# Patient Record
Sex: Male | Born: 1945 | Race: White | Hispanic: No | Marital: Married | Smoking: Former smoker
Health system: Southern US, Community
[De-identification: ages and names within clinical notes are randomized; demographics above are authoritative.]

## PROBLEM LIST (undated history)

## (undated) ENCOUNTER — Emergency Department (HOSPITAL_COMMUNITY): Admission: EM | Payer: Medicare Other | Source: Home / Self Care

## (undated) DIAGNOSIS — I10 Essential (primary) hypertension: Secondary | ICD-10-CM

## (undated) DIAGNOSIS — C801 Malignant (primary) neoplasm, unspecified: Secondary | ICD-10-CM

## (undated) DIAGNOSIS — E7800 Pure hypercholesterolemia, unspecified: Secondary | ICD-10-CM

## (undated) DIAGNOSIS — G8929 Other chronic pain: Secondary | ICD-10-CM

## (undated) DIAGNOSIS — E042 Nontoxic multinodular goiter: Secondary | ICD-10-CM

## (undated) DIAGNOSIS — F419 Anxiety disorder, unspecified: Secondary | ICD-10-CM

## (undated) DIAGNOSIS — I350 Nonrheumatic aortic (valve) stenosis: Secondary | ICD-10-CM

## (undated) DIAGNOSIS — K219 Gastro-esophageal reflux disease without esophagitis: Secondary | ICD-10-CM

## (undated) DIAGNOSIS — M1990 Unspecified osteoarthritis, unspecified site: Secondary | ICD-10-CM

## (undated) DIAGNOSIS — G4730 Sleep apnea, unspecified: Secondary | ICD-10-CM

## (undated) DIAGNOSIS — Z8719 Personal history of other diseases of the digestive system: Secondary | ICD-10-CM

## (undated) DIAGNOSIS — C439 Malignant melanoma of skin, unspecified: Secondary | ICD-10-CM

## (undated) HISTORY — DX: Other chronic pain: G89.29

## (undated) HISTORY — DX: Unspecified osteoarthritis, unspecified site: M19.90

## (undated) HISTORY — DX: Malignant (primary) neoplasm, unspecified: C80.1

## (undated) HISTORY — PX: WRIST SURGERY: SHX841

## (undated) HISTORY — PX: TOTAL KNEE ARTHROPLASTY: SHX125

## (undated) HISTORY — PX: BLADDER SURGERY: SHX569

## (undated) HISTORY — DX: Pure hypercholesterolemia, unspecified: E78.00

## (undated) HISTORY — DX: Essential (primary) hypertension: I10

## (undated) HISTORY — PX: TONSILLECTOMY: SUR1361

## (undated) HISTORY — PX: CATARACT EXTRACTION W/ INTRAOCULAR LENS IMPLANT: SHX1309

## (undated) HISTORY — DX: Gastro-esophageal reflux disease without esophagitis: K21.9

## (undated) HISTORY — DX: Anxiety disorder, unspecified: F41.9

## (undated) HISTORY — PX: SHOULDER SURGERY: SHX246

## (undated) HISTORY — PX: BACK SURGERY: SHX140

## (undated) HISTORY — DX: Malignant melanoma of skin, unspecified: C43.9

## (undated) HISTORY — PX: MELANOMA EXCISION: SHX5266

## (undated) HISTORY — DX: Nonrheumatic aortic (valve) stenosis: I35.0

## (undated) HISTORY — PX: OTHER SURGICAL HISTORY: SHX169

---

## 2001-12-07 ENCOUNTER — Emergency Department (HOSPITAL_COMMUNITY): Admission: EM | Admit: 2001-12-07 | Discharge: 2001-12-07 | Payer: Self-pay | Admitting: Emergency Medicine

## 2010-06-13 ENCOUNTER — Ambulatory Visit (HOSPITAL_COMMUNITY): Admission: RE | Admit: 2010-06-13 | Discharge: 2010-06-13 | Payer: Self-pay | Admitting: Cardiology

## 2010-06-13 ENCOUNTER — Ambulatory Visit: Payer: Self-pay | Admitting: Cardiology

## 2010-11-01 ENCOUNTER — Ambulatory Visit
Admission: RE | Admit: 2010-11-01 | Discharge: 2010-11-02 | Payer: Self-pay | Source: Home / Self Care | Attending: Orthopedic Surgery | Admitting: Orthopedic Surgery

## 2010-11-06 LAB — BASIC METABOLIC PANEL
BUN: 14 mg/dL (ref 6–23)
CO2: 24 mEq/L (ref 19–32)
Calcium: 10.3 mg/dL (ref 8.4–10.5)
Chloride: 105 mEq/L (ref 96–112)
Creatinine, Ser: 0.89 mg/dL (ref 0.4–1.5)
GFR calc Af Amer: >60 mL/min (ref >60)
GFR calc non Af Amer: >60 mL/min (ref >60)
Glucose, Bld: 142 mg/dL — ABNORMAL HIGH (ref 70–99)
Potassium: 4.8 mEq/L (ref 3.5–5.1)
Sodium: 137 mEq/L (ref 135–145)

## 2010-11-06 LAB — POCT HEMOGLOBIN-HEMACUE: Hemoglobin: 16.3 g/dL (ref 13.0–17.0)

## 2010-12-11 NOTE — Op Note (Signed)
NAME:  Steve Hunter, Steve Hunter              ACCOUNT NO.:  [id number]  MEDICAL RECORD NO.:  [id number]          PATIENT TYPE:  AMB  LOCATION:  DSC                          FACILITY:  [facility]  PHYSICIAN:  Cindee Salt, M.D.       DATE OF BIRTH:  1945/11/28  DATE OF PROCEDURE:  11/01/2010 DATE OF DISCHARGE:                              OPERATIVE REPORT   PREOPERATIVE DIAGNOSES: 1. Carpal tunnel syndrome, right hand. 2. Carpometacarpal arthritis, right thumb.  POSTOPERATIVE DIAGNOSES: 1. Carpal tunnel syndrome, right hand. 2. Carpometacarpal arthritis, right thumb.  OPERATION: 1. GraftJacket interposition. 2. Arthroscopic partial trapeziectomy, right thumb carpometacarpal     with open carpal tunnel release.  SURGEON:  Cindee Salt, M.D.  ANESTHESIA:  General.  ANESTHESIOLOGIST:  Zenon Mayo, MD.  HISTORY:  The patient is a 65 year old male with a history of pain in the basal area of the thumb, not responsive to conservative treatment, carpal tunnel syndrome with numbness and tingling, EMG and nerve conductions positive, also nonresponsive to conservative treatment.  He has elected to undergo partial episiotomy with GraftJacket interposition with open carpal tunnel release.  He is aware that there is no guarantee with surgery, possibility of infection; recurrence of injury to arteries, nerves, tendons; incomplete relief of symptoms and dystrophy. In the preoperative area, the patient was seen.  The extremity marked by both patient and surgeon.  Antibiotic given.  PROCEDURE:  The patient was brought to the operating room where a general anesthetic was carried out without difficulty.  Was prepped using ChloraPrep, supine position with the right arm free.  A 3-minute dry time was allowed.  Time-out taken, confirming patient and procedure. He was placed in the Concept arthroscopy tower with traction on the thumb, 5 pounds applied.  The joint was localized with  image intensification.  A 22-gauge needle was used to inflate the joint on either side of the abductor pollicis longus and extensor brevis tendons. A longitudinal incision was made, deepened with a hemostat.  Blunt trocar used to enter the carpometacarpal joint.  The scope and instruments were alternated between the radial and ulnar portals.  A 1.9- mm scope and a 2-mm shaver were introduced.  This was then debrided of loose cartilage.  Significant degenerative changes were noted with loss of the saddle deformity to the trapezium and total loss of the cartilage.  Multiple cartilage fragments were floating within the joint, these were debrided.  The margins of the trapezium were then isolated. A vortex bur was then introduced, and a partial trapeziectomy was performed, taking care to eliminate the large osteophyte between the thumb and index metacarpal.  This required use of small osteotomes, the bur, and shaver alternating until this was visualized under image intensification with adequate resection.  A 2 x 4 mm GraftJacket was then folded, this was then placed into the joint, unraveled, filling the joint with the GraftJacket.  X-rays confirmed positioning of the joint with adequate interposition, with the traction removed.  The portals were then closed with interrupted 5-0 Vicryl Rapide sutures.  The limb was then exsanguinated with an Esmarch bandage, tourniquet placed high on the arm,  was inflated to 250 mmHg.  A longitudinal incision was made and promptly carried down through subcutaneous tissue.  Bleeders were electrocauterized.  Palmar fascia was split, superficial palmar arch identified.  The flexor tendon to the ring little finger identified to the ulnar side of median nerve.  The carpal retinaculum was incised with sharp dissection.  A right-angle and Sewell retractor were placed between skin and forearm fascia.  Fascia was then released for approximately a centimeter and half  proximal to the wrist crease under direct vision.  The canal was explored. Compression to the nerve was apparent.  No further lesions were identified.  Very significant tenosynovitis was present.  The wound was copiously irrigated with saline.  The skin then closed with interrupted 5-0 Vicryl Rapide sutures.  A local infiltration with 0.25% Marcaine was then given to each incision.  The arm was then placed in a dorsal-palmar-radial thumb spica splint.  On deflation of the tourniquet, all fingers immediately pinked.  He was taken to the recovery room for observation in satisfactory condition.  He will be discharged home.  He will return to the [facility] in 1 week, on Percocet.  He is admitted for overnight stay, for pain control; he will be discharged if he is comfortable.          ______________________________ Cindee Salt, M.D.     GK/MEDQ  D:  11/01/2010  T:  11/02/2010  Job:  [id number]  Electronically Signed by Cindee Salt M.D. on 12/11/2010 02:22:58 PM

## 2011-07-03 ENCOUNTER — Encounter: Payer: Self-pay | Admitting: Cardiology

## 2011-07-13 ENCOUNTER — Ambulatory Visit (INDEPENDENT_AMBULATORY_CARE_PROVIDER_SITE_OTHER): Payer: Medicare Other | Admitting: Cardiology

## 2011-07-13 ENCOUNTER — Encounter: Payer: Self-pay | Admitting: Cardiology

## 2011-07-13 VITALS — BP 130/70 | HR 63 | Ht 73.0 in | Wt 226.8 lb

## 2011-07-13 DIAGNOSIS — I350 Nonrheumatic aortic (valve) stenosis: Secondary | ICD-10-CM | POA: Insufficient documentation

## 2011-07-13 DIAGNOSIS — E119 Type 2 diabetes mellitus without complications: Secondary | ICD-10-CM

## 2011-07-13 DIAGNOSIS — I10 Essential (primary) hypertension: Secondary | ICD-10-CM

## 2011-07-13 DIAGNOSIS — I359 Nonrheumatic aortic valve disorder, unspecified: Secondary | ICD-10-CM

## 2011-07-13 DIAGNOSIS — E7800 Pure hypercholesterolemia, unspecified: Secondary | ICD-10-CM

## 2011-07-13 NOTE — Assessment & Plan Note (Signed)
He has mild aortic stenosis by prior echocardiogram. His exam is unchanged. We'll plan on repeating an echocardiogram in about 2 years. He will continue on his current medications. SBE prophylaxis is not required.

## 2011-07-13 NOTE — Patient Instructions (Signed)
Continue your current therapy  I will see you again in 1 year.   

## 2011-07-13 NOTE — Progress Notes (Signed)
Steve Hunter Date of Birth: 1946/08/09   History of Present Illness: He has a history of mild aortic stenosis. His last echocardiogram was in August of 2011. He has done very well this past year. He denies any cardiac complaints. He denies any chest pain, shortness of breath, or palpitations.  Current Outpatient Prescriptions on File Prior to Visit  Medication Sig Dispense Refill  . aspirin 81 MG tablet Take 81 mg by mouth daily.        Marland Kitchen esomeprazole (NEXIUM) 40 MG capsule Take 40 mg by mouth daily.        . finasteride (PROSCAR) 5 MG tablet Take 5 mg by mouth daily.        Marland Kitchen gemfibrozil (LOPID) 600 MG tablet Take 600 mg by mouth 2 (two) times daily.        Marland Kitchen HYDROcodone-acetaminophen (VICODIN) 5-500 MG per tablet Take 1 tablet by mouth as needed.        Marland Kitchen losartan-hydrochlorothiazide (HYZAAR) 100-25 MG per tablet Take 1 tablet by mouth daily.        . meloxicam (MOBIC) 15 MG tablet Take 15 mg by mouth daily.        . pantoprazole (PROTONIX) 40 MG tablet Take 1 tablet by mouth Daily.      . simvastatin (ZOCOR) 80 MG tablet Take 80 mg by mouth daily.        . Tamsulosin HCl (FLOMAX) 0.4 MG CAPS Take by mouth daily.        . traMADol (ULTRAM) 50 MG tablet Take 50 mg by mouth daily.          No Known Allergies  Past Medical History  Diagnosis Date  . Diabetes mellitus   . Hypertension   . Hypercholesterolemia   . Aortic stenosis     Mild  . GERD (gastroesophageal reflux disease)   . Cancer     Bladder  . Melanoma   . Osteoarthritis     Past Surgical History  Procedure Date  . Bladder surgery   . Knee surgery     Left knee  . Wrist surgery     Right wrist  . Melanoma excision   . Carpal tunnel surgery     History  Smoking status  . Former Smoker  . Quit date: 07/02/1980  Smokeless tobacco  . Not on file    History  Alcohol Use  . Yes    Family History  Problem Relation Age of Onset  . Hypertension Mother     Review of Systems: The review of  systems is positive for severe osteoarthritis. This has significantly limited his activity. He had carpal tunnels surgery this year. He states he will need a knee replacement at some point. All other systems were reviewed and are negative.  Physical Exam: BP 130/70  Pulse 63  Ht 6\' 1"  (1.854 m)  Wt 226 lb 12.8 oz (102.876 kg)  BMI 29.92 kg/m2 The patient is alert and oriented x 3.  The mood and affect are normal.  The skin is warm and dry.  Color is normal.  The HEENT exam reveals that the sclera are nonicteric.  The mucous membranes are moist.  The carotids are 2+ without bruits.  There is no thyromegaly.  There is no JVD.  The lungs are clear.  The chest wall is non tender.  The heart exam reveals a regular rate with a normal S1 and S2.  There is a grade 1-2/6 systolic ejection murmur the  right upper sternal border..  The PMI is not displaced.   Abdominal exam reveals good bowel sounds.  There is no guarding or rebound.  There is no hepatosplenomegaly or tenderness.  There are no masses.  Exam of the legs reveal no clubbing, cyanosis, or edema.  The legs are without rashes.  The distal pulses are intact.  Cranial nerves II - XII are intact.  Motor and sensory functions are intact.  The gait is normal.  LABORATORY DATA:   Assessment / Plan:

## 2012-05-28 ENCOUNTER — Telehealth: Payer: Self-pay | Admitting: Cardiology

## 2012-05-28 ENCOUNTER — Other Ambulatory Visit: Payer: Self-pay

## 2012-05-28 DIAGNOSIS — I350 Nonrheumatic aortic (valve) stenosis: Secondary | ICD-10-CM

## 2012-05-28 DIAGNOSIS — I10 Essential (primary) hypertension: Secondary | ICD-10-CM

## 2012-05-28 DIAGNOSIS — E119 Type 2 diabetes mellitus without complications: Secondary | ICD-10-CM

## 2012-05-28 NOTE — Telephone Encounter (Signed)
New problem:  Patient calling would like to be set up for stress test

## 2012-05-28 NOTE — Telephone Encounter (Signed)
Patient's wife called was told spoke to Dr.Jordan he advised lexiscan myoview.Schedulers will call tomorrow 05/29/12 to schedule.

## 2012-05-29 ENCOUNTER — Telehealth: Payer: Self-pay | Admitting: *Deleted

## 2012-05-29 NOTE — Telephone Encounter (Signed)
Left message for patient to call to schedule myoview per Dr. Swaziland.

## 2012-06-04 ENCOUNTER — Ambulatory Visit (HOSPITAL_COMMUNITY): Payer: Medicare Other | Attending: Cardiovascular Disease | Admitting: Radiology

## 2012-06-04 VITALS — BP 120/62 | Ht 73.0 in | Wt 205.0 lb

## 2012-06-04 DIAGNOSIS — R42 Dizziness and giddiness: Secondary | ICD-10-CM | POA: Insufficient documentation

## 2012-06-04 DIAGNOSIS — I350 Nonrheumatic aortic (valve) stenosis: Secondary | ICD-10-CM

## 2012-06-04 DIAGNOSIS — R21 Rash and other nonspecific skin eruption: Secondary | ICD-10-CM

## 2012-06-04 DIAGNOSIS — R079 Chest pain, unspecified: Secondary | ICD-10-CM | POA: Insufficient documentation

## 2012-06-04 DIAGNOSIS — Z87891 Personal history of nicotine dependence: Secondary | ICD-10-CM | POA: Insufficient documentation

## 2012-06-04 DIAGNOSIS — M79671 Pain in right foot: Secondary | ICD-10-CM

## 2012-06-04 DIAGNOSIS — E119 Type 2 diabetes mellitus without complications: Secondary | ICD-10-CM | POA: Insufficient documentation

## 2012-06-04 DIAGNOSIS — I10 Essential (primary) hypertension: Secondary | ICD-10-CM | POA: Insufficient documentation

## 2012-06-04 DIAGNOSIS — R002 Palpitations: Secondary | ICD-10-CM | POA: Insufficient documentation

## 2012-06-04 MED ORDER — AMINOPHYLLINE 25 MG/ML IV SOLN
75.0000 mg | Freq: Once | INTRAVENOUS | Status: AC
  Administered 2012-06-04: 75 mg via INTRAVENOUS

## 2012-06-04 MED ORDER — TECHNETIUM TC 99M TETROFOSMIN IV KIT
30.0000 | PACK | Freq: Once | INTRAVENOUS | Status: AC | PRN
  Administered 2012-06-04: 30 via INTRAVENOUS

## 2012-06-04 MED ORDER — REGADENOSON 0.4 MG/5ML IV SOLN
0.4000 mg | Freq: Once | INTRAVENOUS | Status: AC
  Administered 2012-06-04: 0.4 mg via INTRAVENOUS

## 2012-06-04 MED ORDER — TECHNETIUM TC 99M TETROFOSMIN IV KIT
10.0000 | PACK | Freq: Once | INTRAVENOUS | Status: AC | PRN
  Administered 2012-06-04: 10 via INTRAVENOUS

## 2012-06-04 MED ORDER — DIPHENHYDRAMINE HCL 50 MG/ML IJ SOLN
25.0000 mg | Freq: Once | INTRAMUSCULAR | Status: AC
  Administered 2012-06-04: 25 mg via INTRAVENOUS

## 2012-06-04 NOTE — Progress Notes (Signed)
[facility] SITE 3 NUCLEAR MED [address] [place] [address] [phone]  Cardiology Nuclear Med Study  Steve Hunter is a 66 y.o. male     MRN : [id number]     DOB: [birth year]  Procedure Date: 06/04/2012  Nuclear Med Background Indication for Stress Test:  Evaluation for Ischemia History:  No prior known history of CAD, 4 to 5 yrs ago GXT per patient, and '11 Echo: Result not available Cardiac Risk Factors: History of Smoking, Hypertension, Lipids and NIDDM  Symptoms: Chest Pain with/without exertion (last occurrence 1 month ago),  Dizziness, Light-Headedness and Palpitations   Nuclear Pre-Procedure Caffeine/Decaff Intake:  None NPO After: 8:00am   Lungs:  clear O2 Sat: 96% on room air. IV 0.9% NS with Angio Cath:  20g  IV Site: R Antecubital  IV Started by:  Stanton Kidney, EMT-P  Chest Size (in):  42 Cup Size: n/a  Height: 6\' 1"  (1.854 m)  Weight:  205 lb (92.987 kg)  BMI:  Body mass index is 27.05 kg/(m^2). Tech Comments: The patient complained of persistent tingling and burning in feet, and developed a rash like mosquito bites on chest , back, and (L) knee with itching between fingers post Lexiscan. Aminophylline 75mg  IVP given at 1:56pm with rapid relief of feet pain. Benadryl 25mg  IVP given at 2:00pm per orders by Sunday Spillers with relief of rash symptoms.Irean Hong, RN    Nuclear Med Study 1 or 2 day study: 1 day  Stress Test Type:  Treadmill/Lexiscan  Reading MD: Charlton Haws, MD  Order Authorizing Provider:  Peter Swaziland, MD  Resting Radionuclide: Technetium 79m Tetrofosmin  Resting Radionuclide Dose: 11.0 mCi   Stress Radionuclide:  Technetium 2m Tetrofosmin  Stress Radionuclide Dose: 33.0 mCi           Stress Protocol Rest HR: 58 Stress HR: 118  Rest BP: 120/62 Stress BP: 157/73  Exercise Time (min): 2:00 METS: n/a   Predicted Max HR: 153 bpm % Max HR: 77.12 bpm Rate Pressure Product: 21308   Dose of Adenosine (mg):  n/a Dose  of Lexiscan: 0.4 mg  Dose of Atropine (mg): n/a Dose of Dobutamine: n/a mcg/kg/min (at max HR)  Stress Test Technologist: Irean Hong, RN  Nuclear Technologist:  Domenic Polite, CNMT     Rest Procedure:  Myocardial perfusion imaging was performed at rest 45 minutes following the intravenous administration of Technetium 89m Tetrofosmin. Rest ECG: Sinus bradycardia  Stress Procedure:  The patient received IV Lexiscan 0.4 mg over 15-seconds with concurrent low level exercise and then Technetium 75m Tetrofosmin was injected at 30-seconds while the patient continued walking one more minute. There were no significant changes with Lexiscan. Quantitative spect images were obtained after a 45-minute delay. The patient was asymptomatic after 2nd images with an okay for patient to be discharged by Norma Fredrickson, NP with wife to be the driver. Stress ECG: No significant change from baseline ECG  QPS Raw Data Images:  Normal; no motion artifact; normal heart/lung ratio. Stress Images:  Normal homogeneous uptake in all areas of the myocardium. Rest Images:  Normal homogeneous uptake in all areas of the myocardium. Subtraction (SDS):  No evidence of ischemia. Transient Ischemic Dilatation (Normal <1.22):  1.05 Lung/Heart Ratio (Normal <0.45):  .29  Quantitative Gated Spect Images QGS EDV:  131 ml QGS ESV:  59 ml  Impression Exercise Capacity:  Lexiscan with low level exercise. BP Response:  Normal blood pressure response. Clinical Symptoms:  No significant symptoms noted.  ECG Impression:  No significant ST segment change suggestive of ischemia. Comparison with Prior Nuclear Study: No previous nuclear study performed  Overall Impression:  Normal stress nuclear study.  No evidence of ischemia.  Normal LV function.  LV Ejection Fraction: 55%.  LV Wall Motion:  NL LV Function; NL Wall Motion    Vesta Mixer, Montez Hageman., MD, [facility] 06/04/2012, 5:15 PM Office - [phone] Pager [phone]

## 2012-06-11 ENCOUNTER — Telehealth: Payer: Self-pay | Admitting: Cardiology

## 2012-06-11 NOTE — Telephone Encounter (Signed)
Patient called was told normal myoview.

## 2012-06-11 NOTE — Telephone Encounter (Signed)
Pt rtn call to cheryl from yesterday °

## 2012-07-10 ENCOUNTER — Ambulatory Visit (INDEPENDENT_AMBULATORY_CARE_PROVIDER_SITE_OTHER): Payer: Medicare Other | Admitting: Cardiology

## 2012-07-10 ENCOUNTER — Encounter: Payer: Self-pay | Admitting: Cardiology

## 2012-07-10 VITALS — BP 110/70 | HR 63 | Ht 74.0 in | Wt 208.0 lb

## 2012-07-10 DIAGNOSIS — I10 Essential (primary) hypertension: Secondary | ICD-10-CM

## 2012-07-10 DIAGNOSIS — R0789 Other chest pain: Secondary | ICD-10-CM

## 2012-07-10 DIAGNOSIS — I350 Nonrheumatic aortic (valve) stenosis: Secondary | ICD-10-CM

## 2012-07-10 DIAGNOSIS — I359 Nonrheumatic aortic valve disorder, unspecified: Secondary | ICD-10-CM

## 2012-07-10 NOTE — Patient Instructions (Signed)
Continue your current therapy  I will see you again in one year.   

## 2012-07-10 NOTE — Progress Notes (Signed)
Steve Hunter Date of Birth: [birth year]   History of Present Illness: [name] is seen today for yearly followup. He has a history of mild aortic stenosis. His last echocardiogram was in August of 2011. Over the past several months he had had some minor chest pain. This was fairly fleeting pain running across his chest. It was not clearly related to exertion. It has resolved. He did undergo left total knee replacement in January. Since then he has had more back problems and has a herniated disc. He is scheduled to see neurosurgery next week. He has been watching his diet more carefully and has lost 18 pounds. Because of his chest pain he recently underwent LexiScan Myoview study which was normal. He did have an allergic reaction following this study with a diffuse rash, presumably from the LexiScan.  Current Outpatient Prescriptions on File Prior to Visit  Medication Sig Dispense Refill  . aspirin 81 MG tablet Take 81 mg by mouth daily.        . finasteride (PROSCAR) 5 MG tablet Take 5 mg by mouth daily.        Marland Kitchen gemfibrozil (LOPID) 600 MG tablet Take 600 mg by mouth 2 (two) times daily.        Marland Kitchen HYDROcodone-acetaminophen (VICODIN) 5-500 MG per tablet Take 1 tablet by mouth as needed.        Marland Kitchen losartan-hydrochlorothiazide (HYZAAR) 100-25 MG per tablet Take 1 tablet by mouth daily.        . meloxicam (MOBIC) 15 MG tablet Take 15 mg by mouth daily.        . pantoprazole (PROTONIX) 40 MG tablet Take 1 tablet by mouth Daily.      . simvastatin (ZOCOR) 80 MG tablet Take 40 mg by mouth daily.       . Tamsulosin HCl (FLOMAX) 0.4 MG CAPS Take by mouth daily.        . traMADol (ULTRAM) 50 MG tablet Take 50 mg by mouth daily. As needed      . zolpidem (AMBIEN) 10 MG tablet as directed.        Allergies  Allergen Reactions  . Lexiscan (Regadenoson) Itching and Rash    Patient developed mosquite bite type rash on chest, back and leg post lexiscan.    Past Medical History  Diagnosis Date  .  Diabetes mellitus   . Hypertension   . Hypercholesterolemia   . Aortic stenosis     Mild  . GERD (gastroesophageal reflux disease)   . Cancer     Bladder  . Melanoma   . Osteoarthritis     Past Surgical History  Procedure Date  . Bladder surgery   . Wrist surgery     Right wrist  . Melanoma excision   . Carpal tunnel surgery   . Total knee arthroplasty     left    History  Smoking status  . Former Smoker  . Quit date: 07/02/1980  Smokeless tobacco  . Not on file    History  Alcohol Use  . Yes    Family History  Problem Relation Age of Onset  . Hypertension Mother     Review of Systems: The review of systems is positive for severe osteoarthritis.  All other systems were reviewed and are negative.  Physical Exam: BP 110/70  Pulse 63  Ht 6\' 2"  (1.88 m)  Wt 208 lb (94.348 kg)  BMI 26.71 kg/m2 He is a pleasant white male in no acute distress. The  skin is warm and dry.  Color is normal.  The HEENT exam reveals that the sclera are nonicteric.  The mucous membranes are moist.  The carotids are 2+ without bruits.  There is no thyromegaly.  There is no JVD.  The lungs are clear.  The chest wall is non tender.  The heart exam reveals a regular rate with a normal S1 and S2.  There is a grade 1-2/6 systolic ejection murmur the right upper sternal border..  The PMI is not displaced.   Abdominal exam reveals good bowel sounds.  There is no guarding or rebound.  There is no hepatosplenomegaly or tenderness.  There are no masses.  Exam of the legs reveal no clubbing, cyanosis, or edema.  The distal pulses are intact.  Cranial nerves II - XII are intact.  Motor and sensory functions are intact.  The gait is normal.  LABORATORY DATA: ECG today demonstrates normal sinus rhythm with left axis deviation. It is otherwise normal.  Assessment / Plan: 1. Atypical chest pain. He has a normal Myoview study. We will continue with risk factor modification.  2. Mild aortic stenosis. We  will just follow up again in one year.  3. Hypertension, controlled.  4. Hyperlipidemia, on a combination of simvastatin and gemfibrozil.  5. Diabetes mellitus type 2. I've encouraged him with his weight loss efforts.

## 2012-07-15 ENCOUNTER — Ambulatory Visit: Payer: Medicare Other | Admitting: Cardiology

## 2012-12-31 ENCOUNTER — Telehealth: Payer: Self-pay | Admitting: Cardiology

## 2012-12-31 NOTE — Telephone Encounter (Signed)
Patient called spoke to wife she stated husband's last stress test he had done he was allergic and wanted to know name.Steve Hunter is name of last myoview and he had allergic reaction itching,rash.

## 2012-12-31 NOTE — Telephone Encounter (Signed)
Pt had nuc stress and was allergic to something he was injected with, thinks it started with an L, needs to know the name of it pls

## 2013-08-20 DIAGNOSIS — C679 Malignant neoplasm of bladder, unspecified: Secondary | ICD-10-CM | POA: Insufficient documentation

## 2014-06-18 ENCOUNTER — Encounter: Payer: Self-pay | Admitting: Cardiology

## 2014-06-18 ENCOUNTER — Ambulatory Visit (INDEPENDENT_AMBULATORY_CARE_PROVIDER_SITE_OTHER): Payer: Medicare Other | Admitting: Cardiology

## 2014-06-18 VITALS — BP 114/62 | HR 64 | Ht 73.0 in | Wt 207.7 lb

## 2014-06-18 DIAGNOSIS — I10 Essential (primary) hypertension: Secondary | ICD-10-CM

## 2014-06-18 DIAGNOSIS — I350 Nonrheumatic aortic (valve) stenosis: Secondary | ICD-10-CM

## 2014-06-18 DIAGNOSIS — I359 Nonrheumatic aortic valve disorder, unspecified: Secondary | ICD-10-CM

## 2014-06-18 DIAGNOSIS — E7800 Pure hypercholesterolemia, unspecified: Secondary | ICD-10-CM

## 2014-06-18 DIAGNOSIS — E119 Type 2 diabetes mellitus without complications: Secondary | ICD-10-CM

## 2014-06-18 NOTE — Patient Instructions (Signed)
Continue your current therapy  I will get a copy of your lab work  Continue to exercise regularly.

## 2014-06-18 NOTE — Progress Notes (Signed)
[name] Date of Birth: March 31, 1946   History of Present Illness: [name] is seen today for yearly followup. He has a history of mild aortic stenosis. His last echocardiogram was in August of 2011. He has a  LexiScan Myoview study in 2013 which was normal. He did have an allergic reaction following this study with a diffuse rash, presumably from the [place]. He states he has been diagnosed with DM. He is on oral therapy. He states he has lost 30 lbs and is feeling much better. Denies chest pain or SOB. Is following a low carb diet. Did have some dizziness with sugars were out of control but this has resolved.  Current Outpatient Prescriptions on File Prior to Visit  Medication Sig Dispense Refill  . aspirin 81 MG tablet Take 81 mg by mouth daily.        . finasteride (PROSCAR) 5 MG tablet Take 5 mg by mouth daily.        Marland Kitchen gemfibrozil (LOPID) 600 MG tablet Take 600 mg by mouth 2 (two) times daily.        Marland Kitchen HYDROcodone-acetaminophen (VICODIN) 5-500 MG per tablet Take 1 tablet by mouth as needed.        Marland Kitchen losartan-hydrochlorothiazide (HYZAAR) 100-25 MG per tablet Take 1 tablet by mouth daily.        . meloxicam (MOBIC) 15 MG tablet Take 15 mg by mouth daily.        . pantoprazole (PROTONIX) 40 MG tablet Take 1 tablet by mouth Daily.      . Tamsulosin HCl (FLOMAX) 0.4 MG CAPS Take by mouth daily.        . traMADol (ULTRAM) 50 MG tablet Take 50 mg by mouth daily. As needed      . zolpidem (AMBIEN) 10 MG tablet as directed.       No current facility-administered medications on file prior to visit.    Allergies  Allergen Reactions  . Lexiscan [Regadenoson] Itching and Rash    Patient developed mosquite bite type rash on chest, back and leg post lexiscan.    Past Medical History  Diagnosis Date  . Diabetes mellitus   . Hypertension   . Hypercholesterolemia   . Aortic stenosis     Mild  . GERD (gastroesophageal reflux disease)   . Cancer     Bladder  . Melanoma   .  Osteoarthritis     Past Surgical History  Procedure Laterality Date  . Bladder surgery    . Wrist surgery      Right wrist  . Melanoma excision    . Carpal tunnel surgery    . Total knee arthroplasty      left    History  Smoking status  . Former Smoker  . Quit date: 07/02/1980  Smokeless tobacco  . Not on file    History  Alcohol Use  . Yes    Family History  Problem Relation Age of Onset  . Hypertension Mother     Review of Systems: As noted in HPI.  All other systems were reviewed and are negative.  Physical Exam: BP 114/62  Pulse 64  Ht 6\' 1"  (1.854 m)  Wt 207 lb 11.2 oz (94.212 kg)  BMI 27.41 kg/m2 He is a pleasant white male in no acute distress. The skin is warm and dry.  Color is normal.  The HEENT exam reveals that the sclera are nonicteric.  The mucous membranes are moist.  The carotids are 2+  without bruits.  There is no thyromegaly.  There is no JVD.  The lungs are clear.   The heart exam reveals a regular rate with a normal S1 and S2.  There is a grade 1/6 systolic ejection murmur the right upper sternal border..  The PMI is not displaced.   Abdominal exam reveals good bowel sounds.  There is no hepatosplenomegaly or tenderness.  There are no masses.  Exam of the legs reveal no clubbing, cyanosis, or edema.  The distal pulses are intact.  Cranial nerves II - XII are intact.  Motor and sensory functions are intact.  The gait is normal.  LABORATORY DATA: ECG today demonstrates normal sinus rhythm with normal Ecg.  Assessment / Plan: 1. History of atypical chest pain. He had a normal Myoview study. Symptoms resolved.  2. Mild aortic stenosis. We will just follow up again in one year.  3. Hypertension, controlled.  4. Hyperlipidemia, on a combination of simvastatin and gemfibrozil.  5. Diabetes mellitus type 2. On oral therapy with improvement.  Continue risk factor modification. Follow up in one year.

## 2015-06-24 ENCOUNTER — Ambulatory Visit (INDEPENDENT_AMBULATORY_CARE_PROVIDER_SITE_OTHER): Payer: PPO | Admitting: Cardiology

## 2015-06-24 ENCOUNTER — Encounter: Payer: Self-pay | Admitting: Cardiology

## 2015-06-24 VITALS — BP 104/64 | HR 69 | Ht 73.5 in | Wt 217.9 lb

## 2015-06-24 DIAGNOSIS — R0789 Other chest pain: Secondary | ICD-10-CM

## 2015-06-24 DIAGNOSIS — E7800 Pure hypercholesterolemia, unspecified: Secondary | ICD-10-CM

## 2015-06-24 DIAGNOSIS — I10 Essential (primary) hypertension: Secondary | ICD-10-CM | POA: Diagnosis not present

## 2015-06-24 DIAGNOSIS — I350 Nonrheumatic aortic (valve) stenosis: Secondary | ICD-10-CM | POA: Diagnosis not present

## 2015-06-24 NOTE — Progress Notes (Signed)
Steve Hunter: 18-Mar-1946   History of Present Illness: [name] is seen today for follow up. He has a history of mild aortic stenosis. His last echocardiogram was in August of 2011. He has a  LexiScan Myoview study in 2013 which was normal. He did have an allergic reaction following this study with a diffuse rash, presumably from the [place]. He has a history of HTN, DM, and HL. Denies chest pain or SOB. Is following a low carb diet. Complains of leg weakness and back pain. Worse in early am and with walking. Not able to walk as much due to pain in right knee- prior TKR.  Current Outpatient Prescriptions on File Prior to Visit  Medication Sig Dispense Refill  . aspirin 81 MG tablet Take 81 mg by mouth daily.      Marland Kitchen atorvastatin (LIPITOR) 40 MG tablet Take 40 mg by mouth daily.    . finasteride (PROSCAR) 5 MG tablet Take 5 mg by mouth daily.      Marland Kitchen gemfibrozil (LOPID) 600 MG tablet Take 600 mg by mouth 2 (two) times daily.      Marland Kitchen HYDROcodone-acetaminophen (VICODIN) 5-500 MG per tablet Take 1 tablet by mouth as needed.      Marland Kitchen losartan-hydrochlorothiazide (HYZAAR) 100-25 MG per tablet Take 1 tablet by mouth daily.      . meloxicam (MOBIC) 15 MG tablet Take 15 mg by mouth daily.      . metFORMIN (GLUCOPHAGE) 500 MG tablet Take 1,000 mg by mouth daily with supper.    . pantoprazole (PROTONIX) 40 MG tablet Take 1 tablet by mouth Daily.    . Tamsulosin HCl (FLOMAX) 0.4 MG CAPS Take by mouth daily.      Marland Kitchen zolpidem (AMBIEN) 10 MG tablet as directed.     No current facility-administered medications on file prior to visit.    Allergies  Allergen Reactions  . Lexiscan [Regadenoson] Itching and Rash    Patient developed mosquite bite type rash on chest, back and leg post lexiscan.    Past Medical History  Diagnosis Date  . Diabetes mellitus   . Hypertension   . Hypercholesterolemia   . Aortic stenosis     Mild  . GERD (gastroesophageal reflux disease)   . Cancer    Bladder  . Melanoma   . Osteoarthritis     Past Surgical History  Procedure Laterality Date  . Bladder surgery    . Wrist surgery      Right wrist  . Melanoma excision    . Carpal tunnel surgery    . Total knee arthroplasty      left    History  Smoking status  . Former Smoker  . Quit date: 07/02/1980  Smokeless tobacco  . Not on file    History  Alcohol Use  . Yes    Family History  Problem Relation Age of Onset  . Hypertension Mother     Review of Systems: As noted in HPI.  All other systems were reviewed and are negative.  Physical Exam: BP 104/64 mmHg  Pulse 69  Ht 6' 1.5" (1.867 m)  Wt 98.839 kg (217 lb 14.4 oz)  BMI 28.36 kg/m2 He is a pleasant white male in no acute distress. The skin is warm and dry.  Color is normal.  The HEENT exam reveals that the sclera are nonicteric.  The mucous membranes are moist.  The carotids are 2+ without bruits.  There is no thyromegaly.  There is no JVD.  The lungs are clear.   The heart exam reveals a regular rate with a normal S1 and S2.  There is a grade 1/6 systolic ejection murmur the right upper sternal border.  The PMI is not displaced.   Abdominal exam reveals good bowel sounds.  There is no hepatosplenomegaly or tenderness.  There are no masses.  Exam of the legs reveal no clubbing, cyanosis, or edema.  The distal pulses are good.  Cranial nerves II - XII are intact.  Motor and sensory functions are intact.  The gait is normal.  LABORATORY DATA: ECG today demonstrates normal sinus rhythm with LAD. Otherwise normal. I have personally reviewed and interpreted this study.   Assessment / Plan: 1.  Mild aortic stenosis. Last evaluation 5 yrs ago. Will update Echo.  3. Hypertension, controlled.  4. Hyperlipidemia, on a combination of simvastatin and gemfibrozil. Labs followed by primary care.   5. Diabetes mellitus type 2. On oral therapy with improvement.  Continue risk factor modification. Follow up in one year.

## 2015-06-24 NOTE — Patient Instructions (Signed)
We will schedule you for an Echocardiogram  Continue your other therapy  I will see you in one year.

## 2015-06-29 ENCOUNTER — Ambulatory Visit: Payer: PPO

## 2015-06-29 ENCOUNTER — Ambulatory Visit (INDEPENDENT_AMBULATORY_CARE_PROVIDER_SITE_OTHER): Payer: PPO | Admitting: Podiatry

## 2015-06-29 ENCOUNTER — Encounter: Payer: Self-pay | Admitting: Podiatry

## 2015-06-29 ENCOUNTER — Ambulatory Visit (INDEPENDENT_AMBULATORY_CARE_PROVIDER_SITE_OTHER): Payer: PPO

## 2015-06-29 VITALS — BP 129/81 | HR 76 | Resp 16

## 2015-06-29 DIAGNOSIS — M79674 Pain in right toe(s): Secondary | ICD-10-CM

## 2015-06-29 DIAGNOSIS — M79675 Pain in left toe(s): Secondary | ICD-10-CM

## 2015-06-29 DIAGNOSIS — M779 Enthesopathy, unspecified: Secondary | ICD-10-CM | POA: Diagnosis not present

## 2015-06-29 DIAGNOSIS — L84 Corns and callosities: Secondary | ICD-10-CM | POA: Diagnosis not present

## 2015-06-29 NOTE — Progress Notes (Signed)
   Subjective:    Patient ID: Steve Hunter, male    DOB: December 01, 1945, 69 y.o.   MRN: [id number]  HPI Pt presents with painful corn interdigital 4th and 5th toe. Also c/o thickening left great nail, has tried lamisil topical   Review of Systems  Gastrointestinal: Positive for abdominal distention.  Musculoskeletal: Positive for back pain, arthralgias and gait problem.  All other systems reviewed and are negative.      Objective:   Physical Exam        Assessment & Plan:

## 2015-06-29 NOTE — Progress Notes (Signed)
Subjective:     Patient ID: Steve Hunter, male   DOB: 06-12-1946, 69 y.o.   MRN: [id number]  HPI patient presents stating I have a lot of pain between the fourth and fifth toes on my right foot that's been present for about a year. I went to a dermatologist to try to freeze it and it did not help me and I try to trim it myself also concerned about discoloration on my left big toe nail   Review of Systems  All other systems reviewed and are negative.      Objective:   Physical Exam  Constitutional: He is oriented to person, place, and time.  Cardiovascular: Intact distal pulses.   Musculoskeletal: Normal range of motion.  Neurological: He is oriented to person, place, and time.  Skin: Skin is warm and dry.  Nursing note and vitals reviewed.  neurovascular status intact muscle strength adequate with range of motion within normal limits. Patient's noted to have good digital flow and is noted to have adequate hair growth. Patient has a keratotic lesion between the fourth and fifth toes on the right foot with inflammation noted but no drainage or odor emitting from this area     Assessment:     Interdigital callus fourth interspace right with inflammatory changes    Plan:     H&P and x-rays reviewed. Today I did a careful interdigital injection of the inflamed tissue 3 mg [place] some Kenalog 5 mg Xylocaine and after appropriate numbness debrided the area and applied dressing. Instructed on soaks and reappoint when recur and may ultimately require surgical soft tissue syndactylization with arthroplasty

## 2015-07-04 ENCOUNTER — Other Ambulatory Visit: Payer: Self-pay

## 2015-07-04 ENCOUNTER — Ambulatory Visit (HOSPITAL_COMMUNITY): Payer: PPO | Attending: Cardiovascular Disease

## 2015-07-04 DIAGNOSIS — E119 Type 2 diabetes mellitus without complications: Secondary | ICD-10-CM | POA: Insufficient documentation

## 2015-07-04 DIAGNOSIS — Z87891 Personal history of nicotine dependence: Secondary | ICD-10-CM | POA: Insufficient documentation

## 2015-07-04 DIAGNOSIS — E7800 Pure hypercholesterolemia, unspecified: Secondary | ICD-10-CM

## 2015-07-04 DIAGNOSIS — R0789 Other chest pain: Secondary | ICD-10-CM | POA: Diagnosis not present

## 2015-07-04 DIAGNOSIS — E785 Hyperlipidemia, unspecified: Secondary | ICD-10-CM | POA: Insufficient documentation

## 2015-07-04 DIAGNOSIS — I10 Essential (primary) hypertension: Secondary | ICD-10-CM | POA: Diagnosis not present

## 2015-07-04 DIAGNOSIS — I350 Nonrheumatic aortic (valve) stenosis: Secondary | ICD-10-CM | POA: Diagnosis not present

## 2016-02-23 DIAGNOSIS — G8929 Other chronic pain: Secondary | ICD-10-CM | POA: Insufficient documentation

## 2016-02-23 DIAGNOSIS — G4700 Insomnia, unspecified: Secondary | ICD-10-CM | POA: Insufficient documentation

## 2016-02-23 DIAGNOSIS — N138 Other obstructive and reflux uropathy: Secondary | ICD-10-CM | POA: Insufficient documentation

## 2016-02-23 DIAGNOSIS — I10 Essential (primary) hypertension: Secondary | ICD-10-CM | POA: Insufficient documentation

## 2016-02-23 DIAGNOSIS — N401 Enlarged prostate with lower urinary tract symptoms: Secondary | ICD-10-CM

## 2016-02-23 DIAGNOSIS — E663 Overweight: Secondary | ICD-10-CM | POA: Insufficient documentation

## 2016-02-23 DIAGNOSIS — M549 Dorsalgia, unspecified: Secondary | ICD-10-CM

## 2016-02-23 DIAGNOSIS — K219 Gastro-esophageal reflux disease without esophagitis: Secondary | ICD-10-CM | POA: Insufficient documentation

## 2016-02-23 DIAGNOSIS — M159 Polyosteoarthritis, unspecified: Secondary | ICD-10-CM | POA: Insufficient documentation

## 2016-02-23 DIAGNOSIS — E119 Type 2 diabetes mellitus without complications: Secondary | ICD-10-CM | POA: Insufficient documentation

## 2016-02-23 DIAGNOSIS — E785 Hyperlipidemia, unspecified: Secondary | ICD-10-CM | POA: Insufficient documentation

## 2016-03-26 DIAGNOSIS — M48061 Spinal stenosis, lumbar region without neurogenic claudication: Secondary | ICD-10-CM | POA: Insufficient documentation

## 2016-04-18 DIAGNOSIS — Z981 Arthrodesis status: Secondary | ICD-10-CM | POA: Insufficient documentation

## 2016-07-25 ENCOUNTER — Ambulatory Visit (INDEPENDENT_AMBULATORY_CARE_PROVIDER_SITE_OTHER): Payer: Medicare Other | Admitting: Sports Medicine

## 2016-07-25 ENCOUNTER — Encounter: Payer: Self-pay | Admitting: Sports Medicine

## 2016-07-25 VITALS — BP 136/74 | Ht 74.0 in | Wt 217.0 lb

## 2016-07-25 DIAGNOSIS — M25512 Pain in left shoulder: Secondary | ICD-10-CM

## 2016-07-25 MED ORDER — METHYLPREDNISOLONE ACETATE 40 MG/ML IJ SUSP
40.0000 mg | Freq: Once | INTRAMUSCULAR | Status: AC
  Administered 2016-07-25: 40 mg via INTRA_ARTICULAR

## 2016-07-25 NOTE — Progress Notes (Signed)
   Subjective:    Patient ID: Steve Hunter, male    DOB: 04/07/1946, 70 y.o.   MRN: [id number]  HPI  Steve Hunter is 70 y.o. male who presents with L shoulder pain x2-3 weeks. Pain is present with movements such as turning his shoulder or reaching high over his head. Denies pain at rest. Denies trauma to the area such as a fall. Reports pain started after trimming some azaleas; however he notes that he is right hand dominant so was just using the left arm as a guide. Has tried icy hot with little relief.   Review of Systems  Denies edema, redness, or tenderness to palpation of the left shoulder.      Objective:   Physical Exam Vitals:   07/25/16 0848  BP: 136/74   Gen: alert and oriented, NAD MSK: No obvious edema or erythema of the left shoulder. No TTP or joint deformity of the left shoulder. Active ROM of left shoulder is limited in internal rotation, otherwise preserved but motion causes pain.  Neuro: +Empty can test of left shoulder indicating supraspinatus weakness (4/5 strength of left compared to 5/5 of right). Strength 4/5 left infraspinatus and teres minor; 5/5 on right. Subscapularis strength full and intact bilaterally.      Assessment & Plan:   Left Shoulder Pain: Weakness of supraspinatus, infraspinatus and teres minor is concerning for rotator cuff impingement and tendonitis. Possible that patient has a minor rotator cuff tear that has been developing over time and recently became symptomatic.  -corticosteroid injection performed today  -gentle stretching exercises reviewed to prevent frozen shoulder  -patient to return in 3 weeks if still symptomatic; if weakness of rotator cuff muscles still present would perform further work up with imaging    Consent obtained and verified. Sterile betadine prep. Furthur cleansed with alcohol. Topical analgesic spray: Ethyl chloride. Joint: Left Shoulder, Subacromial  Approached in typical fashion with:  Completed without  difficulty Meds: 3cc of 1% Xylocaine and 1cc of Depo-Medrol 40 mg  Needle: 25 gauge 1.5  Aftercare instructions and Red flags advised.  Phill Myron, D.O. 07/25/2016, 10:45 AM PGY-2, [place]  Patient seen and evaluated with the resident. I agree with the above plan of care. Left shoulder subacromial cortisone injection was administered today without difficulty. Patient will follow-up in one month for reevaluation. He definitely has weakness with rotator cuff stressing so he may have a small degenerative rotator cuff tear. If pain or weakness persists at follow-up then we will get some imaging, initially in the form of an ultrasound.

## 2016-08-15 ENCOUNTER — Ambulatory Visit (INDEPENDENT_AMBULATORY_CARE_PROVIDER_SITE_OTHER): Payer: Medicare Other | Admitting: Sports Medicine

## 2016-08-15 ENCOUNTER — Other Ambulatory Visit: Payer: Self-pay | Admitting: *Deleted

## 2016-08-15 ENCOUNTER — Encounter: Payer: Self-pay | Admitting: Sports Medicine

## 2016-08-15 ENCOUNTER — Ambulatory Visit
Admission: RE | Admit: 2016-08-15 | Discharge: 2016-08-15 | Disposition: A | Discharge status: Home / Self Care | Payer: Medicare Other | Source: Ambulatory Visit | Attending: Sports Medicine | Admitting: Sports Medicine

## 2016-08-15 VITALS — BP 145/82 | HR 66 | Ht 74.0 in | Wt 217.0 lb

## 2016-08-15 DIAGNOSIS — M25512 Pain in left shoulder: Secondary | ICD-10-CM

## 2016-08-15 NOTE — Progress Notes (Signed)
   Subjective:    Patient ID: Steve Hunter, male    DOB: 12/18/1945, 70 y.o.   MRN: [id number]  HPI   Patient comes in today for follow-up on left shoulder pain. Unfortunately, a recent subacromial cortisone injection did not offer him much pain relief. He has regained some range of motion but his pain persists. It is along the lateral aspect of his shoulder. Most noticeable when reaching out away from his body to pick up heavy objects or when reaching around behind his back. Pain will occasionally awaken him at night as well. He denies any numbness or tingling.    Review of Systems     Objective:   Physical Exam  Well-developed, well-nourished. No acute distress. Vital signs reviewed  Left shoulder: Full range of motion with a positive painful arc. No tenderness over the [facility] joint. No tenderness over the bicipital groove. He has 4+/5 strength with resisted supraspinatus and resisted external rotation. 5/5 strength with resisted internal rotation. Positive empty can, positive [place]. Neurovascularly intact distally.  X-rays of his left shoulder including AP and outlet views shows spurring at the before meals joint. Otherwise unremarkable  MS[facility] ultrasound of his left shoulder was performed. This was a limited study. Supraspinatus has a rather large hypoechoic area at the musculotendinous junction which is consistent with a probable tear here. Infraspinatus appears to be unremarkable.       Assessment & Plan:   Persistent left shoulder pain likely secondary to rotator cuff tear  Physical exam and ultrasound findings suggest a tear of the supraspinatus tendon. I would like to investigate this further with an MRI. He had lumbar spinal fusion in June of this year so we will need to make sure that he is able to have an MRI. If so, I will call him with those findings once available. He would be interested in speaking with Dr. Mardelle Matte about surgical options if a tear is confirmed.

## 2016-08-28 ENCOUNTER — Ambulatory Visit
Admission: RE | Admit: 2016-08-28 | Discharge: 2016-08-28 | Disposition: A | Discharge status: Home / Self Care | Payer: Medicare Other | Source: Ambulatory Visit | Attending: Sports Medicine | Admitting: Sports Medicine

## 2016-08-28 DIAGNOSIS — M25512 Pain in left shoulder: Secondary | ICD-10-CM

## 2016-08-29 ENCOUNTER — Telehealth: Payer: Self-pay | Admitting: Sports Medicine

## 2016-08-29 NOTE — Telephone Encounter (Signed)
I spoke with the patient on the phone today after reviewing the MRI that he recently had done on his left shoulder. 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 There is no obvious atrophy. I would like to refer the patient to Dr. Mardelle Matte to discuss further treatment options. Patient is in agreement with that plan. I will defer further workup and treatment to the discretion of Dr. Mardelle Matte and the patient will follow-up with me as needed.

## 2016-08-30 NOTE — Telephone Encounter (Signed)
[facility] Dr. Mardelle Matte Monday 09/03/16 at 12:15pm [place] [address] [phone]

## 2016-09-06 ENCOUNTER — Telehealth: Payer: Self-pay

## 2016-09-06 NOTE — Telephone Encounter (Signed)
Received surgical clearance from [place].Dr.Jordan cleared patient for upcoming surgery.Form faxed back to fax # [phone].

## 2016-09-29 NOTE — Progress Notes (Signed)
[name] Date of Birth: November 12, 1945   History of Present Illness: [name] is seen today for follow up. He has a history of very mild aortic stenosis. His last echocardiogram was in September 2016. He has a  LexiScan Myoview study in 2013 which was normal. He did have an allergic reaction following this study with a diffuse rash, presumably from the [place]. He has a history of HTN, DM, and HL. In June he had to have L4-S1 fusion. This was without complication. Denies chest pain or SOB. Overall feels very well.  Current Outpatient Prescriptions on File Prior to Visit  Medication Sig Dispense Refill  . ACCU-CHEK AVIVA PLUS test strip     . ACCU-CHEK SOFTCLIX LANCETS lancets     . aspirin 81 MG tablet Take 81 mg by mouth daily.      Marland Kitchen atorvastatin (LIPITOR) 40 MG tablet Take 40 mg by mouth daily.    . cyclobenzaprine (FLEXERIL) 10 MG tablet     . finasteride (PROSCAR) 5 MG tablet Take 5 mg by mouth daily.      Marland Kitchen gemfibrozil (LOPID) 600 MG tablet Take 600 mg by mouth 2 (two) times daily.      Marland Kitchen HYDROcodone-acetaminophen (VICODIN) 5-500 MG per tablet Take 1 tablet by mouth as needed.      . meloxicam (MOBIC) 15 MG tablet Take 15 mg by mouth daily.      . metFORMIN (GLUCOPHAGE) 500 MG tablet Take 1,000 mg by mouth daily with supper.    . pantoprazole (PROTONIX) 40 MG tablet Take 1 tablet by mouth Daily.    . polyethylene glycol powder (GLYCOLAX/MIRALAX) powder Take 1 Container by mouth as needed.    . Tamsulosin HCl (FLOMAX) 0.4 MG CAPS Take by mouth daily.      Marland Kitchen zolpidem (AMBIEN) 10 MG tablet as directed.    Marland Kitchen losartan-hydrochlorothiazide (HYZAAR) 100-25 MG per tablet Take 1 tablet by mouth daily.       No current facility-administered medications on file prior to visit.     Allergies  Allergen Reactions  . Lexiscan [Regadenoson] Itching and Rash    Patient developed mosquite bite type rash on chest, back and leg post lexiscan.  . Other Itching and Rash    LEXICAM    Past  Medical History:  Diagnosis Date  . Aortic stenosis    Mild  . Cancer [facility])    Bladder  . Diabetes mellitus   . GERD (gastroesophageal reflux disease)   . Hypercholesterolemia   . Hypertension   . Melanoma ([place])   . Osteoarthritis     Past Surgical History:  Procedure Laterality Date  . BLADDER SURGERY    . carpal tunnel surgery    . MELANOMA EXCISION    . TOTAL KNEE ARTHROPLASTY     left  . WRIST SURGERY     Right wrist    History  Smoking Status  . Former Smoker  . Quit date: 07/02/1980  Smokeless Tobacco  . Not on file    History  Alcohol Use  . Yes    Family History  Problem Relation Age of Onset  . Hypertension Mother     Review of Systems: As noted in HPI.  All other systems were reviewed and are negative.  Physical Exam: BP 122/74   Pulse 76   Ht 6\' 2"  (1.88 m)   Wt 218 lb (98.9 kg)   BMI 27.99 kg/m  He is a pleasant white male in no acute  distress. The skin is warm and dry.  Color is normal.  The HEENT exam reveals that the sclera are nonicteric.  The mucous membranes are moist.  The carotids are 2+ without bruits.  There is no thyromegaly.  There is no JVD.  The lungs are clear.   The heart exam reveals a regular rate with a normal S1 and S2.  There is a grade 1/6 systolic ejection murmur the right upper sternal border.  The PMI is not displaced.   Abdominal exam reveals good bowel sounds.  There is no hepatosplenomegaly or tenderness.  There are no masses.  Exam of the legs reveal no clubbing, cyanosis, or edema.  The distal pulses are good.  Cranial nerves II - XII are intact.  Motor and sensory functions are intact.  The gait is normal.  LABORATORY DATA:  Lab Results  Component Value Date   HGB 16.3 11/01/2010   GLUCOSE 142 (H) 10/30/2010   NA 137 10/30/2010   K 4.8 10/30/2010   CL 105 10/30/2010   CREATININE 0.89 10/30/2010   BUN 14 10/30/2010   CO2 24 10/30/2010   Labs dated 08/22/16: sodium 131. Other chemistries normal. In August  2017. A1c 6%. LDL 74. LFTs normal.   Echo: 07/04/15: Study Conclusions  - Left ventricle: The cavity size was normal. Wall thickness was   increased in a pattern of mild LVH. Systolic function was normal.   The estimated ejection fraction was in the range of 55% to 60%.   Wall motion was normal; there were no regional wall motion   abnormalities. Doppler parameters are consistent with abnormal   left ventricular relaxation (grade 1 diastolic dysfunction). - Aortic valve: There was very mild stenosis.  Assessment / Plan: 1.  Mild aortic stenosis. Very mild by last Echo. Repeat 5 years.  3. Hypertension, controlled.  4. Hyperlipidemia, on a combination of simvastatin and gemfibrozil. Labs followed by primary care.   5. Diabetes mellitus type 2. On oral therapy with improvement.  6. Hyponatremia. HCTZ reduced. If low sodium level worsens will need to stop HCTZ.  Continue risk factor modification. Follow up in one year.

## 2016-10-01 ENCOUNTER — Ambulatory Visit (INDEPENDENT_AMBULATORY_CARE_PROVIDER_SITE_OTHER): Payer: Medicare Other | Admitting: Cardiology

## 2016-10-01 ENCOUNTER — Encounter: Payer: Self-pay | Admitting: Cardiology

## 2016-10-01 VITALS — BP 122/74 | HR 76 | Ht 74.0 in | Wt 218.0 lb

## 2016-10-01 DIAGNOSIS — I10 Essential (primary) hypertension: Secondary | ICD-10-CM | POA: Diagnosis not present

## 2016-10-01 DIAGNOSIS — E7800 Pure hypercholesterolemia, unspecified: Secondary | ICD-10-CM | POA: Diagnosis not present

## 2016-10-01 DIAGNOSIS — I350 Nonrheumatic aortic (valve) stenosis: Secondary | ICD-10-CM | POA: Diagnosis not present

## 2016-10-01 NOTE — Patient Instructions (Signed)
Continue your current therapy  I will see you in one year   

## 2017-02-20 DIAGNOSIS — Z0000 Encounter for general adult medical examination without abnormal findings: Secondary | ICD-10-CM | POA: Insufficient documentation

## 2017-10-16 DIAGNOSIS — C439 Malignant melanoma of skin, unspecified: Secondary | ICD-10-CM | POA: Insufficient documentation

## 2017-10-16 DIAGNOSIS — C801 Malignant (primary) neoplasm, unspecified: Secondary | ICD-10-CM | POA: Insufficient documentation

## 2017-10-31 NOTE — Progress Notes (Signed)
Steve Hunter Current Date of Birth: Jun 14, 1946   History of Present Illness: Steve Hunter is seen today for follow up. He has a history of very mild aortic stenosis. His last echocardiogram was in September 2016. He has a  LexiScan Myoview study in 2013 which was normal. He did have an allergic reaction following this study with a diffuse rash, presumably from the [place]. He has a history of HTN, DM, and HL. In June 2017 he had L4-S1 fusion.   He states he is doing well. Still has back problems and leg cramps that limits his activity. Has gained about 10 lbs. No chest pain, dyspnea, palpitations, edema. BP has been doing well.  Current Outpatient Medications on File Prior to Visit  Medication Sig Dispense Refill  . ACCU-CHEK AVIVA PLUS test strip     . ACCU-CHEK SOFTCLIX LANCETS lancets     . aspirin 81 MG tablet Take 81 mg by mouth daily.      Marland Kitchen atorvastatin (LIPITOR) 40 MG tablet Take 40 mg by mouth daily.    . cyclobenzaprine (FLEXERIL) 10 MG tablet     . finasteride (PROSCAR) 5 MG tablet Take 5 mg by mouth daily.      Marland Kitchen gemfibrozil (LOPID) 600 MG tablet Take 600 mg by mouth 2 (two) times daily.      Marland Kitchen HYDROcodone-acetaminophen (VICODIN) 5-500 MG per tablet Take 1 tablet by mouth as needed.      Marland Kitchen losartan-hydrochlorothiazide (HYZAAR) 100-12.5 MG tablet Take 1 tablet by mouth daily.    . meloxicam (MOBIC) 15 MG tablet Take 15 mg by mouth daily.      . metFORMIN (GLUCOPHAGE) 500 MG tablet Take 1,000 mg by mouth daily with supper.    . pantoprazole (PROTONIX) 40 MG tablet Take 1 tablet by mouth Daily.    . polyethylene glycol powder (GLYCOLAX/MIRALAX) powder Take 1 Container by mouth as needed.    . Tamsulosin HCl (FLOMAX) 0.4 MG CAPS Take by mouth daily.      Marland Kitchen zolpidem (AMBIEN) 10 MG tablet as directed.     No current facility-administered medications on file prior to visit.     Allergies  Allergen Reactions  . Lexiscan [Regadenoson] Itching and Rash    Patient developed mosquite  bite type rash on chest, back and leg post lexiscan.  . Other Itching and Rash    LEXICAM    Past Medical History:  Diagnosis Date  . Aortic stenosis    Mild  . Cancer [facility])    Bladder  . Diabetes mellitus   . GERD (gastroesophageal reflux disease)   . Hypercholesterolemia   . Hypertension   . Melanoma ([place])   . Osteoarthritis     Past Surgical History:  Procedure Laterality Date  . BLADDER SURGERY    . carpal tunnel surgery    . MELANOMA EXCISION    . TOTAL KNEE ARTHROPLASTY     left  . WRIST SURGERY     Right wrist    Social History   Tobacco Use  Smoking Status Former Smoker  . Last attempt to quit: 07/02/1980  . Years since quitting: 37.3  Smokeless Tobacco Never Used    Social History   Substance and Sexual Activity  Alcohol Use Yes    Family History  Problem Relation Age of Onset  . Hypertension Mother     Review of Systems: As noted in HPI.  All other systems were reviewed and are negative.  Physical Exam: BP (!) 154/70  Pulse 65   Ht 6\' 1"  (1.854 m)   Wt 225 lb 9.6 oz (102.3 kg)   BMI 29.76 kg/m  GENERAL:  Well appearing WM in NAD HEENT:  PERRL, EOMI, sclera are clear. Oropharynx is clear. NECK:  No jugular venous distention, carotid upstroke brisk and symmetric, no bruits, no thyromegaly or adenopathy LUNGS:  Clear to auscultation bilaterally CHEST:  Unremarkable HEART:  RRR,  PMI not displaced or sustained,S1 and S2 within normal limits, no S3, no S4: no clicks, no rubs, 1/6 SEM RUSB ABD:  Soft, nontender. BS +, no masses or bruits. No hepatomegaly, no splenomegaly EXT:  2 + pulses throughout, no edema, no cyanosis no clubbing SKIN:  Warm and dry.  No rashes NEURO:  Alert and oriented x 3. Cranial nerves II through XII intact. PSYCH:  Cognitively intact     LABORATORY DATA:  Lab Results  Component Value Date   HGB 16.3 11/01/2010   GLUCOSE 142 (H) 10/30/2010   NA 137 10/30/2010   K 4.8 10/30/2010   CL 105 10/30/2010    CREATININE 0.89 10/30/2010   BUN 14 10/30/2010   CO2 24 10/30/2010   Labs dated 08/22/16: sodium 131. Other chemistries normal. In August 2017. A1c 6%. LDL 74. LFTs normal. Dated 09/04/17: A1c 6.4%. Sodium 133, glucose 140. Other chemistries normal. Feb 20, 2017: cholesterol 131, triglycerides 198, HDL 28, LDL 69.  Ecg today shows NSR with normal Ecg. I have personally reviewed and interpreted this study.   Echo: 07/04/15: Study Conclusions  - Left ventricle: The cavity size was normal. Wall thickness was   increased in a pattern of mild LVH. Systolic function was normal.   The estimated ejection fraction was in the range of 55% to 60%.   Wall motion was normal; there were no regional wall motion   abnormalities. Doppler parameters are consistent with abnormal   left ventricular relaxation (grade 1 diastolic dysfunction). - Aortic valve: There was very mild stenosis.  Assessment / Plan: 1.  Mild aortic stenosis. Very mild by last Echo. Repeat 5 years. No change in exam.   3. Hypertension, generally well controlled. Monitor on current meds.   4. Hyperlipidemia, on a combination of simvastatin and gemfibrozil. Excellent LDL control. Work on weight loss for elevated triglycerides.    5. Diabetes mellitus type 2. On oral therapy with improvement.  6. Hyponatremia. HCTZ reduced. If low sodium level worsens will need to stop HCTZ.  Continue risk factor modification. Follow up in one year.

## 2017-11-05 ENCOUNTER — Encounter: Payer: Self-pay | Admitting: Cardiology

## 2017-11-05 ENCOUNTER — Ambulatory Visit: Payer: Medicare Other | Admitting: Cardiology

## 2017-11-05 VITALS — BP 154/70 | HR 65 | Ht 73.0 in | Wt 225.6 lb

## 2017-11-05 DIAGNOSIS — I350 Nonrheumatic aortic (valve) stenosis: Secondary | ICD-10-CM

## 2017-11-05 DIAGNOSIS — E7800 Pure hypercholesterolemia, unspecified: Secondary | ICD-10-CM

## 2017-11-05 DIAGNOSIS — I10 Essential (primary) hypertension: Secondary | ICD-10-CM | POA: Diagnosis not present

## 2017-11-05 NOTE — Patient Instructions (Signed)
Continue your current therapy  I will see you in one year   

## 2018-06-04 ENCOUNTER — Other Ambulatory Visit: Payer: Self-pay | Admitting: Neurosurgery

## 2018-06-04 DIAGNOSIS — M5416 Radiculopathy, lumbar region: Secondary | ICD-10-CM

## 2018-06-17 ENCOUNTER — Ambulatory Visit
Admission: RE | Admit: 2018-06-17 | Discharge: 2018-06-17 | Disposition: A | Discharge status: Home / Self Care | Payer: Medicare Other | Source: Ambulatory Visit | Attending: Neurosurgery | Admitting: Neurosurgery

## 2018-06-17 DIAGNOSIS — M5416 Radiculopathy, lumbar region: Secondary | ICD-10-CM

## 2018-06-17 MED ORDER — DIAZEPAM 5 MG PO TABS
5.0000 mg | ORAL_TABLET | Freq: Once | ORAL | Status: AC
  Administered 2018-06-17: 5 mg via ORAL

## 2018-06-17 MED ORDER — IOPAMIDOL (ISOVUE-M 200) INJECTION 41%
20.0000 mL | Freq: Once | INTRAMUSCULAR | Status: AC
  Administered 2018-06-17: 20 mL via INTRATHECAL

## 2018-06-17 NOTE — Discharge Instructions (Signed)

## 2018-09-03 ENCOUNTER — Other Ambulatory Visit: Payer: Self-pay | Admitting: Neurosurgery

## 2018-10-20 NOTE — Pre-Procedure Instructions (Signed)
Steve Hunter  10/20/2018      [facility] [place], [place] - [address] [place] [address] Phone: [phone] Fax: [phone]    Your procedure is scheduled on Wed., Jan. 8, 2020 from 9:00AM-2:10PM  Report to [facility] Admitting Entrance "A" at 7:00AM  Call this number if you have problems the morning of surgery:  [phone]   Remember:  Do not eat or drink after midnight on Jan. 7th    Take these medicines the morning of surgery with A SIP OF WATER: Finasteride (PROSCAR), Pantoprazole (PROTONIX),Tamsulosin HCl (FLOMAX), and  Gemfibrozil (LOPID)  If needed: HYDROcodone-acetaminophen (VICODIN)  Follow your surgeon's instructions on when to stop Aspirin.  If no instructions were given by your surgeon then you will need to call the office to get those instructions.    7 days before surgery (10/22/18), stop taking all Other Aspirin Products, Vitamins, Fish oils, and Herbal medications. Also stop all NSAIDS i.e. Advil, Ibuprofen, Motrin, Aleve, Anaprox, Naproxen, BC, Goody Powders, and all Supplements.  Including: Meloxicam (MOBIC)   WHAT DO I DO ABOUT MY DIABETES MEDICATION?  Marland Kitchen Do not take MetFORMIN (GLUCOPHAGE) the morning of surgery.   How to Manage Your Diabetes Before and After Surgery  Why is it important to control my blood sugar before and after surgery? . Improving blood sugar levels before and after surgery helps healing and can limit problems. . A way of improving blood sugar control is eating a healthy diet by: o  Eating less sugar and carbohydrates o  Increasing activity/exercise o  Talking with your doctor about reaching your blood sugar goals . High blood sugars (greater than 180 mg/dL) can raise your risk of infections and slow your recovery, so you will need to focus on controlling your diabetes during the weeks before surgery. . Make sure that the doctor  who takes care of your diabetes knows about your planned surgery including the date and location.  How do I manage my blood sugar before surgery? . Check your blood sugar at least 4 times a day, starting 2 days before surgery, to make sure that the level is not too high or low. o Check your blood sugar the morning of your surgery when you wake up and every 2 hours until you get to the Short Stay unit. . If your blood sugar is less than 70 mg/dL, you will need to treat for low blood sugar: o Do not take insulin. o Treat a low blood sugar (less than 70 mg/dL) with  cup of clear juice (cranberry or apple), 4 glucose tablets, OR glucose gel. Recheck blood sugar in 15 minutes after treatment (to make sure it is greater than 70 mg/dL). If your blood sugar is not greater than 70 mg/dL on recheck, call [phone] o  for further instructions. . If your CBG is greater than 220 mg/dL,inform the staff at Short Stay upon arrival.  . If you are admitted to the hospital after surgery: o Your blood sugar will be checked by the staff and you will probably be given insulin after surgery (instead of oral diabetes medicines) to make sure you have good blood sugar levels. o The goal for blood sugar control after surgery is 80-180 mg/dL.  Reviewed and Endorsed by [facility] Patient Education Committee, August 2015    Do not wear jewelry.  Do not wear lotions,  powders, colognes, or deodorant.  Do not shave 48 hours prior to surgery.  Men may shave face.  Do not bring valuables to the hospital.  [facility] is not responsible for any belongings or valuables.  Contacts, dentures or bridgework may not be worn into surgery.  Leave your suitcase in the car.  After surgery it may be brought to your room.  For patients admitted to the hospital, discharge time will be determined by your treatment team.  Patients discharged the day of surgery will not be allowed to drive home.   Special instructions:  [facility]- Preparing For Surgery  Before surgery, you can play an important role. Because skin is not sterile, your skin needs to be as free of germs as possible. You can reduce the number of germs on your skin by washing with CHG (chlorahexidine gluconate) Soap before surgery.  CHG is an antiseptic cleaner which kills germs and bonds with the skin to continue killing germs even after washing.    Oral Hygiene is also important to reduce your risk of infection.  Remember - BRUSH YOUR TEETH THE MORNING OF SURGERY WITH YOUR REGULAR TOOTHPASTE  Please do not use if you have an allergy to CHG or antibacterial soaps. If your skin becomes reddened/irritated stop using the CHG.  Do not shave (including legs and underarms) for at least 48 hours prior to first CHG shower. It is OK to shave your face.  Please follow these instructions carefully.   1. Shower the NIGHT BEFORE SURGERY and the MORNING OF SURGERY with CHG.   2. If you chose to wash your hair, wash your hair first as usual with your normal shampoo.  3. After you shampoo, rinse your hair and body thoroughly to remove the shampoo.  4. Use CHG as you would any other liquid soap. You can apply CHG directly to the skin and wash gently with a scrungie or a clean washcloth.   5. Apply the CHG Soap to your body ONLY FROM THE NECK DOWN.  Do not use on open wounds or open sores. Avoid contact with your eyes, ears, mouth and genitals (private parts). Wash Face and genitals (private parts)  with your normal soap.  6. Wash thoroughly, paying special attention to the area where your surgery will be performed.  7. Thoroughly rinse your body with warm water from the neck down.  8. DO NOT shower/wash with your normal soap after using and rinsing off the CHG Soap.  9. Pat yourself dry with a CLEAN TOWEL.  10. Wear CLEAN PAJAMAS to bed the night before surgery, wear comfortable clothes the morning of surgery  11. Place CLEAN SHEETS on your bed the night of  your first shower and DO NOT SLEEP WITH PETS.  Day of Surgery:  Do not apply any deodorants/lotions.  Please wear clean clothes to the hospital/surgery center.   Remember to brush your teeth WITH YOUR REGULAR TOOTHPASTE.  Please read over the following fact sheets that you were given. Pain Booklet, Coughing and Deep Breathing, MRSA Information and Surgical Site Infection Prevention

## 2018-10-21 ENCOUNTER — Encounter (HOSPITAL_COMMUNITY)
Admission: RE | Admit: 2018-10-21 | Discharge: 2018-10-21 | Disposition: A | Discharge status: Home / Self Care | Payer: Medicare Other | Source: Ambulatory Visit | Attending: Neurosurgery | Admitting: Neurosurgery

## 2018-10-21 ENCOUNTER — Other Ambulatory Visit: Payer: Self-pay

## 2018-10-21 ENCOUNTER — Encounter (HOSPITAL_COMMUNITY): Payer: Self-pay

## 2018-10-21 DIAGNOSIS — Z79899 Other long term (current) drug therapy: Secondary | ICD-10-CM | POA: Insufficient documentation

## 2018-10-21 DIAGNOSIS — Z7982 Long term (current) use of aspirin: Secondary | ICD-10-CM | POA: Insufficient documentation

## 2018-10-21 DIAGNOSIS — Z01818 Encounter for other preprocedural examination: Secondary | ICD-10-CM | POA: Diagnosis not present

## 2018-10-21 DIAGNOSIS — Z7984 Long term (current) use of oral hypoglycemic drugs: Secondary | ICD-10-CM | POA: Insufficient documentation

## 2018-10-21 DIAGNOSIS — M960 Pseudarthrosis after fusion or arthrodesis: Secondary | ICD-10-CM | POA: Insufficient documentation

## 2018-10-21 DIAGNOSIS — Y838 Other surgical procedures as the cause of abnormal reaction of the patient, or of later complication, without mention of misadventure at the time of the procedure: Secondary | ICD-10-CM | POA: Insufficient documentation

## 2018-10-21 HISTORY — DX: Sleep apnea, unspecified: G47.30

## 2018-10-21 LAB — CBC
HCT: 42.4 % (ref 39.0–52.0)
Hemoglobin: 14.2 g/dL (ref 13.0–17.0)
MCH: 29.3 pg (ref 26.0–34.0)
MCHC: 33.5 g/dL (ref 30.0–36.0)
MCV: 87.4 fL (ref 80.0–100.0)
Platelets: 350 10*3/uL (ref 150–400)
RBC: 4.85 MIL/uL (ref 4.22–5.81)
RDW: 12.7 % (ref 11.5–15.5)
WBC: 7.6 10*3/uL (ref 4.0–10.5)
nRBC: 0 % (ref 0.0–0.2)

## 2018-10-21 LAB — TYPE AND SCREEN
ABO/RH(D): A POS
Antibody Screen: NEGATIVE

## 2018-10-21 LAB — BASIC METABOLIC PANEL
ANION GAP: 9 (ref 5–15)
BUN: 12 mg/dL (ref 8–23)
CO2: 27 mmol/L (ref 22–32)
Calcium: 9.8 mg/dL (ref 8.9–10.3)
Chloride: 96 mmol/L — ABNORMAL LOW (ref 98–111)
Creatinine, Ser: 1.14 mg/dL (ref 0.61–1.24)
Glucose, Bld: 106 mg/dL — ABNORMAL HIGH (ref 70–99)
Potassium: 4.3 mmol/L (ref 3.5–5.1)
Sodium: 132 mmol/L — ABNORMAL LOW (ref 135–145)

## 2018-10-21 LAB — ABO/RH: ABO/RH(D): A POS

## 2018-10-21 LAB — GLUCOSE, CAPILLARY: Glucose-Capillary: 110 mg/dL — ABNORMAL HIGH (ref 70–99)

## 2018-10-21 LAB — SURGICAL PCR SCREEN
MRSA, PCR: NEGATIVE
Staphylococcus aureus: NEGATIVE

## 2018-10-21 NOTE — Progress Notes (Signed)
PCP - Dr. Erin Hearing  Cardiologist - Dr. Peter Martinique  Chest x-ray - Denies  EKG - 11/05/17 (E)  Stress Test - 05/2012 (E)  ECHO - 07/04/15 (E)  Cardiac Cath - Denies  AICD- na PM- na LOOP- na  Sleep Study - Yes- Positive CPAP - None  LABS- 10/21/18: CBC, BMP, T/S, PCR  ASA- LD- 12/30  HA1C- 09/03/18: 6.9 (CE) Fasting Blood Sugar - 105-135, today 110 Checks Blood Sugar __1___ times a day  Anesthesia- No  Pt denies having chest pain, sob, or fever at this time. All instructions explained to the pt, with a verbal understanding of the material. Pt agrees to go over the instructions while at home for a better understanding. The opportunity to ask questions was provided.

## 2018-11-03 ENCOUNTER — Ambulatory Visit: Payer: Medicare Other | Admitting: Cardiology

## 2018-11-12 ENCOUNTER — Encounter (HOSPITAL_COMMUNITY): Admission: RE | Payer: Self-pay | Source: Home / Self Care

## 2018-11-12 ENCOUNTER — Inpatient Hospital Stay (HOSPITAL_COMMUNITY): Admission: RE | Admit: 2018-11-12 | Payer: Medicare Other | Source: Home / Self Care | Admitting: Neurosurgery

## 2018-11-12 SURGERY — POSTERIOR LUMBAR FUSION 2 WITH HARDWARE REMOVAL
Anesthesia: General

## 2018-12-04 ENCOUNTER — Encounter: Payer: Self-pay | Admitting: Cardiology

## 2018-12-18 NOTE — Progress Notes (Signed)
[name] Date of Birth: 11/19/1945   History of Present Illness: Steve Hunter is seen today for follow up. He has a history of very mild aortic stenosis. His last echocardiogram was in September 2016. He has a  LexiScan Myoview study in 2013 which was normal. He did have an allergic reaction following this study with a diffuse rash, presumably from the [place]. He has a history of HTN, DM, and HL. In June 2017 he had L4-S1 fusion. Had repeat back surgery in January without difficulty.   He states he is doing well. His back still limits his activity. No chest pain, dyspnea or palpitations. Rare funny feeling in his chest but can't really describe.  Current Outpatient Medications on File Prior to Visit  Medication Sig Dispense Refill  . ACCU-CHEK AVIVA PLUS test strip     . ACCU-CHEK SOFTCLIX LANCETS lancets     . aspirin 81 MG tablet Take 81 mg by mouth daily.      Marland Kitchen atorvastatin (LIPITOR) 40 MG tablet Take 40 mg by mouth daily.    . diazepam (VALIUM) 5 MG tablet TK 1 T PO QID PRF MSP    . finasteride (PROSCAR) 5 MG tablet Take 5 mg by mouth daily.      . Flaxseed, Linseed, (FLAXSEED OIL) 1000 MG CAPS Take 1 capsule by mouth daily.    Marland Kitchen gemfibrozil (LOPID) 600 MG tablet Take 600 mg by mouth 2 (two) times daily.      . hydrochlorothiazide (MICROZIDE) 12.5 MG capsule Take 12.5 mg by mouth daily.    Marland Kitchen HYDROcodone-acetaminophen (VICODIN) 5-500 MG per tablet Take 1 tablet by mouth daily as needed for pain.     Marland Kitchen losartan (COZAAR) 100 MG tablet Take 100 mg by mouth daily.    . meloxicam (MOBIC) 15 MG tablet Take 15 mg by mouth daily.      . metFORMIN (GLUCOPHAGE) 500 MG tablet Take 500-1,000 mg by mouth See admin instructions. 500 mg in the morning, 1000 mg at supper time    . pantoprazole (PROTONIX) 40 MG tablet Take 1 tablet by mouth Daily.    Vladimir Faster Glycol-Propyl Glycol (SYSTANE ULTRA) 0.4-0.3 % SOLN Place 1 drop into both eyes 3 (three) times daily as needed (dry eyes).    .  polyethylene glycol powder (GLYCOLAX/MIRALAX) powder Take 17 g by mouth 2 (two) times daily as needed for moderate constipation.     . Tamsulosin HCl (FLOMAX) 0.4 MG CAPS Take 0.4 mg by mouth daily.     Marland Kitchen HYDROmorphone (DILAUDID) 2 MG tablet TK 1 T PO Q 4 H PRF PAIN     No current facility-administered medications on file prior to visit.     Allergies  Allergen Reactions  . Lexiscan [Regadenoson] Itching and Rash    Patient developed mosquite bite type rash on chest, back and leg post lexiscan.    Past Medical History:  Diagnosis Date  . Aortic stenosis    Mild  . 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 Diabetes mellitus    Type II  . GERD (gastroesophageal reflux disease)   . Hypercholesterolemia   . Hypertension   . Melanoma ([place])   . Osteoarthritis   . Sleep apnea    no cpap    Past Surgical History:  Procedure Laterality Date  . BLADDER SURGERY    . carpal tunnel surgery    . MELANOMA EXCISION    . TONSILLECTOMY    . TOTAL KNEE ARTHROPLASTY  left  . WRIST SURGERY     Right wrist    Social History   Tobacco Use  Smoking Status Former Smoker  . Last attempt to quit: 07/02/1980  . Years since quitting: 38.4  Smokeless Tobacco Never Used    Social History   Substance and Sexual Activity  Alcohol Use Yes    Family History  Problem Relation Age of Onset  . Hypertension Mother     Review of Systems: As noted in HPI.  All other systems were reviewed and are negative.  Physical Exam: BP 136/68   Pulse 67   Ht 6\' 2"  (1.88 m)   Wt 222 lb (100.7 kg)   BMI 28.50 kg/m  GENERAL:  Well appearing WM in NAD HEENT:  PERRL, EOMI, sclera are clear. Oropharynx is clear. NECK:  No jugular venous distention, carotid upstroke brisk and symmetric, no bruits, no thyromegaly or adenopathy LUNGS:  Clear to auscultation bilaterally CHEST:  Unremarkable HEART:  RRR,  PMI not displaced or sustained,S1 and S2 within normal limits, no S3, no S4: no clicks, no rubs, soft systolic  murmur RUSB. ABD:  Soft, nontender. BS +, no masses or bruits. No hepatomegaly, no splenomegaly EXT:  2 + pulses throughout, no edema, no cyanosis no clubbing SKIN:  Warm and dry.  No rashes NEURO:  Alert and oriented x 3. Cranial nerves II through XII intact. PSYCH:  Cognitively intact      LABORATORY DATA:  Lab Results  Component Value Date   WBC 7.6 10/21/2018   HGB 14.2 10/21/2018   HCT 42.4 10/21/2018   PLT 350 10/21/2018   GLUCOSE 106 (H) 10/21/2018   NA 132 (L) 10/21/2018   K 4.3 10/21/2018   CL 96 (L) 10/21/2018   CREATININE 1.14 10/21/2018   BUN 12 10/21/2018   CO2 27 10/21/2018   Labs dated 08/22/16: sodium 131. Other chemistries normal. In August 2017. A1c 6%. LDL 74. LFTs normal. Dated 09/04/17: A1c 6.4%. Sodium 133, glucose 140. Other chemistries normal. Feb 20, 2017: cholesterol 131, triglycerides 198, HDL 28, LDL 69. Dated 09/03/18: A1c 6.9%. cholesterol 130, triglycerides 186, HDL 28, LDL 85. Sodium 133. Otherwise chemistries and CBC normal.   Ecg today shows NSR with normal Ecg. I have personally reviewed and interpreted this study.   Echo: 07/04/15: Study Conclusions  - Left ventricle: The cavity size was normal. Wall thickness was   increased in a pattern of mild LVH. Systolic function was normal.   The estimated ejection fraction was in the range of 55% to 60%.   Wall motion was normal; there were no regional wall motion   abnormalities. Doppler parameters are consistent with abnormal   left ventricular relaxation (grade 1 diastolic dysfunction). - Aortic valve: There was very mild stenosis.  Assessment / Plan: 1.  Mild aortic stenosis. Very mild by last Echo. Repeat 5 years. No change in exam.   3. Hypertension,  well controlled. Continue current meds.   4. Hyperlipidemia, on a combination of simvastatin and gemfibrozil.  LDL OK for primary prevention   5. Diabetes mellitus type 2. On oral therapy with improvement.  Continue risk factor  modification. Follow up in one year.

## 2018-12-22 ENCOUNTER — Encounter: Payer: Self-pay | Admitting: Cardiology

## 2018-12-22 ENCOUNTER — Ambulatory Visit: Payer: Medicare Other | Admitting: Cardiology

## 2018-12-22 VITALS — BP 136/68 | HR 67 | Ht 74.0 in | Wt 222.0 lb

## 2018-12-22 DIAGNOSIS — I10 Essential (primary) hypertension: Secondary | ICD-10-CM

## 2018-12-22 DIAGNOSIS — I350 Nonrheumatic aortic (valve) stenosis: Secondary | ICD-10-CM

## 2018-12-22 DIAGNOSIS — E7800 Pure hypercholesterolemia, unspecified: Secondary | ICD-10-CM | POA: Diagnosis not present

## 2019-07-15 ENCOUNTER — Other Ambulatory Visit: Payer: Self-pay | Admitting: Neurosurgery

## 2019-07-15 DIAGNOSIS — S32009K Unspecified fracture of unspecified lumbar vertebra, subsequent encounter for fracture with nonunion: Secondary | ICD-10-CM

## 2019-07-22 ENCOUNTER — Ambulatory Visit
Admission: RE | Admit: 2019-07-22 | Discharge: 2019-07-22 | Disposition: A | Discharge status: Home / Self Care | Payer: Medicare Other | Source: Ambulatory Visit | Attending: Neurosurgery | Admitting: Neurosurgery

## 2019-07-22 ENCOUNTER — Other Ambulatory Visit: Payer: Self-pay

## 2019-07-22 DIAGNOSIS — S32009K Unspecified fracture of unspecified lumbar vertebra, subsequent encounter for fracture with nonunion: Secondary | ICD-10-CM

## 2019-12-17 NOTE — Progress Notes (Signed)
Steve Hunter Current Date of Birth: Jul 21, 1946   History of Present Illness: Steve Hunter is seen today for follow up. He has a history of very mild aortic stenosis. His last echocardiogram was in September 2016. He has a  LexiScan Myoview study in 2013 which was normal. He did have an allergic reaction following this study with a diffuse rash, presumably from the [place]. He has a history of HTN, DM, and HL. In June 2017 he had L4-S1 fusion. Had repeat back surgery in January 2020 without difficulty.   On follow up He states he is doing well. His back still limits his activity. No chest pain, dyspnea or palpitations. No dizziness or edema.   Current Outpatient Medications on File Prior to Visit  Medication Sig Dispense Refill  . Accu-Chek Softclix Lancets lancets USE AS DIRECTED 2-3 TIMES DAILY    . aspirin 81 MG tablet Take 81 mg by mouth daily.      Marland Kitchen atorvastatin (LIPITOR) 40 MG tablet TAKE 1 TABLET BY MOUTH EVERY DAY FOR CHOLESTEROL CONTROL    . finasteride (PROSCAR) 5 MG tablet TAKE 1 TABLET(5 MG) BY MOUTH DAILY    . Flaxseed, Linseed, (FLAXSEED OIL) 1000 MG CAPS Take 1 capsule by mouth daily.    Marland Kitchen gemfibrozil (LOPID) 600 MG tablet TAKE 1 TABLET BY MOUTH TWICE DAILY TO CONTROL LIPIDS    . glucose blood (ACCU-CHEK AVIVA PLUS) test strip USE AS DIRECTED 2-3 TIMES DAILY    . hydrochlorothiazide (HYDRODIURIL) 12.5 MG tablet Take 12.5 mg by mouth daily.    Marland Kitchen losartan (COZAAR) 100 MG tablet TAKE 1 TABLET(100 MG) BY MOUTH DAILY    . meloxicam (MOBIC) 15 MG tablet Take one tablet daily with a meal for arthritis pain    . metFORMIN (GLUCOPHAGE-XR) 500 MG 24 hr tablet TAKE 1 TABLET BY MOUTH EVERY MORNING THEN TAKE 2 TABLETS BY MOUTH EVERY NIGHT    . pantoprazole (PROTONIX) 40 MG tablet TAKE 1 TABLET BY MOUTH EVERY DAY FOR HEARTBURN    . Polyethyl Glycol-Propyl Glycol (SYSTANE ULTRA) 0.4-0.3 % SOLN Place 1 drop into both eyes 3 (three) times daily as needed (dry eyes).    . polyethylene glycol powder  (GLYCOLAX/MIRALAX) powder Take 17 g by mouth 2 (two) times daily as needed for moderate constipation.     . tamsulosin (FLOMAX) 0.4 MG CAPS capsule TAKE 1 CAPSULE(0.4 MG) BY MOUTH 30 MINUTES BEFORE DINNER     No current facility-administered medications on file prior to visit.    Allergies  Allergen Reactions  . Lexiscan [Regadenoson] Itching and Rash    Patient developed mosquite bite type rash on chest, back and leg post lexiscan.    Past Medical History:  Diagnosis Date  . Aortic stenosis    Mild  . 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 Diabetes mellitus    Type II  . GERD (gastroesophageal reflux disease)   . Hypercholesterolemia   . Hypertension   . Melanoma ([place])   . Osteoarthritis   . Sleep apnea    no cpap    Past Surgical History:  Procedure Laterality Date  . BLADDER SURGERY    . carpal tunnel surgery    . MELANOMA EXCISION    . TONSILLECTOMY    . TOTAL KNEE ARTHROPLASTY     left  . WRIST SURGERY     Right wrist    Social History   Tobacco Use  Smoking Status Former Smoker  . Quit date: 07/02/1980  .  Years since quitting: 39.4  Smokeless Tobacco Never Used    Social History   Substance and Sexual Activity  Alcohol Use Yes    Family History  Problem Relation Age of Onset  . Hypertension Mother     Review of Systems: As noted in HPI.  All other systems were reviewed and are negative.  Physical Exam: BP 136/72   Pulse 68   Temp 97.9 F (36.6 C)   Ht 6\' 2"  (1.88 m)   Wt 215 lb (97.5 kg)   SpO2 95%   BMI 27.60 kg/m  GENERAL:  Well appearing WM in NAD HEENT:  PERRL, EOMI, sclera are clear. Oropharynx is clear. NECK:  No jugular venous distention, carotid upstroke brisk and symmetric, no bruits, no thyromegaly or adenopathy LUNGS:  Clear to auscultation bilaterally CHEST:  Unremarkable HEART:  RRR,  PMI not displaced or sustained,S1 and S2 within normal limits, no S3, no S4: no clicks, no rubs, soft [id number] systolic murmur RUSB. ABD:  Soft,  nontender. BS +, no masses or bruits. No hepatomegaly, no splenomegaly EXT:  2 + pulses throughout, no edema, no cyanosis no clubbing SKIN:  Warm and dry.  No rashes NEURO:  Alert and oriented x 3. Cranial nerves II through XII intact. PSYCH:  Cognitively intact    LABORATORY DATA:  Lab Results  Component Value Date   WBC 7.6 10/21/2018   HGB 14.2 10/21/2018   HCT 42.4 10/21/2018   PLT 350 10/21/2018   GLUCOSE 106 (H) 10/21/2018   NA 132 (L) 10/21/2018   K 4.3 10/21/2018   CL 96 (L) 10/21/2018   CREATININE 1.14 10/21/2018   BUN 12 10/21/2018   CO2 27 10/21/2018   Labs dated 08/22/16: sodium 131. Other chemistries normal. In August 2017. A1c 6%. LDL 74. LFTs normal. Dated 09/04/17: A1c 6.4%. Sodium 133, glucose 140. Other chemistries normal. Feb 20, 2017: cholesterol 131, triglycerides 198, HDL 28, LDL 69. Dated 09/03/18: A1c 6.9%. cholesterol 130, triglycerides 186, HDL 28, LDL 85. Sodium 133. Otherwise chemistries and CBC normal.  Dated 10/07/19: cholesterol 133, triglycerides 159, HDL 30, LDL 86. Sodium 132, glucose 144. Otherwise CMET normal. CBC normal. A1c 6.5%  Ecg today shows NSR with normal Ecg. Rate 68. I have personally reviewed and interpreted this study.   Echo: 07/04/15: Study Conclusions  - Left ventricle: The cavity size was normal. Wall thickness was   increased in a pattern of mild LVH. Systolic function was normal.   The estimated ejection fraction was in the range of 55% to 60%.   Wall motion was normal; there were no regional wall motion   abnormalities. Doppler parameters are consistent with abnormal   left ventricular relaxation (grade 1 diastolic dysfunction). - Aortic valve: There was very mild stenosis.  Assessment / Plan: 1.  Mild aortic stenosis. Very mild by last Echo.  No change in exam. Asymptomatic.   3. Hypertension,  well controlled. Continue current meds.   4. Hyperlipidemia, on a combination of simvastatin and gemfibrozil.  LDL OK for  primary prevention   5. Diabetes mellitus type 2. On oral therapy. Per primary care.  Continue risk factor modification. Follow up in one year.

## 2019-12-22 ENCOUNTER — Other Ambulatory Visit: Payer: Self-pay

## 2019-12-22 ENCOUNTER — Encounter: Payer: Self-pay | Admitting: Cardiology

## 2019-12-22 ENCOUNTER — Ambulatory Visit (INDEPENDENT_AMBULATORY_CARE_PROVIDER_SITE_OTHER): Payer: Medicare Other | Admitting: Cardiology

## 2019-12-22 VITALS — BP 136/72 | HR 68 | Temp 97.9°F | Ht 74.0 in | Wt 215.0 lb

## 2019-12-22 DIAGNOSIS — I10 Essential (primary) hypertension: Secondary | ICD-10-CM | POA: Diagnosis not present

## 2019-12-22 DIAGNOSIS — E7800 Pure hypercholesterolemia, unspecified: Secondary | ICD-10-CM

## 2019-12-22 DIAGNOSIS — I350 Nonrheumatic aortic (valve) stenosis: Secondary | ICD-10-CM

## 2020-02-09 IMAGING — CT CT L SPINE W/O CM
1 of 6 series · 6 of 14 positions shown, 8 images · non-contrast
Comparison: 06/17/2018

CLINICAL DATA: Low back pain for 1 month, left leg pain.

EXAM:
CT LUMBAR SPINE WITHOUT CONTRAST
TECHNIQUE: Multidetector CT imaging of the lumbar spine was performed without
intravenous contrast administration. Multiplanar CT image
reconstructions were also generated.

[Series 3: l spine soft · axial · 0.42mm/px · z∈[-322,-127]mm · 6 of 92 slices shown, 8 images]
[im 14/92  soft-tissue]
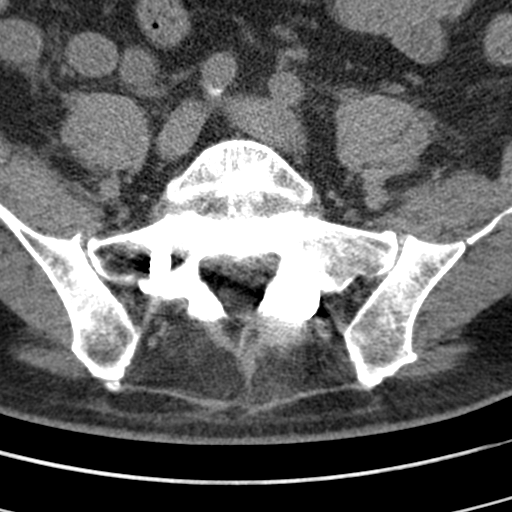
[im 14/92  bone]
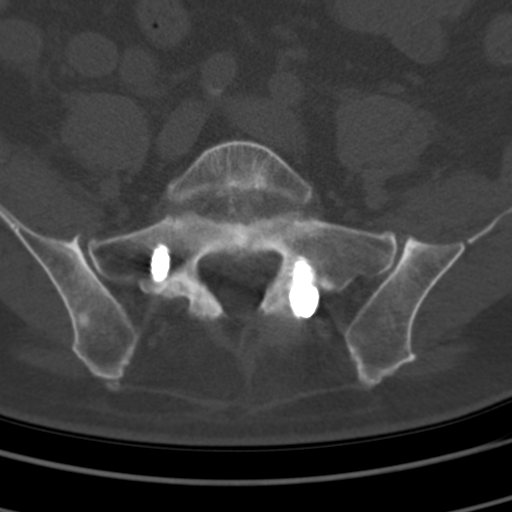
[im 27/92  bone]
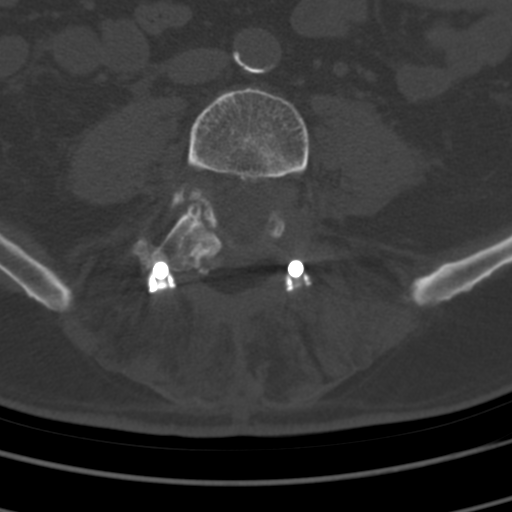
[im 40/92  bone]
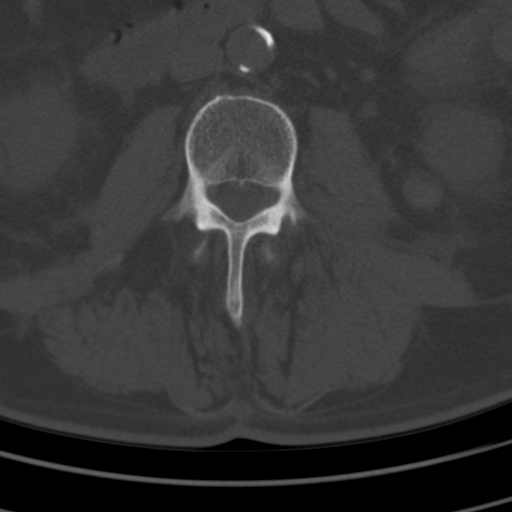
[im 53/92  bone]
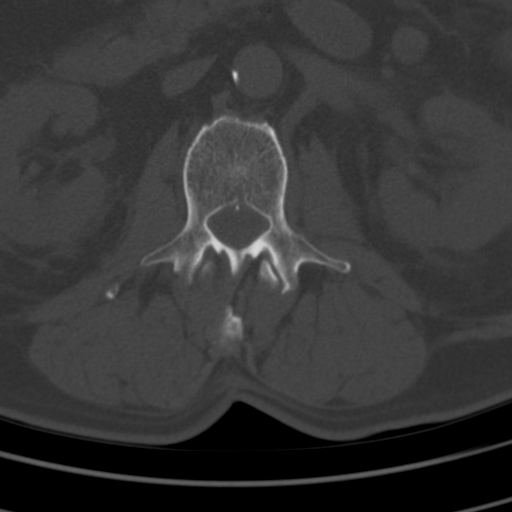
[im 66/92  soft-tissue]
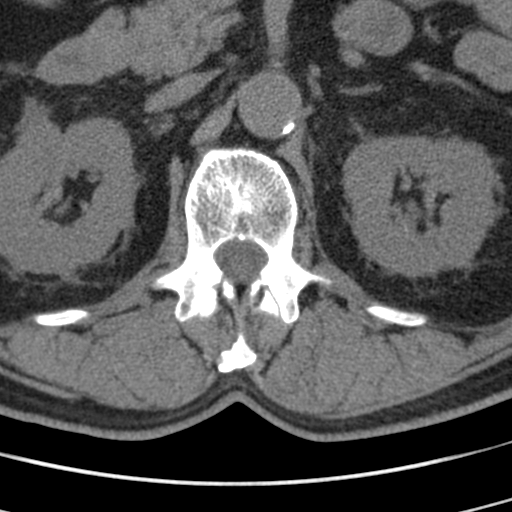
[im 66/92  bone]
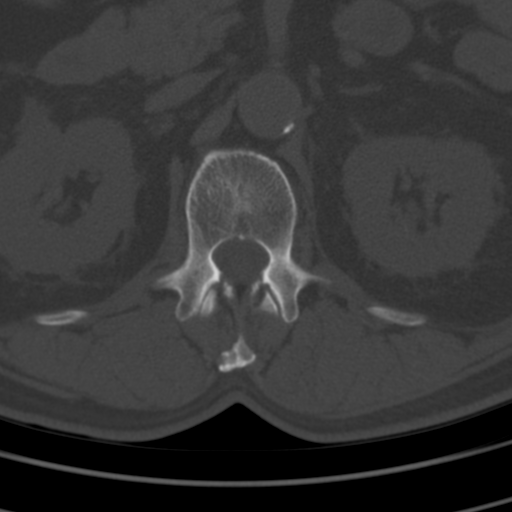
[im 79/92  bone]
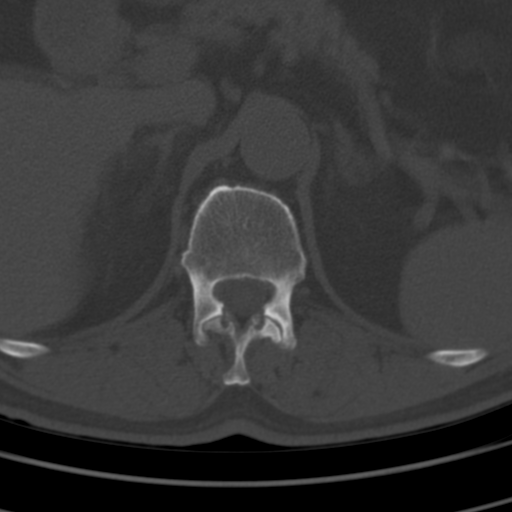

[6 of 14 positions shown; findings below may reference images not displayed]

FINDINGS: Segmentation: The lowest lumbar type non-rib-bearing vertebra is
labeled as L5.

Alignment: Stable 10 mm anterolisthesis at L4-5. Stable 3 mm
degenerative retrolisthesis at L2-3.

Vertebrae: Posterior decompression at L4-L5-S1. I do not see
definite fusion at the right facet joint at L4-5 and there is been
left facetectomy at this level. Posterolateral rod and pedicle screw
fixation at L4-L5-S1 noted with lucency around the L4 screw threads
compatible with loosening or infection. The left L5 screw has been
replaced and is well positioned.

Solid interbody fusion at L5-S1.

No acute fracture is identified. There are old fractures of the
right L1, L2, L3, and L4 transverse processes.

Paraspinal and other soft tissues: Aortoiliac atherosclerotic
vascular disease. Hyperdense exophytic lesion of the left kidney
lower pole measuring 1.8 by 1.4 cm, image 50/3. This formerly
measured 1.5 by 1.5 cm.

Mild perirenal stranding bilaterally, tracking along the
retroperitoneum into the pelvis.

Disc levels: T12-L1: Unremarkable.

L1-2: Unremarkable.

L2-3: Moderate central narrowing of the thecal sac and likely mild
bilateral subarticular lateral recess stenosis due to disc bulge.
This is mildly worsened compared to prior.

L3-4: Mild bilateral foraminal stenosis and suspected moderate
central narrowing of the thecal sac similar to prior due to disc
bulge, intervertebral spurring, and facet arthropathy.

L4-5: Mild bilateral foraminal stenosis due to subluxation, similar
to prior.

L5-S1: No impingement identified.  Fused level.
IMPRESSION: 1. Pseudarthrosis at L4-5 with lucency around the L4 screw threads
compatible with loosening, and 10 mm anterolisthesis at L4-5. Left
facetectomy at this level, and no definite facet fusion on the
right.
2. There is likely bridging bone graft at the L5-S1 level.
3. Spondylosis and degenerative disc disease contribute to moderate
impingement at L2-3 and mild impingement at L3-4 and L4-5. The
impingement at L2-3 is mildly worsened from prior.
4. Stable complex exophytic lesion from the left kidney lower pole,
probably a complex cyst although enhancement characteristics are not
assessed today.
5. Old fractures the right L1, L2, L3, and L4 transverse processes.
6.  Aortic Atherosclerosis (BC4J6-ON1.1).
7. Stable chronic retroperitoneal and perirenal stranding.

## 2020-12-26 NOTE — Progress Notes (Signed)
Steve Hunter: 05/12/1946   History of Present Illness: Steve Hunter is seen today for follow up. He has a history of very mild aortic stenosis. His last echocardiogram was in September 2016. He has a  LexiScan Myoview study in 2013 which was normal. He did have an allergic reaction following this study with a diffuse rash, presumably from the [place]. He has a history of HTN, DM, and HL. In June 2017 he had L4-S1 fusion. Had repeat back surgery in January 2020 without difficulty.   On follow up He states he is doing well. His back still limits his activity. No chest pain, dyspnea or palpitations. No dizziness or edema. He is concerned about his weight and sugar- trying to do better with his diet. He denies any chest pain or dyspnea.    Current Outpatient Medications on File Prior to Visit  Medication Sig Dispense Refill  . Accu-Chek Softclix Lancets lancets USE AS DIRECTED 2-3 TIMES DAILY    . aspirin 81 MG tablet Take 81 mg by mouth daily.    Marland Kitchen atorvastatin (LIPITOR) 40 MG tablet TAKE 1 TABLET BY MOUTH EVERY DAY FOR CHOLESTEROL CONTROL    . finasteride (PROSCAR) 5 MG tablet TAKE 1 TABLET(5 MG) BY MOUTH DAILY    . Flaxseed, Linseed, (FLAXSEED OIL) 1000 MG CAPS Take 1 capsule by mouth daily.    Marland Kitchen gemfibrozil (LOPID) 600 MG tablet Take 600 mg by mouth daily.    Marland Kitchen glucose blood (ACCU-CHEK AVIVA PLUS) test strip USE AS DIRECTED 2-3 TIMES DAILY    . hydrochlorothiazide (HYDRODIURIL) 12.5 MG tablet Take 12.5 mg by mouth daily.    Marland Kitchen losartan (COZAAR) 100 MG tablet TAKE 1 TABLET(100 MG) BY MOUTH DAILY    . meloxicam (MOBIC) 15 MG tablet Take one tablet daily with a meal for arthritis pain    . metFORMIN (GLUCOPHAGE-XR) 500 MG 24 hr tablet TAKE 1 TABLET BY MOUTH EVERY MORNING THEN TAKE 2 TABLETS BY MOUTH EVERY NIGHT    . pantoprazole (PROTONIX) 40 MG tablet TAKE 1 TABLET BY MOUTH EVERY DAY FOR HEARTBURN    . Polyethyl Glycol-Propyl Glycol 0.4-0.3 % SOLN Place 1 drop into both eyes 3  (three) times daily as needed (dry eyes).    . polyethylene glycol powder (GLYCOLAX/MIRALAX) powder Take 17 g by mouth 2 (two) times daily as needed for moderate constipation.     . tamsulosin (FLOMAX) 0.4 MG CAPS capsule TAKE 1 CAPSULE(0.4 MG) BY MOUTH 30 MINUTES BEFORE DINNER     No current facility-administered medications on file prior to visit.    Allergies  Allergen Reactions  . Lexiscan [Regadenoson] Itching and Rash    Patient developed mosquite bite type rash on chest, back and leg post lexiscan.    Past Medical History:  Diagnosis Date  . Aortic stenosis    Mild  . 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 Diabetes mellitus    Type II  . GERD (gastroesophageal reflux disease)   . Hypercholesterolemia   . Hypertension   . Melanoma ([place])   . Osteoarthritis   . Sleep apnea    no cpap    Past Surgical History:  Procedure Laterality Date  . BLADDER SURGERY    . carpal tunnel surgery    . MELANOMA EXCISION    . TONSILLECTOMY    . TOTAL KNEE ARTHROPLASTY     left  . WRIST SURGERY     Right wrist    Social History  Tobacco Use  Smoking Status Former Smoker  . Quit date: 07/02/1980  . Years since quitting: 40.5  Smokeless Tobacco Never Used    Social History   Substance and Sexual Activity  Alcohol Use Yes    Family History  Problem Relation Age of Onset  . Hypertension Mother     Review of Systems: As noted in HPI.  All other systems were reviewed and are negative.  Physical Exam: BP 140/80   Pulse 73   Ht 6\' 2"  (1.88 m)   Wt 222 lb (100.7 kg)   SpO2 97%   BMI 28.50 kg/m  GENERAL:  Well appearing WM in NAD HEENT:  PERRL, EOMI, sclera are clear. Oropharynx is clear. NECK:  No jugular venous distention, carotid upstroke brisk and symmetric, no bruits, no thyromegaly or adenopathy LUNGS:  Clear to auscultation bilaterally CHEST:  Unremarkable HEART:  RRR,  PMI not displaced or sustained,S1 and S2 within normal limits, no S3, no S4: no clicks, no rubs,  soft 9-6/2 systolic murmur RUSB. ABD:  Soft, nontender. BS +, no masses or bruits. No hepatomegaly, no splenomegaly EXT:  2 + pulses throughout, no edema, no cyanosis no clubbing SKIN:  Warm and dry.  No rashes NEURO:  Alert and oriented x 3. Cranial nerves II through XII intact. PSYCH:  Cognitively intact    LABORATORY DATA:  Lab Results  Component Value Date   WBC 7.6 10/21/2018   HGB 14.2 10/21/2018   HCT 42.4 10/21/2018   PLT 350 10/21/2018   GLUCOSE 106 (H) 10/21/2018   NA 132 (L) 10/21/2018   K 4.3 10/21/2018   CL 96 (L) 10/21/2018   CREATININE 1.14 10/21/2018   BUN 12 10/21/2018   CO2 27 10/21/2018   Labs dated 08/22/16: sodium 131. Other chemistries normal. In August 2017. A1c 6%. LDL 74. LFTs normal. Dated 09/04/17: A1c 6.4%. Sodium 133, glucose 140. Other chemistries normal. Feb 20, 2017: cholesterol 131, triglycerides 198, HDL 28, LDL 69. Dated 09/03/18: A1c 6.9%. cholesterol 130, triglycerides 186, HDL 28, LDL 85. Sodium 133. Otherwise chemistries and CBC normal.  Dated 10/07/19: cholesterol 133, triglycerides 159, HDL 30, LDL 86. Sodium 132, glucose 144. Otherwise CMET normal. CBC normal. A1c 6.5% Dated 10/12/20: Hgb 13.7. cholesterol 146, triglycerides 247, HDL 31. LDL 89. A1c 6.9%.   Ecg today shows NSR with normal Ecg. Rate 73. LAD.  I have personally reviewed and interpreted this study.   Echo: 07/04/15: Study Conclusions  - Left ventricle: The cavity size was normal. Wall thickness was   increased in a pattern of mild LVH. Systolic function was normal.   The estimated ejection fraction was in the range of 55% to 60%.   Wall motion was normal; there were no regional wall motion   abnormalities. Doppler parameters are consistent with abnormal   left ventricular relaxation (grade 1 diastolic dysfunction). - Aortic valve: There was very mild stenosis.  Assessment / Plan: 1.  Mild aortic stenosis. Very mild by last Echo.  No change in exam. Asymptomatic.    3. Hypertension, borderline.  Continue current meds. Monitor BP at home. Low sodium  4. Hyperlipidemia, on a combination of simvastatin and gemfibrozil.  He has been taking gemfibrozil BID - I told him he should only take once a day. LDL OK for primary prevention   5. Diabetes mellitus type 2. On oral therapy. Per primary care.  Continue risk factor modification. Follow up in one year.

## 2020-12-30 ENCOUNTER — Ambulatory Visit (INDEPENDENT_AMBULATORY_CARE_PROVIDER_SITE_OTHER): Payer: Medicare Other | Admitting: Cardiology

## 2020-12-30 ENCOUNTER — Other Ambulatory Visit: Payer: Self-pay

## 2020-12-30 ENCOUNTER — Encounter: Payer: Self-pay | Admitting: Cardiology

## 2020-12-30 VITALS — BP 140/80 | HR 73 | Ht 74.0 in | Wt 222.0 lb

## 2020-12-30 DIAGNOSIS — I10 Essential (primary) hypertension: Secondary | ICD-10-CM | POA: Diagnosis not present

## 2020-12-30 DIAGNOSIS — I350 Nonrheumatic aortic (valve) stenosis: Secondary | ICD-10-CM

## 2020-12-30 DIAGNOSIS — E7800 Pure hypercholesterolemia, unspecified: Secondary | ICD-10-CM

## 2022-01-04 NOTE — Progress Notes (Signed)
? ?Steve Hunter ?Date of Birth: 02/03/1946 ? ? ?History of Present Illness: ?Steve Hunter is seen today for follow up. He has a history of very mild aortic stenosis. His last echocardiogram was in September 2016. He has a  LexiScan Myoview study in 2013 which was normal. He did have an allergic reaction following this study with a diffuse rash, presumably from the [place]. He has a history of HTN, DM, and HL. In June 2017 he had L4-S1 fusion. Had repeat back surgery in January 2020 without difficulty.  ? ?On follow up he states he is doing well. His back still limits his activity. No chest pain, dyspnea or palpitations. No dizziness or edema. He reports last A1c 6.9%.  ? ?Current Outpatient Medications on File Prior to Visit  ?Medication Sig Dispense Refill  ? Accu-Chek Softclix Lancets lancets USE AS DIRECTED 2-3 TIMES DAILY    ? aspirin 81 MG tablet Take 81 mg by mouth daily.    ? atorvastatin (LIPITOR) 40 MG tablet TAKE 1 TABLET BY MOUTH EVERY DAY FOR CHOLESTEROL CONTROL    ? finasteride (PROSCAR) 5 MG tablet TAKE 1 TABLET(5 MG) BY MOUTH DAILY    ? Flaxseed, Linseed, (FLAXSEED OIL) 1000 MG CAPS Take 1 capsule by mouth daily.    ? gemfibrozil (LOPID) 600 MG tablet Take 600 mg by mouth daily.    ? glucose blood (ACCU-CHEK AVIVA PLUS) test strip USE AS DIRECTED 2-3 TIMES DAILY    ? hydrochlorothiazide (HYDRODIURIL) 12.5 MG tablet Take 12.5 mg by mouth daily.    ? losartan (COZAAR) 100 MG tablet TAKE 1 TABLET(100 MG) BY MOUTH DAILY    ? meloxicam (MOBIC) 15 MG tablet Take one tablet daily with a meal for arthritis pain    ? metFORMIN (GLUCOPHAGE-XR) 500 MG 24 hr tablet Take 1,000 mg by mouth in the morning and at bedtime.    ? pantoprazole (PROTONIX) 40 MG tablet TAKE 1 TABLET BY MOUTH EVERY DAY FOR HEARTBURN    ? Polyethyl Glycol-Propyl Glycol 0.4-0.3 % SOLN Place 1 drop into both eyes 3 (three) times daily as needed (dry eyes).    ? tamsulosin (FLOMAX) 0.4 MG CAPS capsule TAKE 1 CAPSULE(0.4 MG) BY MOUTH 30  MINUTES BEFORE DINNER    ? ?No current facility-administered medications on file prior to visit.  ? ? ?Allergies  ?Allergen Reactions  ? Lexiscan [Regadenoson] Itching and Rash  ?  Patient developed mosquite bite type rash on chest, back and leg post lexiscan.  ? ? ?Past Medical History:  ?Diagnosis Date  ? Aortic stenosis   ? Mild  ? Cancer [facility])   ? Bladder  ? Diabetes mellitus   ? Type II  ? GERD (gastroesophageal reflux disease)   ? Hypercholesterolemia   ? Hypertension   ? Melanoma ([place])   ? Osteoarthritis   ? Sleep apnea   ? no cpap  ? ? ?Past Surgical History:  ?Procedure Laterality Date  ? BLADDER SURGERY    ? carpal tunnel surgery    ? MELANOMA EXCISION    ? TONSILLECTOMY    ? TOTAL KNEE ARTHROPLASTY    ? left  ? WRIST SURGERY    ? Right wrist  ? ? ?Social History  ? ?Tobacco Use  ?Smoking Status Former  ? Types: Cigarettes  ? Quit date: 07/02/1980  ? Years since quitting: 41.5  ?Smokeless Tobacco Never  ? ? ?Social History  ? ?Substance and Sexual Activity  ?Alcohol Use Yes  ? ? ?Family History  ?  Problem Relation Age of Onset  ? Hypertension Mother   ? ? ?Review of Systems: ?As noted in HPI.  All other systems were reviewed and are negative. ? ?Physical Exam: ?BP 128/60 (BP Location: Left Arm, Patient Position: Sitting, Cuff Size: Normal)   Pulse 70   Ht '6\' 2"'$  (1.88 m)   Wt 213 lb (96.6 kg)   BMI 27.35 kg/m?  ?GENERAL:  Well appearing WM in NAD ?HEENT:  PERRL, EOMI, sclera are clear. Oropharynx is clear. ?NECK:  No jugular venous distention, carotid upstroke brisk and symmetric, no bruits, no thyromegaly or adenopathy ?LUNGS:  Clear to auscultation bilaterally ?CHEST:  Unremarkable ?HEART:  RRR,  PMI not displaced or sustained,S1 and S2 within normal limits, no S3, no S4: no clicks, no rubs, soft 1/6 systolic murmur RUSB. ?ABD:  Soft, nontender. BS +, no masses or bruits. No hepatomegaly, no splenomegaly ?EXT:  2 + pulses throughout, no edema, no cyanosis no clubbing ?SKIN:  Warm and dry.  No  rashes ?NEURO:  Alert and oriented x 3. Cranial nerves II through XII intact. ?PSYCH:  Cognitively intact ? ? ? ?LABORATORY DATA: ? ?Lab Results  ?Component Value Date  ? WBC 7.6 10/21/2018  ? HGB 14.2 10/21/2018  ? HCT 42.4 10/21/2018  ? PLT 350 10/21/2018  ? GLUCOSE 106 (H) 10/21/2018  ? NA 132 (L) 10/21/2018  ? K 4.3 10/21/2018  ? CL 96 (L) 10/21/2018  ? CREATININE 1.14 10/21/2018  ? BUN 12 10/21/2018  ? CO2 27 10/21/2018  ? ?Labs dated 08/22/16: sodium 131. Other chemistries normal. ?In August 2017. A1c 6%. LDL 74. LFTs normal. ?Dated 09/04/17: A1c 6.4%. Sodium 133, glucose 140. Other chemistries normal. ?Feb 20, 2017: cholesterol 131, triglycerides 198, HDL 28, LDL 69. ?Dated 09/03/18: A1c 6.9%. cholesterol 130, triglycerides 186, HDL 28, LDL 85. Sodium 133. Otherwise chemistries and CBC normal.  ?Dated 10/07/19: cholesterol 133, triglycerides 159, HDL 30, LDL 86. Sodium 132, glucose 144. Otherwise CMET normal. CBC normal. A1c 6.5% ?Dated 10/12/20: Hgb 13.7. cholesterol 146, triglycerides 247, HDL 31. LDL 89. A1c 6.9%.  ?Dated 10/17/21: cholesterol 129, triglycerides 132, HDL 31, LDL 83. Sodium 131, glucose 135. Otherwise CMET and CBC normal. ? ?Ecg today shows NSR with LAD. Rate 70. LAD.  I have personally reviewed and interpreted this study. ? ? ?Echo: 07/04/15: Study Conclusions ?  ?- Left ventricle: The cavity size was normal. Wall thickness was ?  increased in a pattern of mild LVH. Systolic function was normal. ?  The estimated ejection fraction was in the range of 55% to 60%. ?  Wall motion was normal; there were no regional wall motion ?  abnormalities. Doppler parameters are consistent with abnormal ?  left ventricular relaxation (grade 1 diastolic dysfunction). ?- Aortic valve: There was very mild stenosis. ? ?Assessment / Plan: ?1.  Mild aortic stenosis. Very mild by last Echo.  No change in exam. Asymptomatic.  ? ?3. Hypertension, under control.  Continue current meds.  Low sodium ? ?4. Hyperlipidemia,  on a combination of simvastatin and gemfibrozil. satisfactory ? ?5. Diabetes mellitus type 2. On oral therapy. Per primary care. ? ?Continue risk factor modification. Follow up in one year. ?

## 2022-01-09 ENCOUNTER — Other Ambulatory Visit: Payer: Self-pay

## 2022-01-09 ENCOUNTER — Encounter: Payer: Self-pay | Admitting: Cardiology

## 2022-01-09 ENCOUNTER — Ambulatory Visit (INDEPENDENT_AMBULATORY_CARE_PROVIDER_SITE_OTHER): Payer: Medicare Other | Admitting: Cardiology

## 2022-01-09 VITALS — BP 128/60 | HR 70 | Ht 74.0 in | Wt 213.0 lb

## 2022-01-09 DIAGNOSIS — I10 Essential (primary) hypertension: Secondary | ICD-10-CM

## 2022-01-09 DIAGNOSIS — E7800 Pure hypercholesterolemia, unspecified: Secondary | ICD-10-CM | POA: Diagnosis not present

## 2022-01-09 DIAGNOSIS — I350 Nonrheumatic aortic (valve) stenosis: Secondary | ICD-10-CM

## 2022-01-09 NOTE — Addendum Note (Signed)
Addended by: Kathyrn Lass on: 01/09/2022 03:25 PM ? ? Modules accepted: Orders ? ?

## 2022-07-18 HISTORY — PX: CYSTOSCOPY WITH INSERTION OF UROLIFT: SHX6678

## 2022-11-02 ENCOUNTER — Other Ambulatory Visit: Payer: Self-pay

## 2022-11-02 ENCOUNTER — Emergency Department (HOSPITAL_BASED_OUTPATIENT_CLINIC_OR_DEPARTMENT_OTHER)
Admission: EM | Admit: 2022-11-02 | Discharge: 2022-11-02 | Disposition: A | Discharge status: Home / Self Care | Payer: Medicare Other | Attending: Emergency Medicine | Admitting: Emergency Medicine

## 2022-11-02 DIAGNOSIS — E871 Hypo-osmolality and hyponatremia: Secondary | ICD-10-CM

## 2022-11-02 DIAGNOSIS — M5430 Sciatica, unspecified side: Secondary | ICD-10-CM | POA: Diagnosis not present

## 2022-11-02 DIAGNOSIS — Z7984 Long term (current) use of oral hypoglycemic drugs: Secondary | ICD-10-CM | POA: Insufficient documentation

## 2022-11-02 DIAGNOSIS — Z79899 Other long term (current) drug therapy: Secondary | ICD-10-CM | POA: Diagnosis not present

## 2022-11-02 DIAGNOSIS — N390 Urinary tract infection, site not specified: Secondary | ICD-10-CM | POA: Insufficient documentation

## 2022-11-02 DIAGNOSIS — R531 Weakness: Secondary | ICD-10-CM | POA: Diagnosis not present

## 2022-11-02 DIAGNOSIS — R209 Unspecified disturbances of skin sensation: Secondary | ICD-10-CM | POA: Insufficient documentation

## 2022-11-02 DIAGNOSIS — R7989 Other specified abnormal findings of blood chemistry: Secondary | ICD-10-CM | POA: Insufficient documentation

## 2022-11-02 DIAGNOSIS — Z7982 Long term (current) use of aspirin: Secondary | ICD-10-CM | POA: Diagnosis not present

## 2022-11-02 DIAGNOSIS — R339 Retention of urine, unspecified: Secondary | ICD-10-CM | POA: Diagnosis not present

## 2022-11-02 LAB — BASIC METABOLIC PANEL
Anion gap: 9 (ref 5–15)
BUN: 31 mg/dL — ABNORMAL HIGH (ref 8–23)
CO2: 26 mmol/L (ref 22–32)
Calcium: 9.3 mg/dL (ref 8.9–10.3)
Chloride: 92 mmol/L — ABNORMAL LOW (ref 98–111)
Creatinine, Ser: 1.92 mg/dL — ABNORMAL HIGH (ref 0.61–1.24)
GFR, Estimated: 35 mL/min — ABNORMAL LOW (ref >60)
Glucose, Bld: 130 mg/dL — ABNORMAL HIGH (ref 70–99)
Potassium: 4.2 mmol/L (ref 3.5–5.1)
Sodium: 127 mmol/L — ABNORMAL LOW (ref 135–145)

## 2022-11-02 LAB — CBC
HCT: 31.2 % — ABNORMAL LOW (ref 39.0–52.0)
Hemoglobin: 11 g/dL — ABNORMAL LOW (ref 13.0–17.0)
MCH: 30.3 pg (ref 26.0–34.0)
MCHC: 35.3 g/dL (ref 30.0–36.0)
MCV: 86 fL (ref 80.0–100.0)
Platelets: 381 10*3/uL (ref 150–400)
RBC: 3.63 MIL/uL — ABNORMAL LOW (ref 4.22–5.81)
RDW: 12.7 % (ref 11.5–15.5)
WBC: 8.9 10*3/uL (ref 4.0–10.5)
nRBC: 0 % (ref 0.0–0.2)

## 2022-11-02 LAB — URINALYSIS, ROUTINE W REFLEX MICROSCOPIC
Bilirubin Urine: NEGATIVE
Glucose, UA: NEGATIVE mg/dL
Ketones, ur: NEGATIVE mg/dL
Nitrite: POSITIVE — AB
Protein, ur: 30 mg/dL — AB
Specific Gravity, Urine: 1.012 (ref 1.005–1.030)
WBC, UA: >50 WBC/hpf — ABNORMAL HIGH (ref 0–5)
pH: 6 (ref 5.0–8.0)

## 2022-11-02 LAB — MAGNESIUM: Magnesium: 1.8 mg/dL (ref 1.7–2.4)

## 2022-11-02 MED ORDER — HYDROMORPHONE HCL 1 MG/ML IJ SOLN
1.0000 mg | Freq: Once | INTRAMUSCULAR | Status: AC
  Administered 2022-11-02: 1 mg via INTRAVENOUS
  Filled 2022-11-02: qty 1

## 2022-11-02 MED ORDER — CEPHALEXIN 500 MG PO CAPS: 500.0000 mg | ORAL_CAPSULE | Freq: Three times a day (TID) | ORAL | 0 refills | Status: AC

## 2022-11-02 MED ORDER — SODIUM CHLORIDE 0.9 % IV SOLN
1.0000 g | Freq: Once | INTRAVENOUS | Status: AC
  Administered 2022-11-02: 1 g via INTRAVENOUS
  Filled 2022-11-02: qty 10

## 2022-11-02 MED ORDER — SODIUM CHLORIDE 0.9 % IV BOLUS
1000.0000 mL | Freq: Once | INTRAVENOUS | Status: AC
  Administered 2022-11-02: 1000 mL via INTRAVENOUS

## 2022-11-02 MED ORDER — SENNOSIDES-DOCUSATE SODIUM 8.6-50 MG PO TABS: 1.0000 | ORAL_TABLET | Freq: Every day | ORAL | 0 refills | Status: AC

## 2022-11-02 MED ORDER — OXYCODONE HCL 5 MG PO TABS: 5.0000 mg | ORAL_TABLET | Freq: Four times a day (QID) | ORAL | 0 refills | Status: DC | PRN

## 2022-11-02 NOTE — ED Provider Notes (Signed)
[facility] Provider Note   CSN: [id number] Arrival date & time: 11/02/22  1739     History  No chief complaint on file.   Steve Hunter is a 77 y.o. male w/ hx of urinary retention, nocturia, who underwent UroLift 07/18/22 with Dr Violet Baldy at [facility].  Patient presents with worsening frustration that since he had his UroLift he has had difficulty with urination, worsening pain in his bladder, also now developing paresthesias in his legs, weakness, sciatica.  He is here with his wife as well.  They were seen in the office 10 days ago by their urologist, who put the patient on trospium at night, noted Pvr at acceptable levels for him  HPI     Home Medications Prior to Admission medications   Medication Sig Start Date End Date Taking? Authorizing Provider  cephALEXin (KEFLEX) 500 MG capsule Take 1 capsule (500 mg total) by mouth 3 (three) times daily for 9 days. 11/03/22 11/12/22 Yes Yajaira Doffing, Carola Rhine, MD  oxyCODONE (ROXICODONE) 5 MG immediate release tablet Take 1 tablet (5 mg total) by mouth every 6 (six) hours as needed for up to 10 doses for severe pain. 11/02/22  Yes Deja Kaigler, Carola Rhine, MD  senna-docusate (SENOKOT-S) 8.6-50 MG tablet Take 1 tablet by mouth at bedtime for 30 doses. 11/02/22 12/02/22 Yes Kincaid Tiger, Carola Rhine, MD  Accu-Chek Softclix Lancets lancets USE AS DIRECTED 2-3 TIMES DAILY 05/25/19   [provider]  aspirin 81 MG tablet Take 81 mg by mouth daily.    [provider]  atorvastatin (LIPITOR) 40 MG tablet TAKE 1 TABLET BY MOUTH EVERY DAY FOR CHOLESTEROL CONTROL 04/15/19   [provider]  finasteride (PROSCAR) 5 MG tablet TAKE 1 TABLET(5 MG) BY MOUTH DAILY 07/17/19   [provider]  Flaxseed, Linseed, (FLAXSEED OIL) 1000 MG CAPS Take 1 capsule by mouth daily.    [provider]  gemfibrozil (LOPID) 600 MG tablet Take 600 mg by mouth daily. 04/15/19   [provider]  glucose blood (ACCU-CHEK  AVIVA PLUS) test strip USE AS DIRECTED 2-3 TIMES DAILY 05/26/19   [provider]  hydrochlorothiazide (HYDRODIURIL) 12.5 MG tablet Take 12.5 mg by mouth daily. 11/08/19   [provider]  losartan (COZAAR) 100 MG tablet TAKE 1 TABLET(100 MG) BY MOUTH DAILY 04/15/19   [provider]  meloxicam (MOBIC) 15 MG tablet Take one tablet daily with a meal for arthritis pain 04/15/19   [provider]  metFORMIN (GLUCOPHAGE-XR) 500 MG 24 hr tablet Take 1,000 mg by mouth in the morning and at bedtime. 09/30/19   [provider]  pantoprazole (PROTONIX) 40 MG tablet TAKE 1 TABLET BY MOUTH EVERY DAY FOR HEARTBURN 04/15/19   [provider]  Polyethyl Glycol-Propyl Glycol 0.4-0.3 % SOLN Place 1 drop into both eyes 3 (three) times daily as needed (dry eyes).    [provider]  tamsulosin (FLOMAX) 0.4 MG CAPS capsule TAKE 1 CAPSULE(0.4 MG) BY MOUTH 30 MINUTES BEFORE DINNER 08/20/19   [provider]      Allergies    Lexiscan [regadenoson]    Review of Systems   Review of Systems  Physical Exam Updated Vital Signs BP 130/76   Pulse 80   Temp 98.1 F (36.7 C) (Oral)   Resp 18   Ht '6\' 2"'$  (1.88 m)   Wt 83 kg   SpO2 100%   BMI 23.50 kg/m  Physical Exam Constitutional:      General:  He is not in acute distress. HENT:     Head: Normocephalic and atraumatic.  Eyes:     Conjunctiva/sclera: Conjunctivae normal.     Pupils: Pupils are equal, round, and reactive to light.  Cardiovascular:     Rate and Rhythm: Normal rate and regular rhythm.  Pulmonary:     Effort: Pulmonary effort is normal. No respiratory distress.  Abdominal:     General: There is no distension.     Tenderness: There is no abdominal tenderness.  Skin:    General: Skin is warm and dry.  Neurological:     General: No focal deficit present.     Mental Status: He is alert and oriented to person, place, and time. Mental status is at baseline.  Psychiatric:         Mood and Affect: Mood normal.        Behavior: Behavior normal.     ED Results / Procedures / Treatments   Labs (all labs ordered are listed, but only abnormal results are displayed) Labs Reviewed  BASIC METABOLIC PANEL - Abnormal; Notable for the following components:      Result Value   Sodium 127 (*)    Chloride 92 (*)    Glucose, Bld 130 (*)    BUN 31 (*)    Creatinine, Ser 1.92 (*)    GFR, Estimated 35 (*)    All other components within normal limits  CBC - Abnormal; Notable for the following components:   RBC 3.63 (*)    Hemoglobin 11.0 (*)    HCT 31.2 (*)    All other components within normal limits  URINALYSIS, ROUTINE W REFLEX MICROSCOPIC - Abnormal; Notable for the following components:   APPearance CLOUDY (*)    Hgb urine dipstick MODERATE (*)    Protein, ur 30 (*)    Nitrite POSITIVE (*)    Leukocytes,Ua LARGE (*)    WBC, UA >50 (*)    Bacteria, UA FEW (*)    All other components within normal limits  URINE CULTURE  MAGNESIUM    EKG None  Radiology No results found.  Procedures Procedures    Medications Ordered in ED Medications  cefTRIAXone (ROCEPHIN) 1 g in sodium chloride 0.9 % 100 mL IVPB (0 g Intravenous Stopped 11/02/22 2050)  sodium chloride 0.9 % bolus 1,000 mL (0 mLs Intravenous Stopped 11/02/22 2050)  HYDROmorphone (DILAUDID) injection 1 mg (1 mg Intravenous Given 11/02/22 2050)    ED Course/ Medical Decision Making/ A&P Clinical Course as of 11/02/22 2059  Fri Nov 02, 2022  2040 Labs are consistent with a UTI.  There is a mild hyponatremia which may be related to poor oral intake, as well as some minor increase of creatinine, from Cr 1.6 in Sept. this is likely related to poor hydration and patient reports that he will drink more water at home now. [MT]  2040 Pain medicine was given in the ED and a small dose prescribed for home.  He has a follow-up appointment on Monday in 2 days with his urologist.  I would prefer to avoid placing a  catheter now that he has a known urinary infection, and without significant retention, and he is able to void at the bedside. [MT]    Clinical Course User Index [MT] Kynadee Dam, Carola Rhine, MD                             Medical Decision Making Amount  and/or Complexity of Data Reviewed Labs: ordered.  Risk OTC drugs. Prescription drug management.   This patient presents to the ED with concern for urinary hesitation. This involves an extensive number of treatment options, and is a complaint that carries with it a high risk of complications and morbidity.  The differential diagnosis includes UTI vs bladder spasm vs other  Co-morbidities that complicate the patient evaluation: recent surgery of prostate at risk of infection  Additional history obtained from patient's wife  External records from outside source obtained and reviewed including outpatient urology note from 10/23/22  I ordered and personally interpreted labs.  The pertinent results include:  UA with +leuks, nitrites; BMP with Na 127, Cr 1.92, BUN 31.  WBC wnl.  Urine cx sent  I ordered medication including rocephin, IV fluids,  I have reviewed the patients home medicines and have made adjustments as needed  Test Considered: no indication for CT imaging of the abdomen at this time.  After the interventions noted above, I reevaluated the patient and found that they have: improved   Dispostion:  After consideration of the diagnostic results and the patients response to treatment, I feel that the patent would benefit from outpatient urology f/u.         Final Clinical Impression(s) / ED Diagnoses Final diagnoses:  Urinary retention  Urinary tract infection without hematuria, site unspecified  Hyponatremia  Elevated serum creatinine    Rx / DC Orders ED Discharge Orders          Ordered    cephALEXin (KEFLEX) 500 MG capsule  3 times daily        11/02/22 2040    oxyCODONE (ROXICODONE) 5 MG immediate release  tablet  Every 6 hours PRN        11/02/22 2040    senna-docusate (SENOKOT-S) 8.6-50 MG tablet  Daily at bedtime        11/02/22 2040              Wyvonnia Dusky, MD 11/02/22 2059

## 2022-11-02 NOTE — Discharge Instructions (Addendum)
Please follow-up with your urologist on Monday as scheduled.  You were treated with antibiotics today in the ER for urinary tract infection.  You will need to be on 9 more days of antibiotics which were sent to your pharmacy.  Please drink plenty of regular water at home.  Your blood test did show some signs of dehydration with your kidneys, which may be due to poor fluid intake.

## 2022-11-02 NOTE — ED Triage Notes (Signed)
Pt had a urological surgery in September and doesn't feel like he is able to empty his bladder now. Pt with multiple complaints and annoyed that he has to explain. Pt states that his legs hurt and he isn't able to sleep due to being up and down all night. Pt complains of weakness and has had multiple falls due to this.

## 2022-11-04 LAB — URINE CULTURE: Culture: >=100000 — AB

## 2022-11-05 ENCOUNTER — Telehealth (HOSPITAL_BASED_OUTPATIENT_CLINIC_OR_DEPARTMENT_OTHER): Payer: Self-pay | Admitting: *Deleted

## 2022-11-05 NOTE — Telephone Encounter (Signed)
Post ED Visit - Positive Culture Follow-up: Successful Patient Follow-Up  Culture assessed and recommendations reviewed by:  '[]'$  Elenor Quinones, Pharm.D. '[]'$  Heide Guile, Pharm.D., BCPS AQ-ID '[]'$  Parks Neptune, Pharm.D., BCPS '[]'$  Alycia Rossetti, Pharm.D., BCPS '[]'$  [place], [place].D., BCPS, AAHIVP '[]'$  Legrand Como, Pharm.D., BCPS, AAHIVP '[]'$  Salome Arnt, PharmD, BCPS '[]'$  Johnnette Gourd, PharmD, BCPS '[]'$  Hughes Better, PharmD, BCPS '[x]'$  Erskine Speed, PharmD  Positive urine culture  '[]'$  Patient discharged without antimicrobial prescription and treatment is now indicated '[x]'$  Organism is resistant to prescribed ED discharge antimicrobial '[]'$  Patient with positive blood cultures  Changes discussed with ED provider: Domenic Moras, PA-C New antibiotic prescription Ciprofloxacin '500mg'$  PO BID x 5 days Called to [place] [place], [place] patient, date 11/05/22, time 0919   Rosie Fate 11/05/2022, 9:18 AM

## 2022-11-05 NOTE — Progress Notes (Signed)
ED Antimicrobial Stewardship Positive Culture Follow Up   [facility] is an 77 y.o. male who presented to [facility] on 11/02/2022 with a chief complaint of No chief complaint on file.   Recent Results (from the past 720 hour(s))  Urine Culture     Status: Abnormal   Collection Time: 11/02/22  7:29 PM   Specimen: Urine, Clean Catch  Result Value Ref Range Status   Specimen Description   Final    URINE, CLEAN CATCH Performed at [facility], [address] [place], [place] [address]    Special Requests   Final    NONE Performed at [facility], [address] [place], [place] [address]    Culture (A)  Final    >=100,000 COLONIES/mL PSEUDOMONAS AERUGINOSA Two isolates with different morphologies were identified as the same organism.The most resistant organism was reported. Performed at [facility], [place] [address] [place], [place] [address]    Report Status 11/04/2022 FINAL  Final   Organism ID, Bacteria PSEUDOMONAS AERUGINOSA (A)  Final      Susceptibility   Pseudomonas aeruginosa - MIC*    CEFTAZIDIME 4 SENSITIVE Sensitive     CIPROFLOXACIN 0.5 SENSITIVE Sensitive     GENTAMICIN <=1 SENSITIVE Sensitive     IMIPENEM 2 SENSITIVE Sensitive     PIP/TAZO 8 SENSITIVE Sensitive     CEFEPIME 2 SENSITIVE Sensitive     * >=100,000 COLONIES/mL PSEUDOMONAS AERUGINOSA    '[x]'$  Treated with cephalexin, organism resistant to prescribed antimicrobial. Stop cephalexin and start antibiotic described below.  New antibiotic prescription: Ciprofloxacin '500mg'$  PO BID x 5 days  Treating as complicated UTI given history of UroLift. CrCl 37 mL/min warranting dose reduction.  ED Provider: Domenic Moras, PA-C  Merrilee Jansky, PharmD 11/05/2022, 8:12 AM Clinical Pharmacist Monday - Friday phone -  [phone] Saturday - Sunday phone - [phone]

## 2022-11-30 ENCOUNTER — Encounter: Payer: Self-pay | Admitting: Urology

## 2023-01-01 ENCOUNTER — Other Ambulatory Visit: Payer: Self-pay | Admitting: Orthopedic Surgery

## 2023-01-01 DIAGNOSIS — M5451 Vertebrogenic low back pain: Secondary | ICD-10-CM

## 2023-01-04 ENCOUNTER — Encounter: Payer: Self-pay | Admitting: Urology

## 2023-01-04 NOTE — Progress Notes (Unsigned)
Steve Hunter Date of Birth: January 25, 1946   History of Present Illness: Steve Hunter is seen today for pre op evaluation. He has a history of very mild aortic stenosis. His last echocardiogram was in September 2016. He has a  LexiScan Myoview study in 2013 which was normal. He did have an allergic reaction following this study with a diffuse rash, presumably from the [place]. He has a history of HTN, DM, and HL. In June 2017 he had L4-S1 fusion.   More recently he has worsening back pain with neuropathy. Seeing Dr Steve Hunter. 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 Has indwelling foley. Now seeing Dr Steve Hunter.  He denies any chest pain, dyspnea, edema, or palpitations. No dizziness.   [facility] Outpatient Medications on File Prior to Visit  Medication Sig Dispense Refill   Accu-Chek Softclix Lancets lancets USE AS DIRECTED 2-3 TIMES DAILY     aspirin 81 MG tablet Take 81 mg by mouth daily.     atorvastatin (LIPITOR) 40 MG tablet TAKE 1 TABLET BY MOUTH EVERY DAY FOR CHOLESTEROL CONTROL     finasteride (PROSCAR) 5 MG tablet TAKE 1 TABLET(5 MG) BY MOUTH DAILY     glucose blood (ACCU-CHEK AVIVA PLUS) test strip USE AS DIRECTED 2-3 TIMES DAILY     hydrochlorothiazide (HYDRODIURIL) 12.5 MG tablet Take 12.5 mg by mouth daily.     LINZESS 145 MCG CAPS capsule      losartan (COZAAR) 100 MG tablet TAKE 1 TABLET(100 MG) BY MOUTH DAILY     meloxicam (MOBIC) 15 MG tablet Take one tablet daily with a meal for arthritis pain     metFORMIN (GLUCOPHAGE-XR) 500 MG 24 hr tablet Take 1,000 mg by mouth in the morning and at bedtime.     pantoprazole (PROTONIX) 40 MG tablet TAKE 1 TABLET BY MOUTH EVERY DAY FOR HEARTBURN     Polyethyl Glycol-Propyl Glycol 0.4-0.3 % SOLN Place 1 drop into both eyes 3 (three) times daily as needed (dry eyes).     tamsulosin (FLOMAX) 0.4 MG CAPS capsule TAKE 1 CAPSULE(0.4 MG) BY MOUTH 30 MINUTES BEFORE DINNER     No Hunter facility-administered medications on  file prior to visit.    Allergies  Allergen Reactions   Lexiscan [Regadenoson] Itching and Rash    Patient developed mosquite bite type rash on chest, back and leg post lexiscan.    Past Medical History:  Diagnosis Date   Aortic stenosis    Mild   Cancer (HCC)    Bladder   Diabetes mellitus    Type II   GERD (gastroesophageal reflux disease)    Hypercholesterolemia    Hypertension    Melanoma ([place])    Osteoarthritis    Sleep apnea    no cpap    Past Surgical History:  Procedure Laterality Date   BLADDER SURGERY     carpal tunnel surgery     CYSTOSCOPY WITH INSERTION OF UROLIFT  07/18/2022   MELANOMA EXCISION     TONSILLECTOMY     TOTAL KNEE ARTHROPLASTY     left   WRIST SURGERY     Right wrist    Social History   Tobacco Use  Smoking Status Former   Types: Cigarettes   Quit date: 07/02/1980   Years since quitting: 42.5  Smokeless Tobacco Never    Social History   Substance and Sexual Activity  Alcohol Use Yes    Family History  Problem Relation Age of Onset  Hypertension Mother     Review of Systems: As noted in HPI.  All other systems were reviewed and are negative.  Physical Exam: BP 132/60   Pulse 76   Ht 6\' 2"  (1.88 m)   Wt 186 lb 3.2 oz (84.5 kg)   SpO2 96%   BMI 23.91 kg/m  GENERAL:  Well appearing WM in NAD HEENT:  PERRL, EOMI, sclera are clear. Oropharynx is clear. NECK:  No jugular venous distention, carotid upstroke brisk and symmetric, no bruits, no thyromegaly or adenopathy LUNGS:  Clear to auscultation bilaterally CHEST:  Unremarkable HEART:  RRR,  PMI not displaced or sustained,S1 and S2 within normal limits, no S3, no S4: no clicks, no rubs, very soft 1/6 systolic murmur RUSB. ABD:  Soft, nontender. BS +, no masses or bruits. No hepatomegaly, no splenomegaly EXT:  2 + pulses throughout, no edema, no cyanosis no clubbing SKIN:  Warm and dry.  No rashes NEURO:  Alert and oriented x 3. Cranial nerves II through XII  intact. PSYCH:  Cognitively intact    LABORATORY DATA:  Lab Results  Component Value Date   WBC 8.9 11/02/2022   HGB 11.0 (L) 11/02/2022   HCT 31.2 (L) 11/02/2022   PLT 381 11/02/2022   GLUCOSE 130 (H) 11/02/2022   NA 127 (L) 11/02/2022   K 4.2 11/02/2022   CL 92 (L) 11/02/2022   CREATININE 1.92 (H) 11/02/2022   BUN 31 (H) 11/02/2022   CO2 26 11/02/2022   Labs dated 08/22/16: sodium 131. Other chemistries normal. In August 2017. A1c 6%. LDL 74. LFTs normal. Dated 09/04/17: A1c 6.4%. Sodium 133, glucose 140. Other chemistries normal. Feb 20, 2017: cholesterol 131, triglycerides 198, HDL 28, LDL 69. Dated 09/03/18: A1c 6.9%. cholesterol 130, triglycerides 186, HDL 28, LDL 85. Sodium 133. Otherwise chemistries and CBC normal.  Dated 10/07/19: cholesterol 133, triglycerides 159, HDL 30, LDL 86. Sodium 132, glucose 144. Otherwise CMET normal. CBC normal. A1c 6.5% Dated 10/12/20: Hgb 13.7. cholesterol 146, triglycerides 247, HDL 31. LDL 89. A1c 6.9%.  Dated 10/17/21: cholesterol 129, triglycerides 132, HDL 31, LDL 83. Sodium 131, glucose 135. Otherwise CMET and CBC normal. Dated 04/19/22: cholesterol 121, triglycerides 107, HDL 32, LDL 77. Aic 6.9%  Ecg today shows NSR with LAD. Rate 79. LAFB.  I have personally reviewed and interpreted this study.   Echo: 07/04/15: Study Conclusions   - Left ventricle: The cavity size was normal. Wall thickness was   increased in a pattern of mild LVH. Systolic function was normal.   The estimated ejection fraction was in the range of 55% to 60%.   Wall motion was normal; there were no regional wall motion   abnormalities. Doppler parameters are consistent with abnormal   left ventricular relaxation (grade 1 diastolic dysfunction). - Aortic valve: There was very mild stenosis.  Assessment / Plan: 1.  Minimal aortic stenosis. Very mild by last Echo.  No change in exam. Asymptomatic.   3. Hypertension, under control.  Continue Hunter meds.  Low  sodium  4. Hyperlipidemia, satisfactory  5. Diabetes mellitus type 2. On oral therapy. Per primary care.  Continue risk factor modification. Patient is a suitable candidate for back surgery and or Bladder surgery if needed. No additional cardiac testing needed. I think he is low risk.

## 2023-01-07 ENCOUNTER — Encounter: Payer: Self-pay | Admitting: Cardiology

## 2023-01-07 ENCOUNTER — Ambulatory Visit: Payer: PRIVATE HEALTH INSURANCE | Attending: Cardiology | Admitting: Cardiology

## 2023-01-07 VITALS — BP 132/60 | HR 76 | Ht 74.0 in | Wt 186.2 lb

## 2023-01-07 DIAGNOSIS — E7800 Pure hypercholesterolemia, unspecified: Secondary | ICD-10-CM

## 2023-01-07 DIAGNOSIS — I10 Essential (primary) hypertension: Secondary | ICD-10-CM

## 2023-01-07 DIAGNOSIS — I350 Nonrheumatic aortic (valve) stenosis: Secondary | ICD-10-CM

## 2023-01-08 ENCOUNTER — Ambulatory Visit
Admission: RE | Admit: 2023-01-08 | Discharge: 2023-01-08 | Disposition: A | Discharge status: Home / Self Care | Payer: Medicare Other | Source: Ambulatory Visit | Attending: Orthopedic Surgery | Admitting: Orthopedic Surgery

## 2023-01-08 DIAGNOSIS — M5451 Vertebrogenic low back pain: Secondary | ICD-10-CM

## 2023-01-16 ENCOUNTER — Telehealth: Payer: Self-pay | Admitting: Neurology

## 2023-01-16 ENCOUNTER — Encounter: Payer: Self-pay | Admitting: Neurology

## 2023-01-16 ENCOUNTER — Ambulatory Visit: Payer: Medicare Other | Admitting: Neurology

## 2023-01-16 VITALS — BP 125/73 | HR 86 | Ht 72.0 in | Wt 184.6 lb

## 2023-01-16 DIAGNOSIS — R269 Unspecified abnormalities of gait and mobility: Secondary | ICD-10-CM

## 2023-01-16 DIAGNOSIS — G629 Polyneuropathy, unspecified: Secondary | ICD-10-CM

## 2023-01-16 DIAGNOSIS — M542 Cervicalgia: Secondary | ICD-10-CM

## 2023-01-16 NOTE — Telephone Encounter (Signed)
[employer] medicare NPR sent to GI [phone] 

## 2023-01-16 NOTE — Progress Notes (Signed)
[facility]  PATIENT: Steve Hunter DOB: [birth year]  REFERRING DOCTOR OR PCP: Steve Schools MD; Steve Eriksson, MD SOURCE: Patient, note from Dr. Rolena Hunter, imaging reports, MRI images personally reviewed.  _________________________________   HISTORICAL  CHIEF COMPLAINT:  Chief Complaint  Patient presents with   new patinet    Pt in room 10, new patient, walks with cane. Here for balance issues. Pt said last fall was at home, he fell twice. Pt states at home he hits the walls when walking due to balance.     HISTORY OF PRESENT ILLNESS:  I had the pleasure seeing your patient, Steve Hunter, at [facility] for a neurologic consultation regarding his gait issues.  He is a 77 yo man who has had difficulty with gait for the last year.  In early 2023, he began to note weakness in his lower back and leg.  He had trouble raising the leg.  He has had pain since summer 2023.  Pain is in lower back and into the left > right buttock and radiating down both legs.     Pain is minimal or absent while sitting..   Pain increases with standing and increases more if he walks or is on his feet a longer time.    He feels the legs are weaker as he walks a longer distance.    He also feels off balanced with his walking.     He denies numbess or tingling in legs but notes some in his feet while he is rested.       He has a catheter due to prostate issues.  He was having retention for the past year and had a surgical procedure but it had not helped.   He reported no issues with his bladder several years ago.  IMAGING MRI of the lumbar spine 12/11/2022 was personally reviewed.  He has prior L4-S1 PLIF.    At L2-L3, there is minimal retrolisthesis and disc protrusion causing mild to moderate spinal stenosis.  There is right greater than left foraminal narrowing and likely some lateral recess stenosis as well. At L3-L4, there is mild retrolisthesis, facet hypertrophy, endplate  spurring, ligamenta flava hypertrophy combining to cause severe spinal stenosis in both AP and transverse diameters.  There is moderate foraminal narrowing but severe lateral recess stenosis to both sides, right greater than left. At L4-L5, there is previous decompression surgery with interbody arthrodesis and pedicle screws.  There is 7 mm anterolisthesis disc bulging but no spinal stenosis.  There is right greater than left foraminal narrowing that could affect the right L4 nerve root. At L5-S1, there is decompression surgery.  Pedicle screws are noted.  There is only mild foraminal narrowing and no spinal stenosis or nerve root compression.   REVIEW OF SYSTEMS: Constitutional: No fevers, chills, sweats, or change in appetite Eyes: No visual changes, double vision, eye pain Ear, nose and throat: No hearing loss, ear pain, nasal congestion, sore throat Cardiovascular: No chest pain, palpitations Respiratory:  No shortness of breath at rest or with exertion.   No wheezes GastrointestinaI: No nausea, vomiting, diarrhea, abdominal pain, fecal incontinence Genitourinary:  No dysuria, urinary retention or frequency.  No nocturia. Musculoskeletal:  No neck pain, back pain Integumentary: No rash, pruritus, skin lesions Neurological: as above Psychiatric: No depression at this time.  No anxiety Endocrine: No palpitations, diaphoresis, change in appetite, change in weigh or increased thirst Hematologic/Lymphatic:  No anemia, purpura, petechiae. Allergic/Immunologic: No itchy/runny eyes, nasal congestion, recent  allergic reactions, rashes  ALLERGIES: Allergies  Allergen Reactions   Lexiscan [Regadenoson] Itching and Rash    Patient developed mosquite bite type rash on chest, back and leg post lexiscan.    HOME MEDICATIONS:  Current Outpatient Medications:    Accu-Chek Softclix Lancets lancets, USE AS DIRECTED 2-3 TIMES DAILY, Disp: , Rfl:    aspirin 81 MG tablet, Take 81 mg by mouth daily.,  Disp: , Rfl:    atorvastatin (LIPITOR) 40 MG tablet, TAKE 1 TABLET BY MOUTH EVERY DAY FOR CHOLESTEROL CONTROL, Disp: , Rfl:    finasteride (PROSCAR) 5 MG tablet, TAKE 1 TABLET(5 MG) BY MOUTH DAILY, Disp: , Rfl:    glucose blood (ACCU-CHEK AVIVA PLUS) test strip, USE AS DIRECTED 2-3 TIMES DAILY, Disp: , Rfl:    hydrochlorothiazide (HYDRODIURIL) 12.5 MG tablet, Take 12.5 mg by mouth daily., Disp: , Rfl:    LINZESS 145 MCG CAPS capsule, , Disp: , Rfl:    losartan (COZAAR) 100 MG tablet, TAKE 1 TABLET(100 MG) BY MOUTH DAILY, Disp: , Rfl:    meloxicam (MOBIC) 15 MG tablet, Take one tablet daily with a meal for arthritis pain, Disp: , Rfl:    metFORMIN (GLUCOPHAGE-XR) 500 MG 24 hr tablet, Take 1,000 mg by mouth in the morning and at bedtime., Disp: , Rfl:    pantoprazole (PROTONIX) 40 MG tablet, TAKE 1 TABLET BY MOUTH EVERY DAY FOR HEARTBURN, Disp: , Rfl:    tamsulosin (FLOMAX) 0.4 MG CAPS capsule, TAKE 1 CAPSULE(0.4 MG) BY MOUTH 30 MINUTES BEFORE DINNER, Disp: , Rfl:    Polyethyl Glycol-Propyl Glycol 0.4-0.3 % SOLN, Place 1 drop into both eyes 3 (three) times daily as needed (dry eyes). (Patient not taking: Reported on 01/16/2023), Disp: , Rfl:   PAST MEDICAL HISTORY: Past Medical History:  Diagnosis Date   Anxiety disorder    Aortic stenosis    Mild   Cancer (HCC)    Bladder   Chronic back pain    Diabetes mellitus    Type II   GERD (gastroesophageal reflux disease)    Hypercholesterolemia    Hypertension    Melanoma ([place])    Osteoarthritis    Sleep apnea    no cpap    PAST SURGICAL HISTORY: Past Surgical History:  Procedure Laterality Date   BLADDER SURGERY     carpal tunnel surgery     CYSTOSCOPY WITH INSERTION OF UROLIFT  07/18/2022   MELANOMA EXCISION     TONSILLECTOMY     TOTAL KNEE ARTHROPLASTY     left   WRIST SURGERY     Right wrist    FAMILY HISTORY: Family History  Problem Relation Age of Onset   Hypertension Mother     SOCIAL HISTORY: Social History    Socioeconomic History   Marital status: Married    Spouse name: Not on file   Number of children: 2   Years of education: Not on file   Highest education level: Not on file  Occupational History   Occupation: retired Therapist, sports: RETIRED  Tobacco Use   Smoking status: Former    Types: Cigarettes    Quit date: 07/02/1980    Years since quitting: 42.5   Smokeless tobacco: Never  Vaping Use   Vaping Use: Never used  Substance and Sexual Activity   Alcohol use: Yes   Drug use: Never   Sexual activity: Not on file  Other Topics Concern   Not on file  Social History Narrative   Not  on file   Social Determinants of Health   Financial Resource Strain: Not on file  Food Insecurity: Not on file  Transportation Needs: Not on file  Physical Activity: Not on file  Stress: Not on file  Social Connections: Not on file  Intimate Partner Violence: Not on file       PHYSICAL EXAM  Vitals:   01/16/23 1007  Weight: 184 lb 9.6 oz (83.7 kg)  Height: 6' (1.829 m)    Body mass index is 25.04 kg/m.   General: The patient is well-developed and well-nourished and in no acute distress  HEENT:  Head is [place]/AT.  Sclera are anicteric.  Funduscopic exam shows normal optic discs and retinal vessels.  Neck: No carotid bruits are noted.  The neck is nontender.  Reduced ROM looking to the left with crack.     Cardiovascular: The heart has a regular rate and rhythm with a normal S1 and S2. There were no murmurs, gallops or rubs.    Skin: Extremities are without rash or  edema.  Musculoskeletal:  Back is nontender  Neurologic Exam  Mental status: The patient is alert and oriented x 3 at the time of the examination. The patient has apparent normal recent and remote memory, with an apparently normal attention span and concentration ability.   Speech is normal.  Cranial nerves: Extraocular movements are full. Pupils are equal, round, and reactive to light and accomodation. Facial  strnegth and sensation are normal.   No obvious hearing deficits are noted.  Motor:  Muscle bulk is normal.   Tone is normal. Strength is  5 / 5 in the arms.  Strength was 4+/5 in the iliopsoas muscles and the ankle and toe extensors.  Sensory: Sensory testing shows reduced sensation to vibration at knees (30%) and toes 10-20% relative to hands.   He had reduced pinprick in legs relative to hands slightly better at knee than foot.    Coordination: Cerebellar testing reveals good finger-nose-finger and heel-to-shin bilaterally.  Gait and station: Station is normal.   Gait is arthritic and mildly wide.  He cannot tandem. . Romberg is positive.   Reflexes: Deep tendon reflexes are symmetric and increased in arms, 3, and legs, 3+ crossed adductors at knees and 3 at ankles no clonus bilaterally.   Plantar responses are flexor.    DIAGNOSTIC DATA (LABS, IMAGING, TESTING) - I reviewed patient records, labs, notes, testing and imaging myself where available.  Lab Results  Component Value Date   WBC 8.9 11/02/2022   HGB 11.0 (L) 11/02/2022   HCT 31.2 (L) 11/02/2022   MCV 86.0 11/02/2022   PLT 381 11/02/2022      Component Value Date/Time   NA 127 (L) 11/02/2022 1922   K 4.2 11/02/2022 1922   CL 92 (L) 11/02/2022 1922   CO2 26 11/02/2022 1922   GLUCOSE 130 (H) 11/02/2022 1922   BUN 31 (H) 11/02/2022 1922   CREATININE 1.92 (H) 11/02/2022 1922   CALCIUM 9.3 11/02/2022 1922   GFRNONAA 35 (L) 11/02/2022 1922   GFRAA >60 10/21/2018 1326   No results found for: "CHOL", "HDL", "LDLCALC", "LDLDIRECT", "TRIG", "CHOLHDL" No results found for: "HGBA1C" No results found for: "VITAMINB12" No results found for: "TSH"     ASSESSMENT AND PLAN  Gait disturbance - Plan: MR CERVICAL SPINE WO CONTRAST, Vitamin B12, MR BRAIN WO CONTRAST, Multiple Myeloma Panel (SPEP&IFE w/QIG), Rheumatoid factor  Polyneuropathy - Plan: MR CERVICAL SPINE WO CONTRAST, Vitamin B12, MR BRAIN WO CONTRAST,  Multiple  Myeloma Panel (SPEP&IFE w/QIG), Rheumatoid factor  Neck pain - Plan: Rheumatoid factor  In summary, Steve Hunter is a 77 year old man who has had a 1 to 2-year history of progressive gait dysfunction, back and leg pain as well as urinary retention.  Some of his symptoms like the lower back pain and the neurogenic claudication are completely consistent with his known severe spinal stenosis at L3-L4.  However, that spinal stenosis does not adequately explain his ataxic gait, positive Romberg sign, sensory loss and urinary symptoms.  I am most concerned about a superimposed cervical myelopathy which would best explain his hyperreflexia and numbness.  We need to check an MRI of the cervical spine to further evaluate and determine if there is either an intrinsic or extrinsic myelitis/myelopathy.  Additionally because of his symptoms we will check an MRI of the brain to make sure that there is not normal pressure hydrocephalus, stroke or other issue that might be affecting his gait or causing other symptoms.  The numbness in the feet is a little worse than elsewhere and it is possible he has a mild polyneuropathy and we will check B12 and SPEP/IEF.  Follow-up will be scheduled based on the results of the studies.  Regardless of the cervical spine findings, he might benefit from surgery at L3-L4 due to the severe spinal stenosis.  However, if he has a myelopathy that might need to be addressed initially.  Thank you for asking me to see Steve Hunter.  Please let me know if I can be of further assistance with him or other patients in the future.  Inioluwa Baris A. Felecia Shelling, MD, [facility] [id number], [id number] AM Certified in Neurology, Clinical Neurophysiology, Sleep Medicine and Neuroimaging  [facility] [address], [place], [place] [address] [phone]

## 2023-01-22 LAB — MULTIPLE MYELOMA PANEL, SERUM
Albumin SerPl Elph-Mcnc: 3.9 g/dL (ref 2.9–4.4)
Albumin/Glob SerPl: 1.1 (ref 0.7–1.7)
Alpha 1: 0.2 g/dL (ref 0.0–0.4)
Alpha2 Glob SerPl Elph-Mcnc: 1 g/dL (ref 0.4–1.0)
B-Globulin SerPl Elph-Mcnc: 0.9 g/dL (ref 0.7–1.3)
Gamma Glob SerPl Elph-Mcnc: 1.7 g/dL (ref 0.4–1.8)
Globulin, Total: 3.8 g/dL (ref 2.2–3.9)
IgA/Immunoglobulin A, Serum: 241 mg/dL (ref 61–437)
IgG (Immunoglobin G), Serum: 1620 mg/dL — ABNORMAL HIGH (ref 603–1613)
IgM (Immunoglobulin M), Srm: 76 mg/dL (ref 15–143)
Total Protein: 7.7 g/dL (ref 6.0–8.5)

## 2023-01-22 LAB — RHEUMATOID FACTOR: Rheumatoid fact SerPl-aCnc: <10 IU/mL (ref <14.0)

## 2023-01-22 LAB — VITAMIN B12: Vitamin B-12: 329 pg/mL (ref 232–1245)

## 2023-02-08 ENCOUNTER — Other Ambulatory Visit: Payer: Self-pay | Admitting: Neurology

## 2023-02-08 ENCOUNTER — Ambulatory Visit
Admission: RE | Admit: 2023-02-08 | Discharge: 2023-02-08 | Disposition: A | Discharge status: Home / Self Care | Payer: Medicare Other | Source: Ambulatory Visit | Attending: Neurology | Admitting: Neurology

## 2023-02-08 DIAGNOSIS — G629 Polyneuropathy, unspecified: Secondary | ICD-10-CM

## 2023-02-08 DIAGNOSIS — R269 Unspecified abnormalities of gait and mobility: Secondary | ICD-10-CM

## 2023-02-17 ENCOUNTER — Ambulatory Visit
Admission: RE | Admit: 2023-02-17 | Discharge: 2023-02-17 | Disposition: A | Discharge status: Home / Self Care | Payer: PRIVATE HEALTH INSURANCE | Source: Ambulatory Visit | Attending: Neurology | Admitting: Neurology

## 2023-02-17 DIAGNOSIS — R269 Unspecified abnormalities of gait and mobility: Secondary | ICD-10-CM

## 2023-02-17 DIAGNOSIS — G629 Polyneuropathy, unspecified: Secondary | ICD-10-CM

## 2023-02-19 ENCOUNTER — Telehealth: Payer: Self-pay | Admitting: Neurology

## 2023-02-19 NOTE — Telephone Encounter (Signed)
I spoke with [name] about the MRI results.  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  He sees Dr. Shon Baton for lumbar spine issues and has an appointment to see him next week.  I will send a message to Dr. Shon Baton keeping him informed as I believe he will need to proceed with cervical fusion.  IMPRESSION: This MRI of the cervical spine without contrast shows the following: At C3-C4, there is severe spinal stenosis causing compression and associated myelopathic signal within the spinal cord adjacent to C4 towards the right.  Additionally at this level there is severe right foraminal narrowing which could cause right C4 nerve root compression. At C4-C5, there is mild spinal stenosis and other degenerative change causing moderately severe foraminal narrowing to the right with potential for right C5 nerve root compression. Milder degenerative changes at the other cervical levels do not lead to spinal stenosis or nerve root compression.

## 2023-03-01 ENCOUNTER — Ambulatory Visit (HOSPITAL_COMMUNITY): Payer: Self-pay | Admitting: Orthopedic Surgery

## 2023-03-11 NOTE — Pre-Procedure Instructions (Signed)
Surgical Instructions    Your procedure is scheduled on Thursday, Mar 21, 2023 at 1:30 PM.  Report to [facility] Main Entrance "A" at 11:30 A.M., then check in with the Admitting office.  Call this number if you have problems the morning of surgery:  [phone]   If you have any questions prior to your surgery date call [phone]: Open Monday-Friday 8am-4pm  *If you experience any cold or flu symptoms such as cough, fever, chills, shortness of breath, etc. between now and your scheduled surgery, please notify us.*    Remember:  Do not eat after midnight the night before your surgery  You may drink clear liquids until 10:30 AM the morning of your surgery.   Clear liquids allowed are: Water, Non-Citrus Juices (without pulp), Carbonated Beverages, Clear Tea, Black Coffee Only (NO MILK, CREAM OR POWDERED CREAMER of any kind), and Gatorade.    Take these medicines the morning of surgery with A SIP OF WATER:  atorvastatin (LIPITOR)  finasteride (PROSCAR)  pantoprazole (PROTONIX)  tamsulosin (FLOMAX)   IF NEEDED: LORazepam (ATIVAN)  Polyethyl Glycol-Propyl Glycol  traMADol (ULTRAM)   Follow your surgeon's instructions on when to stop Aspirin.  If no instructions were given by your surgeon then you will need to call the office to get those instructions.     As of today, STOP taking any Aleve, Naproxen, Ibuprofen, Motrin, meloxicam (MOBIC), Advil, Goody's, BC's, all herbal medications, fish oil, and all vitamins.  WHAT DO I DO ABOUT MY DIABETES MEDICATION?   Do not take metFORMIN (GLUCOPHAGE-XR) the morning of surgery.   HOW TO MANAGE YOUR DIABETES BEFORE AND AFTER SURGERY  Why is it important to control my blood sugar before and after surgery? Improving blood sugar levels before and after surgery helps healing and can limit problems. A way of improving blood sugar control is eating a healthy diet by:  Eating less sugar and carbohydrates  Increasing activity/exercise   Talking with your doctor about reaching your blood sugar goals High blood sugars (greater than 180 mg/dL) can raise your risk of infections and slow your recovery, so you will need to focus on controlling your diabetes during the weeks before surgery. Make sure that the doctor who takes care of your diabetes knows about your planned surgery including the date and location.  How do I manage my blood sugar before surgery? Check your blood sugar at least 4 times a day, starting 2 days before surgery, to make sure that the level is not too high or low.  Check your blood sugar the morning of your surgery when you wake up and every 2 hours until you get to the Short Stay unit.  If your blood sugar is less than 70 mg/dL, you will need to treat for low blood sugar: Do not take insulin. Treat a low blood sugar (less than 70 mg/dL) with  cup of clear juice (cranberry or apple), 4 glucose tablets, OR glucose gel. Recheck blood sugar in 15 minutes after treatment (to make sure it is greater than 70 mg/dL). If your blood sugar is not greater than 70 mg/dL on recheck, call [phone] for further instructions. Report your blood sugar to the short stay nurse when you get to Short Stay.  If you are admitted to the hospital after surgery: Your blood sugar will be checked by the staff and you will probably be given insulin after surgery (instead of oral diabetes medicines) to make sure you have good blood sugar levels. The goal  for blood sugar control after surgery is 80-180 mg/dL.             Do NOT Smoke (Tobacco/Vaping) for 24 hours prior to your procedure.  If you use a CPAP at night, you may bring your mask/headgear for your overnight stay.   Contacts, glasses, piercing's, hearing aid's, dentures or partials may not be worn into surgery, please bring cases for these belongings.    For patients admitted to the hospital, discharge time will be determined by your treatment team.   Patients discharged  the day of surgery will not be allowed to drive home, and someone needs to stay with them for 24 hours.  SURGICAL WAITING ROOM VISITATION Patients having surgery or a procedure may have 2 support people in the waiting area. Visitors may stay in the waiting area during the procedure and switch out with other visitors if needed. Only 1 support person is allowed in the pre-op area with the patient AFTER the patient is prepped. This person cannot be switched out.  Children under the age of 41 must have an adult accompany them who is not the patient. If the patient needs to stay at the hospital during part of their recovery, the visitor guidelines for inpatient rooms apply.  Please refer to the [facility] website for the visitor guidelines for Inpatients (after your surgery is over and you are in a regular room).   Oral Hygiene is also important to reduce your risk of infection.  Remember - BRUSH YOUR TEETH THE MORNING OF SURGERY WITH YOUR REGULAR TOOTHPASTE    Pre-operative 5 CHG Bath Instructions   You can play a key role in reducing the risk of infection after surgery. Your skin needs to be as free of germs as possible. You can reduce the number of germs on your skin by washing with CHG (chlorhexidine gluconate) soap before surgery. CHG is an antiseptic soap that kills germs and continues to kill germs even after washing.   DO NOT use if you have an allergy to chlorhexidine/CHG or antibacterial soaps. If your skin becomes reddened or irritated, stop using the CHG and notify one of our RNs at [phone].   Please shower with the CHG soap starting 4 days before surgery using the following schedule:     Please keep in mind the following:  DO NOT shave, including legs and underarms, starting the day of your first shower.   You may shave your face at any point before/day of surgery.  Place clean sheets on your bed the day you start using CHG soap. Use a clean washcloth (not used since being  washed) for each shower. DO NOT sleep with pets once you start using the CHG.   CHG Shower Instructions:  If you choose to wash your hair and private area, wash first with your normal shampoo/soap.  After you use shampoo/soap, rinse your hair and body thoroughly to remove shampoo/soap residue.  Turn the water OFF and apply about 3 tablespoons (45 ml) of CHG soap to a CLEAN washcloth.  Apply CHG soap ONLY FROM YOUR NECK DOWN TO YOUR TOES (washing for 3-5 minutes)  DO NOT use CHG soap on face, private areas, open wounds, or sores.  Pay special attention to the area where your surgery is being performed.  If you are having back surgery, having someone wash your back for you may be helpful. Wait 2 minutes after CHG soap is applied, then you may rinse off the CHG soap.  Pat dry  with a clean towel  Put on clean clothes/pajamas   If you choose to wear lotion, please use ONLY the CHG-compatible lotions on the back of this paper.   Additional instructions for the day of surgery: DO NOT APPLY any lotions, deodorants, cologne, or perfumes.   Do not bring valuables to the hospital.  Do not wear nail polish, gel polish, artificial nails, or any other type of covering on natural nails (fingers and toes) Do not wear jewelry or makeup Put on clean/comfortable clothes.  Please brush your teeth.  Ask your nurse before applying any prescription medications to the skin.     CHG Compatible Lotions   Aveeno Moisturizing lotion  Cetaphil Moisturizing Cream  Cetaphil Moisturizing Lotion  Clairol Herbal Essence Moisturizing Lotion, Dry Skin  Clairol Herbal Essence Moisturizing Lotion, Extra Dry Skin  Clairol Herbal Essence Moisturizing Lotion, Normal Skin  Curel Age Defying Therapeutic Moisturizing Lotion with Alpha Hydroxy  Curel Extreme Care Body Lotion  Curel Soothing Hands Moisturizing Hand Lotion  Curel Therapeutic Moisturizing Cream, Fragrance-Free  Curel Therapeutic Moisturizing Lotion,  Fragrance-Free  Curel Therapeutic Moisturizing Lotion, Original Formula  Eucerin Daily Replenishing Lotion  Eucerin Dry Skin Therapy Plus Alpha Hydroxy Crme  Eucerin Dry Skin Therapy Plus Alpha Hydroxy Lotion  Eucerin Original Crme  Eucerin Original Lotion  Eucerin Plus Crme Eucerin Plus Lotion  Eucerin TriLipid Replenishing Lotion  Keri Anti-Bacterial Hand Lotion  Keri Deep Conditioning Original Lotion Dry Skin Formula Softly Scented  Keri Deep Conditioning Original Lotion, Fragrance Free Sensitive Skin Formula  Keri Lotion Fast Absorbing Fragrance Free Sensitive Skin Formula  Keri Lotion Fast Absorbing Softly Scented Dry Skin Formula  Keri Original Lotion  Keri Skin Renewal Lotion Keri Silky Smooth Lotion  Keri Silky Smooth Sensitive Skin Lotion  Nivea Body Creamy Conditioning Oil  Nivea Body Extra Enriched Lotion  Nivea Body Original Lotion  Nivea Body Sheer Moisturizing Lotion Nivea Crme  Nivea Skin Firming Lotion  NutraDerm 30 Skin Lotion  NutraDerm Skin Lotion  NutraDerm Therapeutic Skin Cream  NutraDerm Therapeutic Skin Lotion  ProShield Protective Hand Cream  Provon moisturizing lotion    Please read over the following fact sheets that you were given.  If you received a COVID test during your pre-op visit  it is requested that you wear a mask when out in public, stay away from anyone that may not be feeling well and notify your surgeon if you develop symptoms. If you have been in contact with anyone that has tested positive in the last 10 days please notify you surgeon.

## 2023-03-12 ENCOUNTER — Encounter (HOSPITAL_COMMUNITY): Payer: Self-pay

## 2023-03-12 ENCOUNTER — Encounter (HOSPITAL_COMMUNITY)
Admission: RE | Admit: 2023-03-12 | Discharge: 2023-03-12 | Disposition: A | Discharge status: Home / Self Care | Payer: Medicare Other | Source: Ambulatory Visit | Attending: Orthopedic Surgery | Admitting: Orthopedic Surgery

## 2023-03-12 ENCOUNTER — Other Ambulatory Visit: Payer: Self-pay

## 2023-03-12 VITALS — BP 123/71 | HR 78 | Temp 97.8°F | Resp 18 | Ht 72.0 in | Wt 180.6 lb

## 2023-03-12 DIAGNOSIS — E7800 Pure hypercholesterolemia, unspecified: Secondary | ICD-10-CM | POA: Insufficient documentation

## 2023-03-12 DIAGNOSIS — E119 Type 2 diabetes mellitus without complications: Secondary | ICD-10-CM | POA: Insufficient documentation

## 2023-03-12 DIAGNOSIS — Z87891 Personal history of nicotine dependence: Secondary | ICD-10-CM | POA: Insufficient documentation

## 2023-03-12 DIAGNOSIS — Z01818 Encounter for other preprocedural examination: Secondary | ICD-10-CM | POA: Insufficient documentation

## 2023-03-12 DIAGNOSIS — G8929 Other chronic pain: Secondary | ICD-10-CM | POA: Insufficient documentation

## 2023-03-12 DIAGNOSIS — Z79899 Other long term (current) drug therapy: Secondary | ICD-10-CM | POA: Diagnosis not present

## 2023-03-12 DIAGNOSIS — F419 Anxiety disorder, unspecified: Secondary | ICD-10-CM | POA: Diagnosis not present

## 2023-03-12 DIAGNOSIS — Z85820 Personal history of malignant melanoma of skin: Secondary | ICD-10-CM | POA: Insufficient documentation

## 2023-03-12 DIAGNOSIS — I350 Nonrheumatic aortic (valve) stenosis: Secondary | ICD-10-CM | POA: Diagnosis not present

## 2023-03-12 DIAGNOSIS — M4712 Other spondylosis with myelopathy, cervical region: Secondary | ICD-10-CM | POA: Diagnosis not present

## 2023-03-12 DIAGNOSIS — I444 Left anterior fascicular block: Secondary | ICD-10-CM | POA: Insufficient documentation

## 2023-03-12 DIAGNOSIS — I10 Essential (primary) hypertension: Secondary | ICD-10-CM | POA: Insufficient documentation

## 2023-03-12 DIAGNOSIS — M4802 Spinal stenosis, cervical region: Secondary | ICD-10-CM | POA: Diagnosis not present

## 2023-03-12 DIAGNOSIS — K219 Gastro-esophageal reflux disease without esophagitis: Secondary | ICD-10-CM | POA: Diagnosis not present

## 2023-03-12 LAB — BASIC METABOLIC PANEL
Anion gap: 10 (ref 5–15)
BUN: 23 mg/dL (ref 8–23)
CO2: 26 mmol/L (ref 22–32)
Calcium: 9.5 mg/dL (ref 8.9–10.3)
Chloride: 99 mmol/L (ref 98–111)
Creatinine, Ser: 1.52 mg/dL — ABNORMAL HIGH (ref 0.61–1.24)
GFR, Estimated: 47 mL/min — ABNORMAL LOW (ref >60)
Glucose, Bld: 116 mg/dL — ABNORMAL HIGH (ref 70–99)
Potassium: 3.9 mmol/L (ref 3.5–5.1)
Sodium: 135 mmol/L (ref 135–145)

## 2023-03-12 LAB — CBC
HCT: 36.5 % — ABNORMAL LOW (ref 39.0–52.0)
Hemoglobin: 12.4 g/dL — ABNORMAL LOW (ref 13.0–17.0)
MCH: 30.5 pg (ref 26.0–34.0)
MCHC: 34 g/dL (ref 30.0–36.0)
MCV: 89.7 fL (ref 80.0–100.0)
Platelets: 372 10*3/uL (ref 150–400)
RBC: 4.07 MIL/uL — ABNORMAL LOW (ref 4.22–5.81)
RDW: 12.3 % (ref 11.5–15.5)
WBC: 11.3 10*3/uL — ABNORMAL HIGH (ref 4.0–10.5)
nRBC: 0 % (ref 0.0–0.2)

## 2023-03-12 LAB — GLUCOSE, CAPILLARY: Glucose-Capillary: 147 mg/dL — ABNORMAL HIGH (ref 70–99)

## 2023-03-12 LAB — SURGICAL PCR SCREEN
MRSA, PCR: NEGATIVE
Staphylococcus aureus: NEGATIVE

## 2023-03-12 LAB — HEMOGLOBIN A1C
Hgb A1c MFr Bld: 6.3 % — ABNORMAL HIGH (ref 4.8–5.6)
Mean Plasma Glucose: 134.11 mg/dL

## 2023-03-12 NOTE — Progress Notes (Signed)
PCP - Dr. Benedetto Goad Cardiologist - Dr. Peter Swaziland Urologist: Dr. Bjorn Pippin GI: Dr. Rinaldo Ratel  PPM/ICD - Denies  Chest x-ray - N/A EKG - 01/07/23 Stress Test - 06/04/12 ECHO - 07/04/15 Cardiac Cath - Denies  Sleep Study - Hx OSA CPAP - No  Fasting Blood Sugar - 120-140 Checks Blood Sugar _1____ time a day  Last dose of GLP1 agonist-  N/A GLP1 instructions: N/A  Blood Thinner Instructions: N/A Aspirin Instructions: Stop 7 days prior to surgery per MD. Last dose should be on 03/13/23.  ERAS Protcol - Yes PRE-SURGERY Ensure or G2- No  COVID TEST- N/A   Anesthesia review: Yes, cardiac hx; clearance 01/07/23  Patient denies shortness of breath, fever, cough and chest pain at PAT appointment   All instructions explained to the patient, with a verbal understanding of the material. Patient agrees to go over the instructions while at home for a better understanding. Patient also instructed to self quarantine after being tested for COVID-19. The opportunity to ask questions was provided.

## 2023-03-13 NOTE — Progress Notes (Signed)
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  History includes former smoker (quit 07/02/80), HTN, cholesterolemia, aortic stenosis (very mild AS, mean grad 6 mmHg, peak grad 15 mmHg 06/2015), GERD, DM2, OSA (s/p uvulectomy; does not use CPAP), bladder cancer (s/p TURBT 2001; s/p Urolift and bladder biopsy/fulguration 07/18/22), melanoma (s/p excision), anxiety, chronic back pain, spinal surgery (L4-S1 PLIF 04/10/16), arthritis (left TKA 2000). Lab trends suggest CKD (Creatinine ~ 1.6 - 1.9 since 05/2022).   Last cardiology evaluation with Dr. Swaziland was on 01/07/2023.  He noted with patient had minimal aortic stenosis by echocardiogram in 2016.  There is no change in exam and patient remained asymptomatic.  Hypertension was well-controlled.  He recommended continue risk factor modification.  In regards to upcoming back and bladder surgeries he wrote, "Patient is a suitable candidate for back surgery and or Bladder surgery if needed.  No additional cardiac testing needed.  I think he is low risk."  Reported last ASA scheduled for 03/13/2023.  Anesthesia team to evaluate on the day of surgery.   VS: BP 123/71   Pulse 78   Temp 36.6 C   Resp 18   Ht 6' (1.829 m)   Wt 81.9 kg   SpO2 99%   BMI 24.49 kg/m    PROVIDERS: Barbie Banner, MD is PCP  [place]iland, Peter, MD is cardiologist Bjorn Pippin, MD is urologist. Also seen by Jannetta Quint, MD with [facility].   Rinaldo Ratel, MD is GI Despina Arias, MD is neurologist   LABS: Preoperative labs noted. Cr 1.52, previously 1.92 on 11/02/22 and 1.61 on 07/03/22 ([facility]).  (all labs ordered are listed, but only abnormal results  are displayed)  Labs Reviewed  GLUCOSE, CAPILLARY - Abnormal; Notable for the following components:      Result Value   Glucose-Capillary 147 (*)    All other components within normal limits  HEMOGLOBIN A1C - Abnormal; Notable for the following components:   Hgb A1c MFr Bld 6.3 (*)    All other components within normal limits  BASIC METABOLIC PANEL - Abnormal; Notable for the following components:   Glucose, Bld 116 (*)    Creatinine, Ser 1.52 (*)    GFR, Estimated 47 (*)    All other components within normal limits  CBC - Abnormal; Notable for the following components:   WBC 11.3 (*)    RBC 4.07 (*)    Hemoglobin 12.4 (*)    HCT 36.5 (*)    All other components within normal limits  SURGICAL PCR SCREEN    IMAGES: MRI C-spine 02/17/23: IMPRESSION: At C3-C4, there is severe spinal stenosis causing compression and associated myelopathic signal within the spinal cord adjacent to C4 towards the right.  Additionally at this level there is severe right foraminal narrowing which could cause right C4 nerve root compression. At C4-C5, there is mild spinal stenosis and other degenerative change causing moderately severe foraminal narrowing to the right with potential for right C5 nerve root compression. Milder degenerative changes at the other cervical levels do not lead to spinal stenosis or nerve root compression.  MRI Brain 02/08/23: IMPRESSION: Single and confluent T2/FLAIR hypertense foci predominantly in the  deep white matter of the cerebral hemispheres consistent with moderate chronic microvascular ischemic change, more than typical for age.  Large confluencies are noted in the periatrial white matter. Mild generalized cortical atrophy, typical for age. No acute findings.   CT L-spine 01/08/23: IMPRESSION: Degenerative and postsurgical changes. No acute osseous abnormalities identified.    EKG: EKG 01/07/2023: Normal sinus rhythm Left anterior fascicular block   CV: Echo  07/04/15: Study Conclusions  - Left ventricle: The cavity size was normal. Wall thickness was    increased in a pattern of mild LVH. Systolic function was normal.    The estimated ejection fraction was in the range of 55% to 60%.    Wall motion was normal; there were no regional wall motion    abnormalities. Doppler parameters are consistent with abnormal    left ventricular relaxation (grade 1 diastolic dysfunction).  - Aortic valve: There was very mild stenosis. There was no regurgitation.    VTI ratio of LVOT to aortic valve: 0.62. Valve area (VTI): 2.37 cm^2. Indexed valve area (VTI): 1.04 cm^2/m^2. Peak velocity ratio of LVOT to aortic valve: 0.54. Valve area (Vmax): 2.04 cm^2. Indexed valve area  (Vmax): 0.9 cm^2/m^2. Mean velocity ratio of LVOT to aortic valve:  0.6. Valve area (Vmean): 2.27 cm^2. Indexed valve area (Vmean): 1  cm^2/m^2.    Mean gradient (S): 6 mm Hg. Peak gradient (S): 15 mm  Hg.    Nuclear stress test 06/04/12: Overall Impression:  Normal stress nuclear study.  No evidence of ischemia.  Normal LV function. LV Ejection Fraction: 55%.  LV Wall Motion:  NL LV Function; NL Wall Motion    Past Medical History:  Diagnosis Date   Anxiety disorder    Aortic stenosis    Mild   Cancer (HCC)    Bladder   Chronic back pain    Diabetes mellitus    Type II   GERD (gastroesophageal reflux disease)    Hypercholesterolemia    Hypertension    Melanoma (HCC)    Osteoarthritis    Sleep apnea    no cpap    Past Surgical History:  Procedure Laterality Date   BACK SURGERY     BLADDER SURGERY     carpal tunnel surgery     CATARACT EXTRACTION W/ INTRAOCULAR LENS IMPLANT Bilateral    5 years ago   CYSTOSCOPY WITH INSERTION OF UROLIFT  07/18/2022   MELANOMA EXCISION     SHOULDER SURGERY Left    TONSILLECTOMY     TOTAL KNEE ARTHROPLASTY     left   WRIST SURGERY     Right wrist    MEDICATIONS:  Accu-Chek Softclix Lancets lancets   aspirin 81 MG tablet    atorvastatin (LIPITOR) 40 MG tablet   finasteride (PROSCAR) 5 MG tablet   glucose blood (ACCU-CHEK AVIVA PLUS) test strip   hydrochlorothiazide (HYDRODIURIL) 12.5 MG tablet   linaclotide (LINZESS) 72 MCG capsule   LORazepam (ATIVAN) 0.5 MG tablet   losartan (COZAAR) 100 MG tablet   meloxicam (MOBIC) 15 MG tablet   metFORMIN (GLUCOPHAGE-XR) 500 MG 24 hr tablet   pantoprazole (PROTONIX) 40 MG tablet   Polyethyl Glycol-Propyl Glycol 0.4-0.3 % SOLN   tamsulosin (FLOMAX) 0.4 MG CAPS capsule   traMADol (ULTRAM) 50 MG tablet   zolpidem (AMBIEN) 10 MG tablet   No current facility-administered medications for this encounter.    Shonna Chock, PA-C Surgical Short Stay/Anesthesiology [facility] Phone [phone] [facility] Phone [phone] 03/13/2023 6:04 PM

## 2023-03-13 NOTE — Anesthesia Preprocedure Evaluation (Addendum)
Anesthesia Evaluation  Patient identified by MRN, date of birth, ID band Patient awake    Reviewed: Allergy & Precautions, NPO status , Patient's Chart, lab work & pertinent test results  Airway Mallampati: I       Dental no notable dental hx.    Pulmonary former smoker   Pulmonary exam normal        Cardiovascular hypertension, Pt. on medications  Rhythm:Regular Rate:Normal + Systolic murmurs    Neuro/Psych  PSYCHIATRIC DISORDERS Anxiety     negative neurological ROS     GI/Hepatic ,GERD  Medicated and Controlled,,  Endo/Other  diabetes, Type 2, Oral Hypoglycemic Agents    Renal/GU      Musculoskeletal  (+) Arthritis , Osteoarthritis,    Abdominal Normal abdominal exam  (+)   Peds  Hematology  (+) Blood dyscrasia, anemia   Anesthesia Other Findings   Reproductive/Obstetrics                             Anesthesia Physical Anesthesia Plan  ASA: 3  Anesthesia Plan: General   Post-op Pain Management: Dilaudid IV   Induction: Intravenous  PONV Risk Score and Plan: 3 and Ondansetron and Treatment may vary due to age or medical condition  Airway Management Planned: Oral ETT  Additional Equipment: None  Intra-op Plan:   Post-operative Plan: Extubation in OR  Informed Consent: I have reviewed the patients History and Physical, chart, labs and discussed the procedure including the risks, benefits and alternatives for the proposed anesthesia with the patient or authorized representative who has indicated his/her understanding and acceptance.     Dental advisory given  Plan Discussed with: CRNA  Anesthesia Plan Comments: (PAT note written 03/13/2023 by Shonna Chock, PA-C.  )       Anesthesia Quick Evaluation

## 2023-03-21 ENCOUNTER — Observation Stay (HOSPITAL_COMMUNITY)
Admission: RE | Admit: 2023-03-21 | Discharge: 2023-03-23 | Disposition: A | Discharge status: Home / Self Care | Payer: Medicare Other | Attending: Orthopedic Surgery | Admitting: Orthopedic Surgery

## 2023-03-21 ENCOUNTER — Encounter (HOSPITAL_COMMUNITY)
Admission: RE | Disposition: A | Discharge status: Home / Self Care | Payer: Self-pay | Source: Home / Self Care | Attending: Orthopedic Surgery

## 2023-03-21 ENCOUNTER — Other Ambulatory Visit: Payer: Self-pay

## 2023-03-21 ENCOUNTER — Ambulatory Visit (HOSPITAL_COMMUNITY): Payer: Medicare Other | Admitting: Vascular Surgery

## 2023-03-21 ENCOUNTER — Ambulatory Visit (HOSPITAL_COMMUNITY): Payer: Medicare Other

## 2023-03-21 ENCOUNTER — Encounter (HOSPITAL_COMMUNITY): Payer: Self-pay | Admitting: Orthopedic Surgery

## 2023-03-21 ENCOUNTER — Ambulatory Visit (HOSPITAL_BASED_OUTPATIENT_CLINIC_OR_DEPARTMENT_OTHER): Payer: Medicare Other | Admitting: Registered Nurse

## 2023-03-21 DIAGNOSIS — E119 Type 2 diabetes mellitus without complications: Secondary | ICD-10-CM

## 2023-03-21 DIAGNOSIS — I10 Essential (primary) hypertension: Secondary | ICD-10-CM | POA: Insufficient documentation

## 2023-03-21 DIAGNOSIS — Z85828 Personal history of other malignant neoplasm of skin: Secondary | ICD-10-CM | POA: Insufficient documentation

## 2023-03-21 DIAGNOSIS — M4712 Other spondylosis with myelopathy, cervical region: Principal | ICD-10-CM | POA: Insufficient documentation

## 2023-03-21 DIAGNOSIS — Z79899 Other long term (current) drug therapy: Secondary | ICD-10-CM | POA: Diagnosis not present

## 2023-03-21 DIAGNOSIS — Z7982 Long term (current) use of aspirin: Secondary | ICD-10-CM | POA: Insufficient documentation

## 2023-03-21 DIAGNOSIS — Z87891 Personal history of nicotine dependence: Secondary | ICD-10-CM | POA: Insufficient documentation

## 2023-03-21 DIAGNOSIS — G959 Disease of spinal cord, unspecified: Secondary | ICD-10-CM | POA: Diagnosis present

## 2023-03-21 DIAGNOSIS — Z7984 Long term (current) use of oral hypoglycemic drugs: Secondary | ICD-10-CM

## 2023-03-21 DIAGNOSIS — Z96652 Presence of left artificial knee joint: Secondary | ICD-10-CM | POA: Diagnosis not present

## 2023-03-21 DIAGNOSIS — Z981 Arthrodesis status: Principal | ICD-10-CM

## 2023-03-21 DIAGNOSIS — Z8551 Personal history of malignant neoplasm of bladder: Secondary | ICD-10-CM | POA: Insufficient documentation

## 2023-03-21 HISTORY — PX: ANTERIOR CERVICAL DECOMP/DISCECTOMY FUSION: SHX1161

## 2023-03-21 LAB — GLUCOSE, CAPILLARY
Glucose-Capillary: 155 mg/dL — ABNORMAL HIGH (ref 70–99)
Glucose-Capillary: 141 mg/dL — ABNORMAL HIGH (ref 70–99)
Glucose-Capillary: 155 mg/dL — ABNORMAL HIGH (ref 70–99)
Glucose-Capillary: 174 mg/dL — ABNORMAL HIGH (ref 70–99)
Glucose-Capillary: 202 mg/dL — ABNORMAL HIGH (ref 70–99)

## 2023-03-21 LAB — URINALYSIS, ROUTINE W REFLEX MICROSCOPIC
Bilirubin Urine: NEGATIVE
Glucose, UA: NEGATIVE mg/dL
Ketones, ur: NEGATIVE mg/dL
Nitrite: NEGATIVE
Protein, ur: 100 mg/dL — AB
RBC / HPF: >50 RBC/hpf (ref 0–5)
Specific Gravity, Urine: 1.012 (ref 1.005–1.030)
WBC, UA: >50 WBC/hpf (ref 0–5)
pH: 6 (ref 5.0–8.0)

## 2023-03-21 SURGERY — ANTERIOR CERVICAL DECOMPRESSION/DISCECTOMY FUSION 1 LEVEL
Anesthesia: General

## 2023-03-21 MED ORDER — INSULIN ASPART 100 UNIT/ML IJ SOLN: 0.0000 [IU] | INTRAMUSCULAR | Status: DC | PRN

## 2023-03-21 MED ORDER — LACTATED RINGERS IV SOLN: INTRAVENOUS | Status: DC | PRN

## 2023-03-21 MED ORDER — METFORMIN HCL ER 500 MG PO TB24
1000.0000 mg | ORAL_TABLET | Freq: Every day | ORAL | Status: DC
  Administered 2023-03-22 – 2023-03-23 (×2): 1000 mg via ORAL
  Filled 2023-03-21 (×2): qty 2

## 2023-03-21 MED ORDER — HYDROMORPHONE HCL 1 MG/ML IJ SOLN
1.0000 mg | INTRAMUSCULAR | Status: DC | PRN
  Administered 2023-03-22: 1 mg via INTRAVENOUS
  Filled 2023-03-21 (×2): qty 1

## 2023-03-21 MED ORDER — HYDROCHLOROTHIAZIDE 12.5 MG PO TABS
12.5000 mg | ORAL_TABLET | Freq: Every day | ORAL | Status: DC
  Administered 2023-03-21 – 2023-03-23 (×3): 12.5 mg via ORAL
  Filled 2023-03-21 (×3): qty 1

## 2023-03-21 MED ORDER — METHOCARBAMOL 1000 MG/10ML IJ SOLN
500.0000 mg | Freq: Four times a day (QID) | INTRAVENOUS | Status: DC | PRN
  Filled 2023-03-21: qty 5

## 2023-03-21 MED ORDER — LACTATED RINGERS IV SOLN: INTRAVENOUS | Status: DC

## 2023-03-21 MED ORDER — LIDOCAINE 2% (20 MG/ML) 5 ML SYRINGE
INTRAMUSCULAR | Status: DC | PRN
  Administered 2023-03-21: 100 mg via INTRAVENOUS

## 2023-03-21 MED ORDER — ACETAMINOPHEN 650 MG RE SUPP: 650.0000 mg | RECTAL | Status: DC | PRN

## 2023-03-21 MED ORDER — BUPIVACAINE-EPINEPHRINE 0.25% -1:200000 IJ SOLN
INTRAMUSCULAR | Status: DC | PRN
  Administered 2023-03-21: 4 mL

## 2023-03-21 MED ORDER — PANTOPRAZOLE SODIUM 40 MG PO TBEC
40.0000 mg | DELAYED_RELEASE_TABLET | Freq: Two times a day (BID) | ORAL | Status: DC
  Administered 2023-03-21 – 2023-03-23 (×4): 40 mg via ORAL
  Filled 2023-03-21 (×4): qty 1

## 2023-03-21 MED ORDER — ACETAMINOPHEN 10 MG/ML IV SOLN: 1000.0000 mg | Freq: Once | INTRAVENOUS | Status: DC | PRN

## 2023-03-21 MED ORDER — INSULIN ASPART 100 UNIT/ML IJ SOLN: 0.0000 [IU] | Freq: Every day | INTRAMUSCULAR | Status: DC

## 2023-03-21 MED ORDER — ORAL CARE MOUTH RINSE: 15.0000 mL | Freq: Once | OROMUCOSAL | Status: AC

## 2023-03-21 MED ORDER — PROMETHAZINE HCL 25 MG/ML IJ SOLN: 6.2500 mg | INTRAMUSCULAR | Status: DC | PRN

## 2023-03-21 MED ORDER — TAMSULOSIN HCL 0.4 MG PO CAPS
0.4000 mg | ORAL_CAPSULE | Freq: Every day | ORAL | Status: DC
  Administered 2023-03-22 – 2023-03-23 (×2): 0.4 mg via ORAL
  Filled 2023-03-21 (×3): qty 1

## 2023-03-21 MED ORDER — ALBUMIN HUMAN 5 % IV SOLN: INTRAVENOUS | Status: DC | PRN

## 2023-03-21 MED ORDER — MENTHOL 3 MG MT LOZG: 1.0000 | LOZENGE | OROMUCOSAL | Status: DC | PRN

## 2023-03-21 MED ORDER — ONDANSETRON HCL 4 MG/2ML IJ SOLN: 4.0000 mg | Freq: Four times a day (QID) | INTRAMUSCULAR | Status: DC | PRN

## 2023-03-21 MED ORDER — METHOCARBAMOL 500 MG PO TABS
ORAL_TABLET | ORAL | Status: AC
  Filled 2023-03-21: qty 1

## 2023-03-21 MED ORDER — CHLORHEXIDINE GLUCONATE 0.12 % MT SOLN
15.0000 mL | Freq: Once | OROMUCOSAL | Status: AC
  Administered 2023-03-21: 15 mL via OROMUCOSAL
  Filled 2023-03-21: qty 15

## 2023-03-21 MED ORDER — DEXAMETHASONE SODIUM PHOSPHATE 10 MG/ML IJ SOLN
INTRAMUSCULAR | Status: DC | PRN
  Administered 2023-03-21: 5 mg via INTRAVENOUS

## 2023-03-21 MED ORDER — CEFAZOLIN SODIUM-DEXTROSE 1-4 GM/50ML-% IV SOLN
1.0000 g | Freq: Three times a day (TID) | INTRAVENOUS | Status: AC
  Administered 2023-03-21 – 2023-03-22 (×2): 1 g via INTRAVENOUS
  Filled 2023-03-21 (×2): qty 50

## 2023-03-21 MED ORDER — BUPIVACAINE-EPINEPHRINE (PF) 0.25% -1:200000 IJ SOLN
INTRAMUSCULAR | Status: AC
  Filled 2023-03-21: qty 30

## 2023-03-21 MED ORDER — MAGNESIUM CITRATE PO SOLN: 1.0000 | Freq: Once | ORAL | Status: DC | PRN

## 2023-03-21 MED ORDER — OXYCODONE-ACETAMINOPHEN 10-325 MG PO TABS: 1.0000 | ORAL_TABLET | Freq: Four times a day (QID) | ORAL | 0 refills | Status: AC | PRN

## 2023-03-21 MED ORDER — HYDROMORPHONE HCL 1 MG/ML IJ SOLN
INTRAMUSCULAR | Status: AC
  Filled 2023-03-21: qty 1

## 2023-03-21 MED ORDER — THROMBIN 20000 UNITS EX SOLR
CUTANEOUS | Status: AC
  Filled 2023-03-21: qty 20000

## 2023-03-21 MED ORDER — METFORMIN HCL ER 500 MG PO TB24: 500.0000 mg | ORAL_TABLET | ORAL | Status: DC

## 2023-03-21 MED ORDER — LOSARTAN POTASSIUM 50 MG PO TABS
100.0000 mg | ORAL_TABLET | Freq: Every day | ORAL | Status: DC
  Administered 2023-03-21 – 2023-03-23 (×3): 100 mg via ORAL
  Filled 2023-03-21 (×3): qty 2

## 2023-03-21 MED ORDER — OXYCODONE HCL 5 MG PO TABS
5.0000 mg | ORAL_TABLET | ORAL | Status: DC | PRN
  Administered 2023-03-22 – 2023-03-23 (×4): 5 mg via ORAL
  Filled 2023-03-21 (×4): qty 1

## 2023-03-21 MED ORDER — 0.9 % SODIUM CHLORIDE (POUR BTL) OPTIME
TOPICAL | Status: DC | PRN
  Administered 2023-03-21: 1000 mL

## 2023-03-21 MED ORDER — ROCURONIUM BROMIDE 10 MG/ML (PF) SYRINGE
PREFILLED_SYRINGE | INTRAVENOUS | Status: DC | PRN
  Administered 2023-03-21: 20 mg via INTRAVENOUS
  Administered 2023-03-21: 50 mg via INTRAVENOUS

## 2023-03-21 MED ORDER — METHOCARBAMOL 500 MG PO TABS: 500.0000 mg | ORAL_TABLET | Freq: Three times a day (TID) | ORAL | 0 refills | Status: AC | PRN

## 2023-03-21 MED ORDER — SUGAMMADEX SODIUM 200 MG/2ML IV SOLN
INTRAVENOUS | Status: DC | PRN
  Administered 2023-03-21: 200 mg via INTRAVENOUS

## 2023-03-21 MED ORDER — INSULIN ASPART 100 UNIT/ML IJ SOLN
0.0000 [IU] | Freq: Three times a day (TID) | INTRAMUSCULAR | Status: DC
  Administered 2023-03-21: 3 [IU] via SUBCUTANEOUS

## 2023-03-21 MED ORDER — FENTANYL CITRATE (PF) 250 MCG/5ML IJ SOLN
INTRAMUSCULAR | Status: DC | PRN
  Administered 2023-03-21: 50 ug via INTRAVENOUS
  Administered 2023-03-21: 100 ug via INTRAVENOUS

## 2023-03-21 MED ORDER — METFORMIN HCL ER 500 MG PO TB24
500.0000 mg | ORAL_TABLET | Freq: Every day | ORAL | Status: DC
  Administered 2023-03-22: 500 mg via ORAL
  Filled 2023-03-21: qty 1

## 2023-03-21 MED ORDER — METHOCARBAMOL 500 MG PO TABS
500.0000 mg | ORAL_TABLET | Freq: Four times a day (QID) | ORAL | Status: DC | PRN
  Administered 2023-03-21 – 2023-03-23 (×5): 500 mg via ORAL
  Filled 2023-03-21 (×4): qty 1

## 2023-03-21 MED ORDER — POLYETHYLENE GLYCOL 3350 17 G PO PACK
17.0000 g | PACK | Freq: Every day | ORAL | Status: DC | PRN
  Filled 2023-03-21: qty 1

## 2023-03-21 MED ORDER — SODIUM CHLORIDE 0.9% FLUSH: 3.0000 mL | INTRAVENOUS | Status: DC | PRN

## 2023-03-21 MED ORDER — PROPOFOL 10 MG/ML IV BOLUS
INTRAVENOUS | Status: DC | PRN
  Administered 2023-03-21: 25 ug/kg/min via INTRAVENOUS
  Administered 2023-03-21: 200 mg via INTRAVENOUS

## 2023-03-21 MED ORDER — ACETAMINOPHEN 325 MG PO TABS
650.0000 mg | ORAL_TABLET | ORAL | Status: DC | PRN
  Administered 2023-03-22: 650 mg via ORAL
  Filled 2023-03-21: qty 2

## 2023-03-21 MED ORDER — ONDANSETRON HCL 4 MG/2ML IJ SOLN
INTRAMUSCULAR | Status: DC | PRN
  Administered 2023-03-21: 4 mg via INTRAVENOUS

## 2023-03-21 MED ORDER — HYDROMORPHONE HCL 1 MG/ML IJ SOLN
0.2500 mg | INTRAMUSCULAR | Status: DC | PRN
  Administered 2023-03-21: 0.25 mg via INTRAVENOUS
  Administered 2023-03-21: 0.5 mg via INTRAVENOUS
  Administered 2023-03-21: 0.25 mg via INTRAVENOUS

## 2023-03-21 MED ORDER — CEFAZOLIN SODIUM-DEXTROSE 2-4 GM/100ML-% IV SOLN
2.0000 g | INTRAVENOUS | Status: AC
  Administered 2023-03-21: 2 g via INTRAVENOUS
  Filled 2023-03-21: qty 100

## 2023-03-21 MED ORDER — ACETAMINOPHEN 10 MG/ML IV SOLN
INTRAVENOUS | Status: DC | PRN
  Administered 2023-03-21: 1000 mg via INTRAVENOUS

## 2023-03-21 MED ORDER — FENTANYL CITRATE (PF) 250 MCG/5ML IJ SOLN
INTRAMUSCULAR | Status: AC
  Filled 2023-03-21: qty 5

## 2023-03-21 MED ORDER — ONDANSETRON HCL 4 MG PO TABS
4.0000 mg | ORAL_TABLET | Freq: Four times a day (QID) | ORAL | Status: DC | PRN
  Administered 2023-03-22: 4 mg via ORAL
  Filled 2023-03-21: qty 1

## 2023-03-21 MED ORDER — OXYCODONE HCL 5 MG PO TABS
10.0000 mg | ORAL_TABLET | ORAL | Status: DC | PRN
  Administered 2023-03-21 – 2023-03-22 (×4): 10 mg via ORAL
  Filled 2023-03-21 (×4): qty 2

## 2023-03-21 MED ORDER — PHENOL 1.4 % MT LIQD: 1.0000 | OROMUCOSAL | Status: DC | PRN

## 2023-03-21 MED ORDER — ATORVASTATIN CALCIUM 40 MG PO TABS
40.0000 mg | ORAL_TABLET | Freq: Every day | ORAL | Status: DC
  Administered 2023-03-22: 40 mg via ORAL
  Filled 2023-03-21: qty 1

## 2023-03-21 MED ORDER — PHENYLEPHRINE HCL-NACL 20-0.9 MG/250ML-% IV SOLN
INTRAVENOUS | Status: DC | PRN
  Administered 2023-03-21: 160 ug via INTRAVENOUS
  Administered 2023-03-21: 50 ug/min via INTRAVENOUS
  Administered 2023-03-21 (×2): 160 ug via INTRAVENOUS

## 2023-03-21 MED ORDER — EPHEDRINE SULFATE-NACL 50-0.9 MG/10ML-% IV SOSY
PREFILLED_SYRINGE | INTRAVENOUS | Status: DC | PRN
  Administered 2023-03-21 (×4): 5 mg via INTRAVENOUS

## 2023-03-21 MED ORDER — LINACLOTIDE 72 MCG PO CAPS
72.0000 ug | ORAL_CAPSULE | Freq: Every day | ORAL | Status: DC | PRN
  Filled 2023-03-21: qty 1

## 2023-03-21 MED ORDER — ATORVASTATIN CALCIUM 40 MG PO TABS: 40.0000 mg | ORAL_TABLET | Freq: Every day | ORAL | Status: DC

## 2023-03-21 MED ORDER — SODIUM CHLORIDE 0.9 % IV SOLN: 250.0000 mL | INTRAVENOUS | Status: DC

## 2023-03-21 MED ORDER — ONDANSETRON HCL 4 MG PO TABS: 4.0000 mg | ORAL_TABLET | Freq: Three times a day (TID) | ORAL | 0 refills | Status: DC | PRN

## 2023-03-21 MED ORDER — FINASTERIDE 5 MG PO TABS
5.0000 mg | ORAL_TABLET | Freq: Every day | ORAL | Status: DC
  Administered 2023-03-22 – 2023-03-23 (×2): 5 mg via ORAL
  Filled 2023-03-21 (×2): qty 1

## 2023-03-21 MED ORDER — ACETAMINOPHEN 10 MG/ML IV SOLN
INTRAVENOUS | Status: AC
  Filled 2023-03-21: qty 100

## 2023-03-21 MED ORDER — SODIUM CHLORIDE 0.9% FLUSH
3.0000 mL | Freq: Two times a day (BID) | INTRAVENOUS | Status: DC
  Administered 2023-03-22 – 2023-03-23 (×3): 3 mL via INTRAVENOUS

## 2023-03-21 SURGICAL SUPPLY — 64 items
AGENT HMST KT MTR STRL THRMB (HEMOSTASIS) ×1
BAG COUNTER SPONGE SURGICOUNT (BAG) ×1 IMPLANT
BAG SPNG CNTER NS LX DISP (BAG)
BAG URIMETER BARDEX IC 350 (UROLOGICAL SUPPLIES) IMPLANT
BLADE CLIPPER SURG (BLADE) IMPLANT
CABLE BIPOLOR RESECTION CORD (MISCELLANEOUS) ×1 IMPLANT
CANISTER SUCT 3000ML PPV (MISCELLANEOUS) ×1 IMPLANT
CANNULA ROD THRD COAL GL (CANNULA) IMPLANT
CLSR STERI-STRIP ANTIMIC 1/2X4 (GAUZE/BANDAGES/DRESSINGS) ×1 IMPLANT
CNTNR URN SCR LID CUP LEK RST (MISCELLANEOUS) IMPLANT
CONT SPEC 4OZ STRL OR WHT (MISCELLANEOUS) ×1
COVER MAYO STAND STRL (DRAPES) ×3 IMPLANT
COVER SURGICAL LIGHT HANDLE (MISCELLANEOUS) ×2 IMPLANT
DRAIN CHANNEL 15F RND FF W/TCR (WOUND CARE) IMPLANT
DRAPE C-ARM 42X72 X-RAY (DRAPES) ×1 IMPLANT
DRAPE POUCH INSTRU U-SHP 10X18 (DRAPES) ×1 IMPLANT
DRAPE SURG 17X23 STRL (DRAPES) ×1 IMPLANT
DRAPE U-SHAPE 47X51 STRL (DRAPES) ×1 IMPLANT
DRSG OPSITE POSTOP 3X4 (GAUZE/BANDAGES/DRESSINGS) ×1 IMPLANT
DRSG OPSITE POSTOP 4X6 (GAUZE/BANDAGES/DRESSINGS) IMPLANT
DURAPREP 26ML APPLICATOR (WOUND CARE) ×1 IMPLANT
ELECT COATED BLADE 2.86 ST (ELECTRODE) ×1 IMPLANT
ELECT PENCIL ROCKER SW 15FT (MISCELLANEOUS) ×1 IMPLANT
ELECT REM PT RETURN 9FT ADLT (ELECTROSURGICAL) ×1
ELECTRODE REM PT RTRN 9FT ADLT (ELECTROSURGICAL) ×1 IMPLANT
GLOVE BIO SURGEON STRL SZ 6.5 (GLOVE) ×1 IMPLANT
GLOVE BIOGEL PI IND STRL 6.5 (GLOVE) ×1 IMPLANT
GLOVE BIOGEL PI IND STRL 8.5 (GLOVE) ×1 IMPLANT
GLOVE SS BIOGEL STRL SZ 8.5 (GLOVE) ×1 IMPLANT
GOWN STRL REUS W/ TWL LRG LVL3 (GOWN DISPOSABLE) ×1 IMPLANT
GOWN STRL REUS W/TWL 2XL LVL3 (GOWN DISPOSABLE) ×1 IMPLANT
GOWN STRL REUS W/TWL LRG LVL3 (GOWN DISPOSABLE) ×1
KIT BASIN OR (CUSTOM PROCEDURE TRAY) ×1 IMPLANT
KIT TURNOVER KIT B (KITS) ×1 IMPLANT
NDL SPNL 18GX3.5 QUINCKE PK (NEEDLE) ×1 IMPLANT
NEEDLE HYPO 22GX1.5 SAFETY (NEEDLE) ×1 IMPLANT
NEEDLE SPNL 18GX3.5 QUINCKE PK (NEEDLE) ×1 IMPLANT
NS IRRIG 1000ML POUR BTL (IV SOLUTION) ×1 IMPLANT
PACK ORTHO CERVICAL (CUSTOM PROCEDURE TRAY) ×1 IMPLANT
PACK UNIVERSAL I (CUSTOM PROCEDURE TRAY) ×1 IMPLANT
PAD ARMBOARD 7.5X6 YLW CONV (MISCELLANEOUS) ×2 IMPLANT
PATTIES SURGICAL .25X.25 (GAUZE/BANDAGES/DRESSINGS) IMPLANT
PIN DISTRATION 14MM (PIN) IMPLANT
POSITIONER HEAD DONUT 9IN (MISCELLANEOUS) ×1 IMPLANT
PUTTY BONE DBX 2.5 MIS (Bone Implant) IMPLANT
RESTRAINT LIMB HOLDER UNIV (RESTRAINTS) ×1 IMPLANT
SCREW SD 3.6X16 (Screw) IMPLANT
SPACER ANTC COAL 8X18X15 7D (Spacer) IMPLANT
SPONGE INTESTINAL PEANUT (DISPOSABLE) ×1 IMPLANT
SPONGE SURGIFOAM ABS GEL SZ50 (HEMOSTASIS) ×1 IMPLANT
SURGIFLO W/THROMBIN 8M KIT (HEMOSTASIS) IMPLANT
SUT BONE WAX W31G (SUTURE) ×1 IMPLANT
SUT MNCRL AB 3-0 PS2 27 (SUTURE) ×1 IMPLANT
SUT SILK 2 0 TIES 17X18 (SUTURE) ×1
SUT SILK 2-0 18XBRD TIE BLK (SUTURE) IMPLANT
SUT VIC AB 2-0 CT1 18 (SUTURE) ×1 IMPLANT
SYR BULB IRRIG 60ML STRL (SYRINGE) ×1 IMPLANT
SYR CONTROL 10ML LL (SYRINGE) ×1 IMPLANT
TAPE CLOTH 4X10 WHT NS (GAUZE/BANDAGES/DRESSINGS) ×1 IMPLANT
TAPE UMBILICAL 1/8X30 (MISCELLANEOUS) ×1 IMPLANT
TOWEL GREEN STERILE (TOWEL DISPOSABLE) ×1 IMPLANT
TOWEL GREEN STERILE FF (TOWEL DISPOSABLE) ×1 IMPLANT
WATER STERILE IRR 1000ML POUR (IV SOLUTION) ×1 IMPLANT
YANKAUER SUCT BULB TIP NO VENT (SUCTIONS) IMPLANT

## 2023-03-21 NOTE — Anesthesia Procedure Notes (Signed)
Procedure Name: Intubation Date/Time: 03/21/2023 2:47 PM  Performed by: Loleta Pam Vanalstine, CRNAPre-anesthesia Checklist: Patient identified, Patient being monitored, Timeout performed, Emergency Drugs available and Suction available Patient Re-evaluated:Patient Re-evaluated prior to induction Oxygen Delivery Method: Circle system utilized Preoxygenation: Pre-oxygenation with 100% oxygen Induction Type: IV induction Ventilation: Mask ventilation without difficulty Laryngoscope Size: Mac, Glidescope and 4 Grade View: Grade I Tube type: Oral Tube size: 7.5 mm Number of attempts: 1 Airway Equipment and Method: Stylet Placement Confirmation: ETT inserted through vocal cords under direct vision, positive ETCO2 and breath sounds checked- equal and bilateral Secured at: 23 cm Tube secured with: Tape Dental Injury: Teeth and Oropharynx as per pre-operative assessment

## 2023-03-21 NOTE — H&P (Signed)
History: Steve Hunter is a very pleasant 77 year old gentleman with multilevel cervical degenerative disease and significant neck pain. 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 Preoperative MRI demonstrates cord signal change at C3-4 due to severe stenosis. As a result of his myelopathy we have elected to move forward with an ACDF at C3-4.  Past Medical History:  Diagnosis Date   Anxiety disorder    Aortic stenosis    Mild   Cancer (HCC)    Bladder   Chronic back pain    Diabetes mellitus    Type II   GERD (gastroesophageal reflux disease)    Hypercholesterolemia    Hypertension    Melanoma (HCC)    Osteoarthritis    Sleep apnea    no cpap    Allergies  Allergen Reactions   Lexiscan [Regadenoson] Itching and Rash    Patient developed mosquite bite type rash on chest, back and leg post lexiscan.    No current facility-administered medications on file prior to encounter.   Current Outpatient Medications on File Prior to Encounter  Medication Sig Dispense Refill   aspirin 81 MG tablet Take 81 mg by mouth daily.     atorvastatin (LIPITOR) 40 MG tablet Take 40 mg by mouth daily.     finasteride (PROSCAR) 5 MG tablet Take 5 mg by mouth daily.     hydrochlorothiazide (HYDRODIURIL) 12.5 MG tablet Take 12.5 mg by mouth daily.     linaclotide (LINZESS) 72 MCG capsule Take 72 mcg by mouth daily as needed (constipation).     LORazepam (ATIVAN) 0.5 MG tablet Take 0.5 mg by mouth every 8 (eight) hours as needed for anxiety.     losartan (COZAAR) 100 MG tablet Take 100 mg by mouth daily.     meloxicam (MOBIC) 15 MG tablet Take 15 mg by mouth daily.     metFORMIN (GLUCOPHAGE-XR) 500 MG 24 hr tablet Take 500-1,000 mg by mouth See admin instructions. Take 1000 mg by mouth in the morning and 500 mg in the evening     pantoprazole (PROTONIX) 40 MG tablet Take 40 mg by mouth 2 (two) times daily.     Polyethyl Glycol-Propyl Glycol 0.4-0.3 % SOLN Place 1 drop  into both eyes 3 (three) times daily as needed (dry eyes).     tamsulosin (FLOMAX) 0.4 MG CAPS capsule Take 0.4 mg by mouth daily.     traMADol (ULTRAM) 50 MG tablet Take 50 mg by mouth 3 (three) times daily as needed for moderate pain.     zolpidem (AMBIEN) 10 MG tablet Take 5-10 mg by mouth at bedtime as needed for sleep.     Accu-Chek Softclix Lancets lancets USE AS DIRECTED 2-3 TIMES DAILY     glucose blood (ACCU-CHEK AVIVA PLUS) test strip USE AS DIRECTED 2-3 TIMES DAILY      Physical Exam: Vitals:   03/21/23 1124  BP: (!) 149/79  Pulse: 100  Resp: 18  Temp: 98.2 F (36.8 C)  SpO2: 96%   Body mass index is 24.49 kg/m. Clinical exam: Steve Hunter is a pleasant individual, who appears younger than their stated age.  He is alert and orientated 3.  No shortness of breath, chest pain.  Abdomen is soft and non-tender, no loss in bowel control. Patient does have a history of urinary retention but denies incontinence. no rebound tenderness.  Negative: skin lesions abrasions contusions  Peripheral pulses: 2+ peripheral pulses in the upper extremity.. LE compartments are: Soft and nontender.  Gait pattern: Altered gait pattern. Positive ataxic gait with difficulty with heel toe ambulation. Positive Romberg's test.  Assistive devices: Walking stick  Neuro: Positive hyperreflexia in the upper and lower extremities 3+ brisk. Negative Babinski test, negative Hoffman test. 5/5 motor strength in the upper extremity. 4+/5 bilateral hip flexor and quad strength. No clonus. Intermittent dysesthesias in the upper and lower extremity.  Musculoskeletal: Significant low back pain radiating into the lower extremity. Positive neck pain with crepitus and discomfort with range of motion.  Imaging: X-rays of the cervical spine taken today in the office demonstrate multilevel degenerative cervical disease with anterior exostosis formation C5-6 C6-7. No fractures seen. Significant facet arthrosis at  multiple levels as noted.  Cervical MRI: completed on 02/17/2023. Severe spinal stenosis at C3-4 with positive cord signal changes consistent with myelomalacia. Also significant biforaminal stenosis right side worse than the left. Mild stenosis C4-5 with moderate to severe foraminal stenosis on the right side. Positive degenerative disc disease and facet hypertrophy but no significant stenosis C5-T1. No fractures seen..  A/P:Summary: Steve Hunter is a very pleasant 77 year old gentleman with progressive neck and low back pain, difficulty maintaining his balance, urinary retention and generalized weakness. The patient's clinical exam was concerning for possible cervical myelopathy. He was having altered gait progressive proximal lower extremity weakness and loss in manual dexterity and fine motor control. Cervical MRI which shows cord signal changes with severe stenosis at C3-4 consistent with cervical spondylitic myelopathy. While he does have multilevel degenerative cervical disease the principal area of concern is at C3-4. The goal of surgery is to relieve the pressure on the spinal cord to prevent progression in his functional deficit. I have been very clear with him that the goal is not to improve his low back pain or to improve his overall function it is to prevent worsening of his symptoms. While there is the chance that he can improve I have been very clear that our primary goal with myelopathy is to prevent worsening of the functional deficits. I have gone over the surgical procedure in great detail with the patient and we have discussed the risks, benefits, and alternatives. Risks and benefits of surgery were discussed with the patient. These include: Infection, bleeding, death, stroke, paralysis, ongoing or worse pain, need for additional surgery, nonunion, leak of spinal fluid, adjacent segment degeneration requiring additional fusion surgery. Pseudoarthrosis (nonunion)requiring supplemental posterior  fixation. Throat pain, swallowing difficulties, hoarseness or change in voice. We will plan on a single level ACDF with a 0 profile device to minimize irritation to the esophagus. We will utilize DBX mix with the NuVasive 0 profile intervertebral cage.

## 2023-03-21 NOTE — Transfer of Care (Signed)
Immediate Anesthesia Transfer of Care Note  Patient: Steve Hunter  Procedure(s) Performed: ANTERIOR CERVICAL DECOMPRESSION/DISCECTOMY FUSION 1 LEVEL C3-4  Patient Location: PACU  Anesthesia Type:General  Level of Consciousness: drowsy and patient cooperative  Airway & Oxygen Therapy: Patient Spontanous Breathing  Post-op Assessment: Report given to RN and Post -op Vital signs reviewed and stable  Post vital signs: Reviewed and stable  Last Vitals:  Vitals Value Taken Time  BP 111/69 03/21/23 1738  Temp    Pulse 116 03/21/23 1743  Resp 19 03/21/23 1743  SpO2 92 % 03/21/23 1743  Vitals shown include unvalidated device data.  Last Pain:  Vitals:   03/21/23 1142  PainSc: 0-No pain         Complications: No notable events documented.

## 2023-03-21 NOTE — Brief Op Note (Signed)
03/21/2023  5:33 PM  PATIENT:  Steve Hunter  77 y.o. 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

## 2023-03-21 NOTE — Op Note (Signed)
OPERATIVE REPORT  DATE OF SURGERY: 03/21/2023  PATIENT NAME:  Steve Hunter MRN: [id number] DOB: 1946-07-26  PCP: Barbie Banner, MD  PRE-OPERATIVE DIAGNOSIS: Spondylitic myelopathy C3-4  POST-OPERATIVE DIAGNOSIS: Same  PROCEDURE:   ACDF C3-4  SURGEON:  Venita Lick, MD  PHYSICIAN ASSISTANT: None  ANESTHESIA:   General  EBL: 20 ml   Complications: None  Implants: Globus 0 profile dilution MIS cage with integrated screw fixation.  8 x 18 x 15 (7 degree lordosis) with 16 mm locking screws.  Graft: DBX mix  BRIEF HISTORY: Steve Hunter is a 77 y.o. male who presented to my office with difficulty ambulating, loss of balance, generalized dysesthesias.  Imaging demonstrated multilevel cervical degenerative disc disease but profound central stenosis at C3-4 with cord compression.  As result of his myelopathy we elected to move forward with surgery.  All appropriate risks, benefits, and alternatives were discussed with the patient and consent was obtained.  PROCEDURE DETAILS: Patient was brought into the operating room and was properly positioned on the operating room table.  After induction with general anesthesia the patient was endotracheally intubated.  A timeout was taken to confirm all important data: including patient, procedure, and the level. Teds, SCD's were applied.   The anterior cervical spine was prepped and draped in a standard fashion.  Using fluoroscopy I marked out my C3-4 disc space level.  I marked my left-sided transverse incision site and then infiltrated with quarter percent Marcaine with epinephrine.  Transverse incision was made and sharp dissection was carried out down to and through the platysma.  I then began dissecting along the medial border the sternocleidomastoid into the deep cervical fascia.  The omohyoid was identified as was the esophagus and they were both swept to the right.  Eventually the carotid sheath came into view and was protected with a  finger laterally.  I then placed a hand-held retractor to hold the esophagus to the right and then used Kitner dissectors to remove the remainder of the prevertebral fascia and expose the anterior longitudinal ligament.  Once I had adequately exposed the C3-4 disc space I placed a needle into the disc space and took an x-ray to confirm I was at the appropriate level.  Once this was done I used Bovie to mobilize the longus coli muscle from the mid body of C3 to the mid body of C4.  Retractor blades were placed underneath the longus coli muscle and the endotracheal cuff was deflated.  I expanded the retractors to the appropriate width and then reinflated the endotracheal cuff.  An annulotomy was then performed at C3-4.  I remove the bulk of the disc material with pituitary rongeurs.  I then trimmed down the anterior osteophyte from the leading edge of the C3 vertebral body to better adequately visualize the disc space.  Using curettes I continued remove the bulk of the disc material.  Distraction pins were then placed into the midportion of C3 and C4 vertebral bodies and I distracted the intervertebral space with a lamina spreader.  This distraction was maintained with the traction pin set.  I then continued my dissection posteriorly.  I then used a fine nerve hook to dissect underneath the posterior rim of the vertebral body and a 1 mm Kerrison to resect it.  There was a large bony osteophyte in the mid line.  I was able to dissect away the PLL bilaterally on either side and undercut the uncovertebral joints.  I then carefully began dissecting  attempting to create a plane between the thecal sac and the bone spur.  This proved to be quite difficult as it was very adherent.  I then undercut the posterior aspect of the vertebral body of C3 and C4 to allow for the bone fragment to be displaced anteriorly.  I then was able to debride and remove approximately one half of it.  There still remained towards the right corner  osteophyte.  While it was mobile it was still quite adherent to the thecal sac.  Despite attempts at gentle manipulation and the I could not adequately mobilize it to remove it.  I was concerned that I would cause an inadvertent durotomy.  This point I felt as though with a solid fusion the bone spur would reabsorb and the best option would be to proceed with fusion.  I did feel as though the remainder of the thecal sac and the nerve roots were decompressed.  I then rasped the endplates to remove the cartilaginous material and exposed bleeding subchondral bone.  I then trialed the intervertebral spacers and elected to use the size 8 large lordotic implant.  The implant was obtained and packed with allograft and malleted to the appropriate depth.  Locking screws were then inserted into the vertebral body through this plate.  Both screws had excellent purchase.  I then locked the screws directly to the cage with the locking devices per manufacture standards.  At this point the wound was copiously irrigated with normal saline and the distraction pins were removed.  The bone edges were sealed with bone wax.  Images were taken and they demonstrated satisfactory positioning of the cage.  I then returned the trach and esophagus back to midline and confirmed hemostasis.  I then closed the platysma with interrupted 2-0 Vicryl suture and a 3-0 Monocryl for the skin.  Steri-Strips and dry dressings were applied and the patient was ultimately extubated and transferred to PACU without incident.  The end of the case all needle sponge counts were correct.  Venita Lick, MD 03/21/2023 5:25 PM

## 2023-03-21 NOTE — Discharge Instructions (Signed)

## 2023-03-22 ENCOUNTER — Observation Stay (HOSPITAL_COMMUNITY): Payer: Medicare Other

## 2023-03-22 DIAGNOSIS — M4712 Other spondylosis with myelopathy, cervical region: Secondary | ICD-10-CM | POA: Diagnosis not present

## 2023-03-22 LAB — GLUCOSE, CAPILLARY
Glucose-Capillary: 182 mg/dL — ABNORMAL HIGH (ref 70–99)
Glucose-Capillary: 156 mg/dL — ABNORMAL HIGH (ref 70–99)
Glucose-Capillary: 119 mg/dL — ABNORMAL HIGH (ref 70–99)
Glucose-Capillary: 150 mg/dL — ABNORMAL HIGH (ref 70–99)

## 2023-03-22 LAB — URINE CULTURE: Culture: >=100000 — AB

## 2023-03-22 MED ORDER — LORAZEPAM 0.5 MG PO TABS
0.5000 mg | ORAL_TABLET | Freq: Four times a day (QID) | ORAL | Status: DC | PRN
  Administered 2023-03-22 – 2023-03-23 (×4): 0.5 mg via ORAL
  Filled 2023-03-22 (×4): qty 1

## 2023-03-22 MED ORDER — GLUCERNA SHAKE PO LIQD
237.0000 mL | Freq: Three times a day (TID) | ORAL | Status: DC
  Filled 2023-03-22 (×4): qty 237

## 2023-03-22 MED ORDER — CHLORHEXIDINE GLUCONATE CLOTH 2 % EX PADS
6.0000 | MEDICATED_PAD | Freq: Every day | CUTANEOUS | Status: DC
  Administered 2023-03-22 – 2023-03-23 (×2): 6 via TOPICAL

## 2023-03-22 MED ORDER — LORAZEPAM 0.5 MG PO TABS
0.5000 mg | ORAL_TABLET | Freq: Once | ORAL | Status: AC | PRN
  Administered 2023-03-22: 0.5 mg via ORAL
  Filled 2023-03-22: qty 1

## 2023-03-22 MED ORDER — ALUM & MAG HYDROXIDE-SIMETH 200-200-20 MG/5ML PO SUSP
30.0000 mL | Freq: Four times a day (QID) | ORAL | Status: DC | PRN
  Administered 2023-03-22: 30 mL via ORAL
  Filled 2023-03-22: qty 30

## 2023-03-22 MED ORDER — SODIUM CHLORIDE 0.9 % IV SOLN
2.0000 g | INTRAVENOUS | Status: DC
  Administered 2023-03-22: 2 g via INTRAVENOUS
  Filled 2023-03-22 (×2): qty 12.5

## 2023-03-22 MED ORDER — INSULIN ASPART 100 UNIT/ML IJ SOLN
0.0000 [IU] | Freq: Three times a day (TID) | INTRAMUSCULAR | Status: DC
  Administered 2023-03-22 (×2): 3 [IU] via SUBCUTANEOUS
  Administered 2023-03-23: 2 [IU] via SUBCUTANEOUS

## 2023-03-22 NOTE — Care Management Obs Status (Signed)
MEDICARE OBSERVATION STATUS NOTIFICATION   Patient Details  Name: Steve Hunter MRN: [id number] Date of Birth: [birth year]   Medicare Observation Status Notification Given:  Yes    Kermit Balo, RN 03/22/2023, 10:22 AM

## 2023-03-22 NOTE — Consult Note (Cosign Needed Addendum)
Urology Consult  Referring physician: Dr. Shon Baton Reason for referral: testicular pain  Chief Complaint: scrotal swelling and testicular pain  History of Present Illness:  Steve Hunter is a 77 year old male admitted to [facility] on 03/21/2023 for scheduled anterior cervical decompression from C3-C4.  Postop day 1 patient complained of swelling of the genitalia with left-sided testicular pain.  Patient is known to our practice and is followed by Dr. Annabell Howells. He has been treated for symptoms of BPH, low bladder capacity, and chronic indwelling Foley catheter.  From our notes it appears that a voiding trial has been offered but patient has opted to keep catheter until he was on the other side of multiple spinal surgeries.    On assessment patient was alert, oriented, and resting comfortably in bed.  He was accompanied by his wife.  Foley catheter was draining clear yellow urine.  On physical examination there was swelling of the penis and scrotum.  The left hemiscrotum was somewhat more erythematous than the right, with exquisitely tender swollen and indurated testicle.  Past Medical History:  Diagnosis Date   Anxiety disorder    Aortic stenosis    Mild   Cancer (HCC)    Bladder   Chronic back pain    Diabetes mellitus    Type II   GERD (gastroesophageal reflux disease)    Hypercholesterolemia    Hypertension    Melanoma (HCC)    Osteoarthritis    Sleep apnea    no cpap   Past Surgical History:  Procedure Laterality Date   BACK SURGERY     BLADDER SURGERY     carpal tunnel surgery     CATARACT EXTRACTION W/ INTRAOCULAR LENS IMPLANT Bilateral    5 years ago   CYSTOSCOPY WITH INSERTION OF UROLIFT  07/18/2022   MELANOMA EXCISION     SHOULDER SURGERY Left    TONSILLECTOMY     TOTAL KNEE ARTHROPLASTY     left   WRIST SURGERY     Right wrist    Medications: I have reviewed the patient's current medications.  Allergies:  Allergies  Allergen Reactions   Lexiscan  [Regadenoson] Itching and Rash    Patient developed mosquite bite type rash on chest, back and leg post lexiscan.    Family History  Problem Relation Age of Onset   Hypertension Mother    Lung cancer Father    Social History:  reports that he quit smoking about 42 years ago. His smoking use included cigarettes. He has never used smokeless tobacco. He reports current alcohol use. He reports that he does not use drugs.  ROS: All systems are reviewed and negative except as noted. Genital swelling and left testicular pain  Physical Exam:  Vital signs in last 24 hours: Temp:  [97.5 F (36.4 C)-98 F (36.7 C)] 97.6 F (36.4 C) (05/31 1158) Pulse Rate:  [75-115] 93 (05/31 1158) Resp:  [10-24] 19 (05/31 1158) BP: (110-134)/(65-104) 125/71 (05/31 1158) SpO2:  [91 %-99 %] 99 % (05/31 1158)  Cardiovascular: Skin warm; not flushed Respiratory: Breaths quiet; no shortness of breath Abdomen: No masses, soft, no guarding or rebound Neurological: Normal sensation to touch Musculoskeletal: Normal motor function arms and legs Skin: No rashes Genitourinary: Foley catheter in place draining clear yellow urine.  Swelling of the penis and scrotum.  Left hemiscrotum mildly erythematous, exquisitely tender to palpation, and indurated left testicle.  Laboratory Data:  Results for orders placed or performed during the hospital encounter of 03/21/23 (from the  past 72 hour(s))  Glucose, capillary     Status: Abnormal   Collection Time: 03/21/23 11:27 AM  Result Value Ref Range   Glucose-Capillary 155 (H) 70 - 99 mg/dL    Comment: Glucose reference range applies only to samples taken after fasting for at least 8 hours.   Comment 1 Notify RN    Comment 2 Document in Chart   Glucose, capillary     Status: Abnormal   Collection Time: 03/21/23  1:31 PM  Result Value Ref Range   Glucose-Capillary 141 (H) 70 - 99 mg/dL    Comment: Glucose reference range applies only to samples taken after fasting for at  least 8 hours.  Urine Culture     Status: Abnormal (Preliminary result)   Collection Time: 03/21/23  5:28 PM   Specimen: Urine, Catheterized  Result Value Ref Range   Specimen Description URINE, CATHETERIZED    Special Requests      NONE Performed at [facility], [address] [place], [place] [address]    Culture >=100,000 COLONIES/mL PROTEUS MIRABILIS (A)    Report Status PENDING   Glucose, capillary     Status: Abnormal   Collection Time: 03/21/23  5:42 PM  Result Value Ref Range   Glucose-Capillary 155 (H) 70 - 99 mg/dL    Comment: Glucose reference range applies only to samples taken after fasting for at least 8 hours.  Glucose, capillary     Status: Abnormal   Collection Time: 03/21/23  7:17 PM  Result Value Ref Range   Glucose-Capillary 174 (H) 70 - 99 mg/dL    Comment: Glucose reference range applies only to samples taken after fasting for at least 8 hours.  Urinalysis, Routine w reflex microscopic -Urine, Catheterized     Status: Abnormal   Collection Time: 03/21/23  8:07 PM  Result Value Ref Range   Color, Urine YELLOW YELLOW   APPearance TURBID (A) CLEAR   Specific Gravity, Urine 1.012 1.005 - 1.030   pH 6.0 5.0 - 8.0   Glucose, UA NEGATIVE NEGATIVE mg/dL   Hgb urine dipstick MODERATE (A) NEGATIVE   Bilirubin Urine NEGATIVE NEGATIVE   Ketones, ur NEGATIVE NEGATIVE mg/dL   Protein, ur 604 (A) NEGATIVE mg/dL   Nitrite NEGATIVE NEGATIVE   Leukocytes,Ua LARGE (A) NEGATIVE   RBC / HPF >50 0 - 5 RBC/hpf   WBC, UA >50 0 - 5 WBC/hpf   Bacteria, UA RARE (A) NONE SEEN   Squamous Epithelial / HPF 0-5 0 - 5 /HPF   Non Squamous Epithelial 6-10 (A) NONE SEEN    Comment: Performed at [facility], [address] [place], [place] [address]  Glucose, capillary     Status: Abnormal   Collection Time: 03/21/23 10:08 PM  Result Value Ref Range   Glucose-Capillary 202 (H) 70 - 99 mg/dL    Comment: Glucose reference range applies only to samples taken after fasting  for at least 8 hours.   Comment 1 Notify RN    Comment 2 Document in Chart   Glucose, capillary     Status: Abnormal   Collection Time: 03/22/23  7:00 AM  Result Value Ref Range   Glucose-Capillary 182 (H) 70 - 99 mg/dL    Comment: Glucose reference range applies only to samples taken after fasting for at least 8 hours.   Comment 1 Notify RN    Comment 2 Document in Chart   Glucose, capillary     Status: Abnormal  Collection Time: 03/22/23 12:18 PM  Result Value Ref Range   Glucose-Capillary 156 (H) 70 - 99 mg/dL    Comment: Glucose reference range applies only to samples taken after fasting for at least 8 hours.   Recent Results (from the past 240 hour(s))  Urine Culture     Status: Abnormal (Preliminary result)   Collection Time: 03/21/23  5:28 PM   Specimen: Urine, Catheterized  Result Value Ref Range Status   Specimen Description URINE, CATHETERIZED  Final   Special Requests   Final    NONE Performed at [facility], [address] [place], [place] [address]    Culture >=100,000 COLONIES/mL PROTEUS MIRABILIS (A)  Final   Report Status PENDING  Incomplete   Creatinine: No results for input(s): "CREATININE" in the last 168 hours.  Imaging: See report/chart  Assessment/Plan:  Scrotal US pending  Very likely epididymoorchitis.  Thankfully primary team has already started him on IV cefepime to treat his UTI, acting on previous culture data for pseudomonal infection.  This is likely the same organism and after a day of IV treatment, patient should be able to transition to oral Levaquin for 10 days. Follow up closely in clinic.   Feel free to call with questions.    Scherrie Bateman Jumar Greenstreet 03/22/2023, 2:13 PM  Pager: [phone]

## 2023-03-22 NOTE — Evaluation (Signed)
Occupational Therapy Evaluation Patient Details Name: Steve Hunter MRN: [id number] DOB: [birth year] Today's Date: 03/22/2023   History of Present Illness 77 yo M adm for scheduled ACDF.  PMH includes prior back surgery, DM, HTN and Anxiety.   Clinical Impression   Patient admitted for the surgery above.  PTA he lives at home with his spouse, and despite back pain, remained fairly independent with ADL, iADL and mobility.  Currently his biggest concern is a suspected UTI.  Patient is moving well at RW level, and performed ADL with generalized supervision.  OT can check on him to see if the patient has any follow up questions, review precautions and ADL completion if needed.  No post acute OT is anticipated.         Recommendations for follow up therapy are one component of a multi-disciplinary discharge planning process, led by the attending physician.  Recommendations may be updated based on patient status, additional functional criteria and insurance authorization.   Assistance Recommended at Discharge Set up Supervision/Assistance  Patient can return home with the following Assist for transportation;Assistance with cooking/housework    Functional Status Assessment  Patient has had a recent decline in their functional status and demonstrates the ability to make significant improvements in function in a reasonable and predictable amount of time.  Equipment Recommendations  None recommended by OT    Recommendations for Other Services       Precautions / Restrictions Precautions Precautions: Cervical Precaution Booklet Issued: Yes (comment) Precaution Comments: reviewed and verbalized understanding Restrictions Weight Bearing Restrictions: No      Mobility Bed Mobility Overal bed mobility: Modified Independent                  Transfers Overall transfer level: Needs assistance Equipment used: Rolling walker (2 wheels) Transfers: Bed to chair/wheelchair/BSC        Step pivot transfers: Supervision            Balance Overall balance assessment: Mild deficits observed, not formally tested                                         ADL either performed or assessed with clinical judgement   ADL                                         General ADL Comments: Generalized supervision, but no physical assist     Vision Patient Visual Report: No change from baseline       Perception     Praxis      Pertinent Vitals/Pain Pain Assessment Pain Assessment: Faces Faces Pain Scale: Hurts little more Pain Location: Incision site Pain Descriptors / Indicators: Tender Pain Intervention(s): Monitored during session     Hand Dominance Right   Extremity/Trunk Assessment Upper Extremity Assessment Upper Extremity Assessment: Overall WFL for tasks assessed   Lower Extremity Assessment Lower Extremity Assessment: Overall WFL for tasks assessed   Cervical / Trunk Assessment Cervical / Trunk Assessment: Neck Surgery   Communication Communication Communication: No difficulties   Cognition Arousal/Alertness: Awake/alert Behavior During Therapy: WFL for tasks assessed/performed Overall Cognitive Status: Within Functional Limits for tasks assessed  Home Living Family/patient expects to be discharged to:: Private residence Living Arrangements: Spouse/significant other Available Help at Discharge: Family;Available 24 hours/day Type of Home: House Home Access: Stairs to enter [employer] of Steps: 4 Entrance Stairs-Rails: Right;Left;Can reach both Home Layout: One level     Bathroom Shower/Tub: Tub/shower unit;Walk-in shower   Bathroom Toilet: Standard Bathroom Accessibility: Yes How Accessible: Accessible via walker Home Equipment: Rolling Walker (2 wheels);Shower seat          Prior Functioning/Environment Prior  Level of Function : Independent/Modified Independent;Driving                        OT Problem List: Pain      OT Treatment/Interventions: Self-care/ADL training;Therapeutic activities;Patient/family education    OT Goals(Current goals can be found in the care plan section) Acute Rehab OT Goals Patient Stated Goal: Return home OT Goal Formulation: With patient Time For Goal Achievement: 04/05/23 Potential to Achieve Goals: Good  OT Frequency: Min 2X/week    Co-evaluation              AM-PAC OT "6 Clicks" Daily Activity     Outcome Measure Help from another person eating meals?: None Help from another person taking care of personal grooming?: None Help from another person toileting, which includes using toliet, bedpan, or urinal?: A Little Help from another person bathing (including washing, rinsing, drying)?: A Little Help from another person to put on and taking off regular upper body clothing?: A Little Help from another person to put on and taking off regular lower body clothing?: A Little 6 Click Score: 20   End of Session Equipment Utilized During Treatment: Rolling walker (2 wheels) Nurse Communication: Mobility status  Activity Tolerance: Patient tolerated treatment well Patient left: in bed;with call bell/phone within reach;with family/visitor present  OT Visit Diagnosis: Unsteadiness on feet (R26.81)                Time: 8295-6213 OT Time Calculation (min): 28 min Charges:  OT General Charges $OT Visit: 1 Visit OT Evaluation $OT Eval Moderate Complexity: 1 Mod OT Treatments $Self Care/Home Management : 8-22 mins  03/22/2023  RP, OTR/L  Acute Rehabilitation Services  Office:  [phone]   Suzanna Obey 03/22/2023, 8:48 AM

## 2023-03-22 NOTE — Progress Notes (Signed)
PT Cancellation Note and Discharge  Patient Details Name: Steve Hunter MRN: [id number] DOB: 1946-08-27   Cancelled Treatment:    Reason Eval/Treat Not Completed: PT screened, no needs identified, will sign off. Discussed pt case with OT who reports pt is currently mobilizing without assistance and does not require a formal PT evaluation at this time. PT signing off. If needs change, please reconsult.     Marylynn Pearson 03/22/2023, 11:08 AM  Conni Slipper, PT, DPT Acute Rehabilitation Services Secure Chat Preferred Office: [phone]

## 2023-03-22 NOTE — Progress Notes (Addendum)
Pharmacy Antibiotic Note  Steve Hunter is a 77 y.o. male admitted on 03/21/2023 with anterior cervical decompression. Pharmacy has been consulted for cefepime dosing for UTI. Hx PsA in Ucx Jan 2024.  Plan: Cefepime 2g IV q24h for UTI w/o sepsis (CrCl 30-50 mL/min) F/u urine culture susceptibilities  Height: 6' (182.9 cm) Weight: 81.9 kg (180 lb 8.9 oz) IBW/kg (Calculated) : 77.6  Temp (24hrs), Avg:97.8 F (36.6 C), Min:97.5 F (36.4 C), Max:98 F (36.7 C)  No results for input(s): "WBC", "CREATININE", "LATICACIDVEN", "VANCOTROUGH", "VANCOPEAK", "VANCORANDOM", "GENTTROUGH", "GENTPEAK", "GENTRANDOM", "TOBRATROUGH", "TOBRAPEAK", "TOBRARND", "AMIKACINPEAK", "AMIKACINTROU", "AMIKACIN" in the last 168 hours.  Estimated Creatinine Clearance: 44.7 mL/min (A) (by C-G formula based on SCr of 1.52 mg/dL (H)).    Allergies  Allergen Reactions   Lexiscan [Regadenoson] Itching and Rash    Patient developed mosquite bite type rash on chest, back and leg post lexiscan.    Antimicrobials this admission: Cefazolin 5/30 >> 5/31 Cefepime 5/31 >>   Microbiology results: 5/30 UCx: >100 K GNR   Thank you for allowing pharmacy to be a part of this patient's care.  Rexford Maus, PharmD, BCPS 03/22/2023 12:54 PM

## 2023-03-22 NOTE — Care Management CC44 (Signed)
Condition Code 44 Documentation Completed  Patient Details  Name: DONNY HUTLEY MRN: [id number] Date of Birth: [birth year]   Condition Code 44 given:  Yes Patient signature on Condition Code 44 notice:  Yes Documentation of 2 MD's agreement:  Yes Code 44 added to claim:  Yes    Kermit Balo, RN 03/22/2023, 10:22 AM

## 2023-03-22 NOTE — Progress Notes (Signed)
    Subjective: Procedure(s) (LRB): ANTERIOR CERVICAL DECOMPRESSION/DISCECTOMY FUSION 1 LEVEL C3-4 (N/A) 1 Day Post-Op  Patient reports pain as 3 on 0-10 scale.  Reports decreased arm pain reports incisional neck pain   Positive void - foley in place Negative bowel movement Positive flatus Negative chest pain or shortness of breath  Objective: Vital signs in last 24 hours: Temp:  [97.5 F (36.4 C)-98 F (36.7 C)] 97.6 F (36.4 C) (05/31 1158) Pulse Rate:  [75-115] 93 (05/31 1158) Resp:  [10-24] 19 (05/31 1158) BP: (110-134)/(65-104) 125/71 (05/31 1158) SpO2:  [91 %-99 %] 99 % (05/31 1158)  Intake/Output from previous day: 05/30 0701 - 05/31 0700 In: 2580 [P.O.:480; I.V.:1400; IV Piggyback:700] Out: 470 [Urine:450; Blood:20]  Labs: No results for input(s): "WBC", "RBC", "HCT", "PLT" in the last 72 hours. No results for input(s): "NA", "K", "CL", "CO2", "BUN", "CREATININE", "GLUCOSE", "CALCIUM" in the last 72 hours. No results for input(s): "LABPT", "INR" in the last 72 hours.  Physical Exam: Neurologically intact ABD soft Dorsiflexion/Plantar flexion intact Incision: dressing C/D/I and no drainage Compartment soft Body mass index is 24.49 kg/m.  Assessment/Plan: Patient stable  Mobilization with physical therapy Encourage incentive spirometry Continue care  1.  Patient expressed anxiousness.  Will continue his Ativan. 2.  Patient has a positive UTI.  Will start an oral antibiotic regimen. 3.  Primary complaints of testicular pain.  Unclear etiology.  He does see Dr. [facility] and is requested to see a urologist.  We will place a consult. 4.  Continue ambulation.  Plan on possible discharge tomorrow.  Venita Lick, MD [facility] [phone]

## 2023-03-22 NOTE — Anesthesia Postprocedure Evaluation (Signed)
Anesthesia Post Note  Patient: Steve Hunter  Procedure(s) Performed: ANTERIOR CERVICAL DECOMPRESSION/DISCECTOMY FUSION 1 LEVEL C3-4     Patient location during evaluation: PACU Anesthesia Type: General Level of consciousness: awake Pain management: pain level controlled Vital Signs Assessment: post-procedure vital signs reviewed and stable Respiratory status: spontaneous breathing, nonlabored ventilation and respiratory function stable Cardiovascular status: blood pressure returned to baseline and stable Postop Assessment: no apparent nausea or vomiting Anesthetic complications: no   No notable events documented.  Last Vitals:  Vitals:   03/22/23 0018 03/22/23 0311  BP: 120/69 110/69  Pulse: 75 82  Resp: 18 18  Temp: 36.4 C (!) 36.4 C  SpO2: 95% 96%    Last Pain:  Vitals:   03/22/23 0311  TempSrc: Oral  PainSc:                  Catheryn Bacon Celica Kotowski

## 2023-03-23 ENCOUNTER — Encounter (HOSPITAL_COMMUNITY): Payer: Self-pay | Admitting: Orthopedic Surgery

## 2023-03-23 DIAGNOSIS — M4712 Other spondylosis with myelopathy, cervical region: Secondary | ICD-10-CM | POA: Diagnosis not present

## 2023-03-23 LAB — URINE CULTURE

## 2023-03-23 LAB — GLUCOSE, CAPILLARY: Glucose-Capillary: 128 mg/dL — ABNORMAL HIGH (ref 70–99)

## 2023-03-23 NOTE — Progress Notes (Signed)
Patient alert and oriented, mae's well, voiding adequate amount of urine, swallowing without difficulty, no c/o pain at time of discharge. Patient discharged home with family. Script and discharged instructions given to patient. Patient and wife stated understanding of instructions given. Patient has an appointment with Dr. Shon Baton.

## 2023-03-23 NOTE — Progress Notes (Signed)
Patient ID: Steve Hunter, male   DOB: [birth year], 77 y.o.   MRN: [id number]  2 Days Post-Op Subjective: Pt feeling somewhat better with less testicular pain.  Objective: Vital signs in last 24 hours: Temp:  [97.6 F (36.4 C)-98.5 F (36.9 C)] 98.1 F (36.7 C) (06/01 0726) Pulse Rate:  [91-102] 92 (06/01 0726) Resp:  [16-20] 18 (06/01 0726) BP: (114-149)/(69-95) 138/75 (06/01 0726) SpO2:  [96 %-99 %] 96 % (06/01 0726)  Intake/Output from previous day: 05/31 0701 - 06/01 0700 In: 1440 [P.O.:1440] Out: 1025 [Urine:1025] Intake/Output this shift: Total I/O In: -  Out: 750 [Urine:750]  Physical Exam:  General: Alert and oriented GU: Scrotum not erythematous, left testis is firm and indurated but not very tender.  Lab Results: No results for input(s): "HGB", "HCT" in the last 72 hours. BMET No results for input(s): "NA", "K", "CL", "CO2", "GLUCOSE", "BUN", "CREATININE", "CALCIUM" in the last 72 hours.   Studies/Results: US SCROTUM W/DOPPLER  Result Date: 03/22/2023 CLINICAL DATA:  Left scrotal pain EXAM: SCROTAL ULTRASOUND DOPPLER ULTRASOUND OF THE TESTICLES TECHNIQUE: Complete ultrasound examination of the testicles, epididymis, and other scrotal structures was performed. Color and spectral Doppler ultrasound were also utilized to evaluate blood flow to the testicles. COMPARISON:  None Available. FINDINGS: Right testicle Measurements: 5.3 x 3.8 x 3.3 cm. No mass or microlithiasis visualized. Left testicle Measurements: 3.7 x 3.1 x 4.6 cm. No mass or microlithiasis visualized. There is increased vascularity. Right epididymis:  There is 5 x 2 x 4 mm right epididymal cyst. Left epididymis: Knees 6 x 5 x 4 mm cyst. There is increased vascularity. Hydrocele:  Moderate left hydrocele with thin internal septations. Varicocele:  None visualized. Pulsed Doppler interrogation of both testes demonstrates normal low resistance arterial and venous waveforms bilaterally. IMPRESSION: There is no  evidence of testicular torsion. There is increased vascularity in left epididymis and left testis suggesting possible acute left epididymo-orchitis. Moderate left hydrocele. There are thin internal septations in the left hydrocele. Clinical observation and short-term follow-up sonogram as warranted should be considered to evaluate for development of pyocele. Small cysts are seen in epididymis on both sides. Electronically Signed   By: Steve Hunter M.D.   On: 03/22/2023 16:35   DG Cervical Spine Complete  Result Date: 03/21/2023 CLINICAL DATA:  Cervical decompression and fusion EXAM: CERVICAL SPINE - COMPLETE 4+ VIEW COMPARISON:  02/17/2023 FLUOROSCOPY TIME:  Radiation Exposure Index (as provided by the fluoroscopic device): 4.01 mGy If the device does not provide the exposure index: Fluoroscopy Time:  48 seconds Number of Acquired Images:  4 FINDINGS: Interbody fusion with screw fixation is noted at C3-4. IMPRESSION: C3-4 fusion. Electronically Signed   By: Steve Hunter M.D.   On: 03/21/2023 19:55   DG C-Arm 1-60 Min-No Report  Result Date: 03/21/2023 Fluoroscopy was utilized by the requesting physician.  No radiographic interpretation.   DG C-Arm 1-60 Min-No Report  Result Date: 03/21/2023 Fluoroscopy was utilized by the requesting physician.  No radiographic interpretation.   DG C-Arm 1-60 Min-No Report  Result Date: 03/21/2023 Fluoroscopy was utilized by the requesting physician.  No radiographic interpretation.    Assessment/Plan: Left epididymo-orchitis: Cultures are sensitive to ciprofloxacin and TMP/SMX.  Either would be a good choice.  Ok for discharge from a urologic standpoint and would send him home on ciprofloxacin 500 mg po bid for a total of 10 days.  Will arrange outpatient follow up.   LOS: 1 day   Steve Hunter 03/23/2023, 9:53 AM

## 2023-03-23 NOTE — Progress Notes (Signed)
    Subjective: Procedure(s) (LRB): ANTERIOR CERVICAL DECOMPRESSION/DISCECTOMY FUSION 1 LEVEL C3-4 (N/A) 2 Days Post-Op  Patient reports pain as 2/10.  Reports decreased arm pain reports incisional neck pain   Eating, drinking well. No speech/swallow issues. Negative bowel movement Positive flatus Negative chest pain or shortness of breath  Objective: Vital signs in last 24 hours: Temp:  [97.6 F (36.4 C)-98.5 F (36.9 C)] 98.1 F (36.7 C) (06/01 0726) Pulse Rate:  [91-102] 92 (06/01 0726) Resp:  [16-20] 18 (06/01 0726) BP: (114-149)/(69-95) 138/75 (06/01 0726) SpO2:  [96 %-99 %] 96 % (06/01 0726)  Intake/Output from previous day: 05/31 0701 - 06/01 0700 In: 960 [P.O.:960] Out: 1025 [Urine:1025]  Labs: No results for input(s): "WBC", "RBC", "HCT", "PLT" in the last 72 hours. No results for input(s): "NA", "K", "CL", "CO2", "BUN", "CREATININE", "GLUCOSE", "CALCIUM" in the last 72 hours. No results for input(s): "LABPT", "INR" in the last 72 hours.  Physical Exam: Neurologically intact ABD soft Dorsiflexion/Plantar flexion intact Incision: dressing C/D/I and no drainage Compartment soft BUE BLE gross motor sens intact baseline Body mass index is 24.49 kg/m.  Assessment/Plan: Patient stable  Mobilization with physical therapy Encourage incentive spirometry Continue care  1.  Patient expressed anxiousness.  Will continue his Ativan. 2.  Patient has a positive UTI. On oral antibiotic regimen. 3.  Primary complaints of testicular pain.  Unclear etiology.  He does see Dr. [facility] and is requested to see a urologist.  On Levaquin per Urology. 4.  Continue ambulation.  Plan on discharge today.  Netta Cedars, MD Orthopaedic Surgery [facility]

## 2023-03-26 NOTE — Discharge Summary (Signed)
Patient ID: BILLY FORCE MRN: [id number] DOB/AGE: Apr 12, 1946 77 y.o.  Admit date: 03/21/2023 Discharge date: 03/26/2023  Admission Diagnoses:  Principal Problem:   Myelopathy [facility])   Discharge Diagnoses:  Principal Problem:   Myelopathy (HCC)  status post Procedure(s): ANTERIOR CERVICAL DECOMPRESSION/DISCECTOMY FUSION 1 LEVEL C3-4  Past Medical History:  Diagnosis Date   Anxiety disorder    Aortic stenosis    Mild   Cancer (HCC)    Bladder   Chronic back pain    Diabetes mellitus    Type II   GERD (gastroesophageal reflux disease)    Hypercholesterolemia    Hypertension    Melanoma (HCC)    Osteoarthritis    Sleep apnea    no cpap    Surgeries: Procedure(s): ANTERIOR CERVICAL DECOMPRESSION/DISCECTOMY FUSION 1 LEVEL C3-4 on 03/21/2023   Consultants: Urology  Discharged Condition: Improved  Hospital Course: STAVROS BARNHARDT is an 77 y.o. male who was admitted 03/21/2023 for operative treatment of Myelopathy (HCC). Patient failed conservative treatments (please see the history and physical for the specifics) and had severe unremitting pain that affects sleep, daily activities and work/hobbies. After pre-op clearance, the patient was taken to the operating room on 03/21/2023 and underwent  Procedure(s): ANTERIOR CERVICAL DECOMPRESSION/DISCECTOMY FUSION 1 LEVEL C3-4.    Patient was given perioperative antibiotics:  Anti-infectives (From admission, onward)    Start     Dose/Rate Route Frequency Ordered Stop   03/22/23 1345  ceFEPIme (MAXIPIME) 2 g in sodium chloride 0.9 % 100 mL IVPB  Status:  Discontinued        2 g 200 mL/hr over 30 Minutes Intravenous Every 24 hours 03/22/23 1253 03/23/23 1707   03/21/23 2200  ceFAZolin (ANCEF) IVPB 1 g/50 mL premix        1 g 100 mL/hr over 30 Minutes Intravenous Every 8 hours 03/21/23 2006 03/22/23 0941   03/21/23 1245  ceFAZolin (ANCEF) IVPB 2g/100 mL premix        2 g 200 mL/hr over 30 Minutes Intravenous 30 min pre-op  03/21/23 1119 03/21/23 1520        Patient was given sequential compression devices and early ambulation to prevent DVT.   Patient benefited maximally from hospital stay and there were no complications. At the time of discharge, the patient was urinating/moving their bowels without difficulty, tolerating a regular diet, pain is controlled with oral pain medications and they have been cleared by PT/OT.   Recent vital signs: No data found.   Recent laboratory studies: No results for input(s): "WBC", "HGB", "HCT", "PLT", "NA", "K", "CL", "CO2", "BUN", "CREATININE", "GLUCOSE", "INR", "CALCIUM" in the last 72 hours.  Invalid input(s): "PT", "2"   Discharge Medications:   Allergies as of 03/23/2023       Reactions   Lexiscan [regadenoson] Itching, Rash   Patient developed mosquite bite type rash on chest, back and leg post lexiscan.        Medication List     STOP taking these medications    aspirin 81 MG tablet   meloxicam 15 MG tablet Commonly known as: MOBIC   traMADol 50 MG tablet Commonly known as: ULTRAM       TAKE these medications    Accu-Chek Aviva Plus test strip Generic drug: glucose blood USE AS DIRECTED 2-3 TIMES DAILY   Accu-Chek Softclix Lancets lancets USE AS DIRECTED 2-3 TIMES DAILY   atorvastatin 40 MG tablet Commonly known as: LIPITOR Take 40 mg by mouth daily.   finasteride 5  MG tablet Commonly known as: PROSCAR Take 5 mg by mouth daily.   hydrochlorothiazide 12.5 MG tablet Commonly known as: HYDRODIURIL Take 12.5 mg by mouth daily.   linaclotide 72 MCG capsule Commonly known as: LINZESS Take 72 mcg by mouth daily as needed (constipation).   LORazepam 0.5 MG tablet Commonly known as: ATIVAN Take 0.5 mg by mouth every 8 (eight) hours as needed for anxiety.   losartan 100 MG tablet Commonly known as: COZAAR Take 100 mg by mouth daily.   metFORMIN 500 MG 24 hr tablet Commonly known as: GLUCOPHAGE-XR Take 500-1,000 mg by mouth See  admin instructions. Take 1000 mg by mouth in the morning and 500 mg in the evening   methocarbamol 500 MG tablet Commonly known as: ROBAXIN Take 1 tablet (500 mg total) by mouth every 8 (eight) hours as needed for up to 5 days for muscle spasms.   ondansetron 4 MG tablet Commonly known as: Zofran Take 1 tablet (4 mg total) by mouth every 8 (eight) hours as needed for nausea or vomiting.   oxyCODONE-acetaminophen 10-325 MG tablet Commonly known as: Percocet Take 1 tablet by mouth every 6 (six) hours as needed for up to 5 days for pain.   pantoprazole 40 MG tablet Commonly known as: PROTONIX Take 40 mg by mouth 2 (two) times daily.   Polyethyl Glycol-Propyl Glycol 0.4-0.3 % Soln Place 1 drop into both eyes 3 (three) times daily as needed (dry eyes).   tamsulosin 0.4 MG Caps capsule Commonly known as: FLOMAX Take 0.4 mg by mouth daily.   zolpidem 10 MG tablet Commonly known as: AMBIEN Take 5-10 mg by mouth at bedtime as needed for sleep.        Diagnostic Studies: US SCROTUM W/DOPPLER  Result Date: 03/22/2023 CLINICAL DATA:  Left scrotal pain EXAM: SCROTAL ULTRASOUND DOPPLER ULTRASOUND OF THE TESTICLES TECHNIQUE: Complete ultrasound examination of the testicles, epididymis, and other scrotal structures was performed. Color and spectral Doppler ultrasound were also utilized to evaluate blood flow to the testicles. COMPARISON:  None Available. FINDINGS: Right testicle Measurements: 5.3 x 3.8 x 3.3 cm. No mass or microlithiasis visualized. Left testicle Measurements: 3.7 x 3.1 x 4.6 cm. No mass or microlithiasis visualized. There is increased vascularity. Right epididymis:  There is 5 x 2 x 4 mm right epididymal cyst. Left epididymis: Knees 6 x 5 x 4 mm cyst. There is increased vascularity. Hydrocele:  Moderate left hydrocele with thin internal septations. Varicocele:  None visualized. Pulsed Doppler interrogation of both testes demonstrates normal low resistance arterial and venous  waveforms bilaterally. IMPRESSION: There is no evidence of testicular torsion. There is increased vascularity in left epididymis and left testis suggesting possible acute left epididymo-orchitis. Moderate left hydrocele. There are thin internal septations in the left hydrocele. Clinical observation and short-term follow-up sonogram as warranted should be considered to evaluate for development of pyocele. Small cysts are seen in epididymis on both sides. Electronically Signed   By: Ernie Avena M.D.   On: 03/22/2023 16:35   DG Cervical Spine Complete  Result Date: 03/21/2023 CLINICAL DATA:  Cervical decompression and fusion EXAM: CERVICAL SPINE - COMPLETE 4+ VIEW COMPARISON:  02/17/2023 FLUOROSCOPY TIME:  Radiation Exposure Index (as provided by the fluoroscopic device): 4.01 mGy If the device does not provide the exposure index: Fluoroscopy Time:  48 seconds Number of Acquired Images:  4 FINDINGS: Interbody fusion with screw fixation is noted at C3-4. IMPRESSION: C3-4 fusion. Electronically Signed   By: Eulah Pont.D.  On: 03/21/2023 19:55   DG C-Arm 1-60 Min-No Report  Result Date: 03/21/2023 Fluoroscopy was utilized by the requesting physician.  No radiographic interpretation.   DG C-Arm 1-60 Min-No Report  Result Date: 03/21/2023 Fluoroscopy was utilized by the requesting physician.  No radiographic interpretation.   DG C-Arm 1-60 Min-No Report  Result Date: 03/21/2023 Fluoroscopy was utilized by the requesting physician.  No radiographic interpretation.    Discharge Instructions     Discharge patient   Complete by: As directed    After confirming w urology as discussed   Discharge disposition: 01-Home or Self Care   Discharge patient date: 03/23/2023   Incentive spirometry RT   Complete by: As directed         Follow-up Information     Venita Lick, MD. Schedule an appointment as soon as possible for a visit in 2 week(s).   Specialty: Orthopedic Surgery Why: If  symptoms worsen, For suture removal, For wound re-check Contact information: [address] [place] [address] [phone]         [facility]. Call.   Why: As needed, If symptoms worsen Contact information: [address] [place] [address] [phone]                Discharge Plan:  discharge to home  Disposition: Najja is a very pleasant 77 year old gentleman who had cervical spondylitic myelopathy.  He underwent a single level ACDF.  Postoperatively he was complaining of testicular pain and so a urology consult was requested.  Recommendation was antibiotics which she had already started for his UTI.  The patient was given a follow-up in the urology clinic.  Patient will be discharged on Levaquin.  Surgical site was intact.  He was ambulating well tolerating a regular diet.  Patient will be discharged with appropriate instructions and medications.  He will follow-up with me in 2 weeks.    Signed: Alvy Beal for Dr. Venita Lick [facility] [phone] 03/26/2023, 7:55 AM

## 2023-04-11 ENCOUNTER — Other Ambulatory Visit: Payer: Self-pay | Admitting: Family Medicine

## 2023-04-11 DIAGNOSIS — R634 Abnormal weight loss: Secondary | ICD-10-CM

## 2023-04-18 ENCOUNTER — Ambulatory Visit
Admission: RE | Admit: 2023-04-18 | Discharge: 2023-04-18 | Disposition: A | Discharge status: Home / Self Care | Payer: Medicare Other | Source: Ambulatory Visit | Attending: Family Medicine | Admitting: Family Medicine

## 2023-04-18 DIAGNOSIS — R634 Abnormal weight loss: Secondary | ICD-10-CM

## 2023-05-02 ENCOUNTER — Other Ambulatory Visit: Payer: Self-pay | Admitting: Family Medicine

## 2023-05-02 DIAGNOSIS — N2889 Other specified disorders of kidney and ureter: Secondary | ICD-10-CM

## 2023-05-02 DIAGNOSIS — E041 Nontoxic single thyroid nodule: Secondary | ICD-10-CM

## 2023-05-07 ENCOUNTER — Ambulatory Visit
Admission: RE | Admit: 2023-05-07 | Discharge: 2023-05-07 | Disposition: A | Discharge status: Home / Self Care | Payer: Medicare Other | Source: Ambulatory Visit | Attending: Family Medicine | Admitting: Family Medicine

## 2023-05-07 ENCOUNTER — Ambulatory Visit: Admission: RE | Admit: 2023-05-07 | Payer: Medicare Other | Source: Ambulatory Visit

## 2023-05-07 DIAGNOSIS — N2889 Other specified disorders of kidney and ureter: Secondary | ICD-10-CM

## 2023-05-07 DIAGNOSIS — E041 Nontoxic single thyroid nodule: Secondary | ICD-10-CM

## 2023-06-06 ENCOUNTER — Other Ambulatory Visit: Payer: Self-pay | Admitting: Family Medicine

## 2023-06-06 DIAGNOSIS — E041 Nontoxic single thyroid nodule: Secondary | ICD-10-CM

## 2023-07-29 ENCOUNTER — Ambulatory Visit
Admission: RE | Admit: 2023-07-29 | Discharge: 2023-07-29 | Disposition: A | Discharge status: Home / Self Care | Payer: Medicare Other | Source: Ambulatory Visit | Attending: Family Medicine | Admitting: Family Medicine

## 2023-07-29 ENCOUNTER — Other Ambulatory Visit (HOSPITAL_COMMUNITY)
Admission: RE | Admit: 2023-07-29 | Discharge: 2023-07-29 | Disposition: A | Discharge status: Home / Self Care | Payer: Medicare Other | Source: Ambulatory Visit | Attending: Family Medicine | Admitting: Family Medicine

## 2023-07-29 DIAGNOSIS — E041 Nontoxic single thyroid nodule: Secondary | ICD-10-CM | POA: Diagnosis present

## 2023-07-31 LAB — CYTOLOGY - NON PAP

## 2023-08-08 ENCOUNTER — Other Ambulatory Visit (HOSPITAL_COMMUNITY): Payer: Self-pay | Admitting: Urology

## 2023-08-08 DIAGNOSIS — R339 Retention of urine, unspecified: Secondary | ICD-10-CM

## 2023-09-05 ENCOUNTER — Ambulatory Visit (HOSPITAL_COMMUNITY): Payer: Medicare Other

## 2023-09-12 ENCOUNTER — Other Ambulatory Visit: Payer: Self-pay | Admitting: Radiology

## 2023-09-12 ENCOUNTER — Encounter (HOSPITAL_COMMUNITY): Payer: Self-pay | Admitting: Radiology

## 2023-09-12 NOTE — H&P (Addendum)
Addendum:  RN called stating that the patient has diarrhea and has had it X 1 day.  Charge RN immediately called back. Patient states that he no longer wants to wait and will be leaving.  Chief Complaint: Urinary rentention with penile pain thought to be related to foley catheter. Request is for suprapubic catheter placement  Referring Physician(s): Wrenn,John  Supervising Physician: Malachy Moan  Patient Status: [facility]  History of Present Illness: Steve Hunter is a 77 y.o. male 77 y.o. male inpatient. History of melanoma, HTN, HLD, GERD, DM, chronic back pain s/p 2- C3 fusion, Left epididymo-orchitis  and urinary retention. Per request "he would like to be converted to a suprapubic catheter." Patient states the request is due to penile pain. Patient presents for suprapubic  Patient alert and laying in bed,calm. Endorses penile pain that he states is baseline Denies any fevers, headache, chest pain, SOB, cough, abdominal pain, nausea, vomiting or bleeding.   Labs pending. All medications are within acceptable parameters. NKDA. Patient has been NPO since midnight.   Return precautions and treatment recommendations and follow-up discussed with the patient who is agreeable with the plan.    Past Medical History:  Diagnosis Date   Anxiety disorder    Aortic stenosis    Mild   Cancer (HCC)    Bladder   Chronic back pain    Diabetes mellitus    Type II   GERD (gastroesophageal reflux disease)    Hypercholesterolemia    Hypertension    Melanoma (HCC)    Osteoarthritis    Sleep apnea    no cpap    Past Surgical History:  Procedure Laterality Date   ANTERIOR CERVICAL DECOMP/DISCECTOMY FUSION N/A 03/21/2023   Procedure: ANTERIOR CERVICAL DECOMPRESSION/DISCECTOMY FUSION 1 LEVEL C3-4;  Surgeon: Venita Lick, MD;  Location: [facility] OR;  Service: Orthopedics;  Laterality: N/A;   BACK SURGERY     BLADDER SURGERY     carpal tunnel surgery     CATARACT EXTRACTION  W/ INTRAOCULAR LENS IMPLANT Bilateral    5 years ago   CYSTOSCOPY WITH INSERTION OF UROLIFT  07/18/2022   MELANOMA EXCISION     SHOULDER SURGERY Left    TONSILLECTOMY     TOTAL KNEE ARTHROPLASTY     left   WRIST SURGERY     Right wrist    Allergies: Lexiscan [regadenoson]  Medications: Prior to Admission medications   Medication Sig Start Date End Date Taking? Authorizing Provider  Accu-Chek Softclix Lancets lancets USE AS DIRECTED 2-3 TIMES DAILY 05/25/19   [provider]  atorvastatin (LIPITOR) 40 MG tablet Take 40 mg by mouth daily. 04/15/19   [provider]  finasteride (PROSCAR) 5 MG tablet Take 5 mg by mouth daily. 07/17/19   [provider]  glucose blood (ACCU-CHEK AVIVA PLUS) test strip USE AS DIRECTED 2-3 TIMES DAILY 05/26/19   [provider]  hydrochlorothiazide (HYDRODIURIL) 12.5 MG tablet Take 12.5 mg by mouth daily. 11/08/19   [provider]  linaclotide (LINZESS) 72 MCG capsule Take 72 mcg by mouth daily as needed (constipation). 11/29/22   [provider]  LORazepam (ATIVAN) 0.5 MG tablet Take 0.5 mg by mouth every 8 (eight) hours as needed for anxiety.    [provider]  losartan (COZAAR) 100 MG tablet Take 100 mg by mouth daily. 04/15/19   [provider]  metFORMIN (GLUCOPHAGE-XR) 500 MG 24 hr tablet Take 500-1,000 mg by mouth See admin instructions. Take 1000 mg by  mouth in the morning and 500 mg in the evening 09/30/19   [provider]  ondansetron (ZOFRAN) 4 MG tablet Take 1 tablet (4 mg total) by mouth every 8 (eight) hours as needed for nausea or vomiting. 03/21/23   Venita Lick, MD  pantoprazole (PROTONIX) 40 MG tablet Take 40 mg by mouth 2 (two) times daily. 04/15/19   [provider]  Polyethyl Glycol-Propyl Glycol 0.4-0.3 % SOLN Place 1 drop into both eyes 3 (three) times daily as needed (dry eyes).    [provider]  tamsulosin (FLOMAX) 0.4 MG CAPS capsule Take  0.4 mg by mouth daily. 08/20/19   [provider]  zolpidem (AMBIEN) 10 MG tablet Take 5-10 mg by mouth at bedtime as needed for sleep.    [provider]     Family History  Problem Relation Age of Onset   Hypertension Mother    Lung cancer Father     Social History   Socioeconomic History   Marital status: Married    Spouse name: Not on file   Number of children: 2   Years of education: Not on file   Highest education level: Not on file  Occupational History   Occupation: retired Scientist, water quality: RETIRED  Tobacco Use   Smoking status: Former    Current packs/day: 0.00    Types: Cigarettes    Quit date: 07/02/1980    Years since quitting: 43.2   Smokeless tobacco: Never  Vaping Use   Vaping status: Never Used  Substance and Sexual Activity   Alcohol use: Yes   Drug use: Never   Sexual activity: Not on file  Other Topics Concern   Not on file  Social History Narrative   Right handed   Wears prescription glasses    Drinks coffee daily    Social Determinants of Health   Financial Resource Strain: Low Risk  (11/06/2022)   [facility], [facility]   Overall Financial Resource Strain (CARDIA)    Difficulty of Paying Living Expenses: Not hard at all  Food Insecurity: Low Risk  (03/26/2023)   [facility]   Hunger Vital Sign    Worried About Running Out of Food in the Last Year: Never true    Ran Out of Food in the Last Year: Never true  Transportation Needs: Not on file (03/26/2023)  Physical Activity: Not on file  Stress: No Stress Concern Present (07/18/2022)   Received from [facility], [facility]   [employer] of Occupational Health - Occupational Stress Questionnaire    Feeling of Stress : Not at all  Social Connections: Unknown (02/24/2022)   [facility] from [facility], [facility]   Social Network    Social Network: Not on file     Review of Systems: A 12 point ROS discussed  and pertinent positives are indicated in the HPI above.  All other systems are negative.  Review of Systems  Constitutional:  Negative for fever.  HENT:  Negative for congestion.   Respiratory:  Negative for cough and shortness of breath.   Cardiovascular:  Negative for chest pain.  Gastrointestinal:  Negative for abdominal pain.  Genitourinary:  Positive for penile pain.  Neurological:  Negative for headaches.  Psychiatric/Behavioral:  Negative for behavioral problems and confusion.     Vital Signs: BP (!) 152/77   Pulse 89   Temp 98 F (36.7 C) (Oral)   Resp 18   Ht 6\' 1"  (  1.854 m)   Wt 173 lb (78.5 kg)   SpO2 95%   BMI 22.82 kg/m   Advance Care Plan: The advanced care plan/surrogate decision maker was discussed at the time of visit and documented in the medical record. daughter   Physical Exam Vitals and nursing note reviewed.  Constitutional:      Appearance: He is well-developed.  HENT:     Head: Normocephalic.     Mouth/Throat:     Mouth: Mucous membranes are moist.  Cardiovascular:     Rate and Rhythm: Normal rate and regular rhythm.  Pulmonary:     Effort: Pulmonary effort is normal.  Musculoskeletal:        General: Normal range of motion.     Cervical back: Normal range of motion.  Skin:    General: Skin is dry.  Neurological:     General: No focal deficit present.     Mental Status: He is alert and oriented to person, place, and time.     Imaging: No results found.  Labs:  CBC: Recent Labs    11/02/22 1922 03/12/23 1321  WBC 8.9 11.3*  HGB 11.0* 12.4*  HCT 31.2* 36.5*  PLT 381 372    COAGS: No results for input(s): "INR", "APTT" in the last 8760 hours.  BMP: Recent Labs    11/02/22 1922 03/12/23 1321  NA 127* 135  K 4.2 3.9  CL 92* 99  CO2 26 26  GLUCOSE 130* 116*  BUN 31* 23  CALCIUM 9.3 9.5  CREATININE 1.92* 1.52*  GFRNONAA 35* 47*    LIVER FUNCTION TESTS: Recent Labs    01/16/23 1108  PROT 7.7     Assessment  and Plan:  77 y.o. male inpatient. History of melanoma, HTN, HLD, GERD, DM, chronic back pain s/p 2- C3 fusion, Left epididymo-orchitis  and urinary retention. Per request "he would like to be converted to a suprapubic catheter.    Risks and benefits discussed with the patient including bleeding, infection, damage to adjacent structures, bowel perforation/fistula connection, and sepsis.  All of the patient's questions were answered, patient is agreeable to proceed. Consent signed and in chart.   Thank you for this interesting consult.  I greatly enjoyed meeting Steve Hunter and look forward to participating in their care.  A copy of this report was sent to the requesting provider on this date.  Electronically Signed: Alene Mires, NP 09/13/2023, 10:36 AM   I spent a total of  30 Minutes   in face to face in clinical consultation, greater than 50% of which was counseling/coordinating care for suprapubic catheter placement

## 2023-09-13 ENCOUNTER — Encounter (HOSPITAL_COMMUNITY): Payer: Self-pay

## 2023-09-13 ENCOUNTER — Ambulatory Visit (HOSPITAL_COMMUNITY)
Admission: RE | Admit: 2023-09-13 | Discharge: 2023-09-13 | Disposition: A | Discharge status: Home / Self Care | Payer: Medicare Other | Source: Ambulatory Visit | Attending: Urology | Admitting: Urology

## 2023-09-13 DIAGNOSIS — R339 Retention of urine, unspecified: Secondary | ICD-10-CM

## 2023-09-13 LAB — GLUCOSE, CAPILLARY: Glucose-Capillary: 112 mg/dL — ABNORMAL HIGH (ref 70–99)

## 2023-09-13 MED ORDER — SODIUM CHLORIDE 0.9% FLUSH: 10.0000 mL | Freq: Two times a day (BID) | INTRAVENOUS | Status: DC

## 2023-11-05 ENCOUNTER — Other Ambulatory Visit: Payer: Self-pay | Admitting: Radiology

## 2023-11-05 NOTE — H&P (Signed)
 Chief Complaint: Urinary rentention with penile pain thought to be related to foley catheter   Referring Provider(s): Homero Luster  Supervising Physician: Robbi Childs  Patient Status: [facility] - Out-pt  History of Present Illness: Steve Hunter is a 78 y.o. male with history of melanoma, HTN, HLD, GERD, DM, chronic back pain s/p 2- C3 fusion, Left epididymo-orchitis and urinary retention.   He would like to be converted to a suprapubic catheter due to penile pain.   He was initially here for the procedure on 09/13/23, however he complained of persistent diarrhea and was unable to stay for the procedure.  He is rescheduled to today.  He is NPO. No nausea/vomiting. No Fever/chills. ROS negative.  Patient is Full Code  Past Medical History:  Diagnosis Date   Anxiety disorder    Aortic stenosis    Mild   Cancer (HCC)    Bladder   Chronic back pain    Diabetes mellitus    Type II   GERD (gastroesophageal reflux disease)    Hypercholesterolemia    Hypertension    Melanoma (HCC)    Osteoarthritis    Sleep apnea    no cpap    Past Surgical History:  Procedure Laterality Date   ANTERIOR CERVICAL DECOMP/DISCECTOMY FUSION N/A 03/21/2023   Procedure: ANTERIOR CERVICAL DECOMPRESSION/DISCECTOMY FUSION 1 LEVEL C3-4;  Surgeon: Mort Ards, MD;  Location: [facility] OR;  Service: Orthopedics;  Laterality: N/A;   BACK SURGERY     BLADDER SURGERY     carpal tunnel surgery     CATARACT EXTRACTION W/ INTRAOCULAR LENS IMPLANT Bilateral    5 years ago   CYSTOSCOPY WITH INSERTION OF UROLIFT  07/18/2022   MELANOMA EXCISION     SHOULDER SURGERY Left    TONSILLECTOMY     TOTAL KNEE ARTHROPLASTY     left   WRIST SURGERY     Right wrist    Allergies: Lexiscan  [regadenoson ]  Medications: Prior to Admission medications   Medication Sig Start Date End Date Taking? Authorizing Provider  Accu-Chek Softclix Lancets lancets USE AS DIRECTED 2-3 TIMES DAILY 05/25/19   [provider]  atorvastatin  (LIPITOR) 40 MG tablet Take 40 mg by mouth daily. 04/15/19   [provider]  finasteride  (PROSCAR ) 5 MG tablet Take 5 mg by mouth daily. 07/17/19   [provider]  glucose blood (ACCU-CHEK AVIVA PLUS) test strip USE AS DIRECTED 2-3 TIMES DAILY 05/26/19   [provider]  hydrochlorothiazide  (HYDRODIURIL ) 12.5 MG tablet Take 12.5 mg by mouth daily. 11/08/19   [provider]  linaclotide  (LINZESS ) 72 MCG capsule Take 72 mcg by mouth daily as needed (constipation). 11/29/22   [provider]  LORazepam  (ATIVAN ) 0.5 MG tablet Take 0.5 mg by mouth every 8 (eight) hours as needed for anxiety.    [provider]  losartan  (COZAAR ) 100 MG tablet Take 100 mg by mouth daily. 04/15/19   [provider]  metFORMIN  (GLUCOPHAGE -XR) 500 MG 24 hr tablet Take 500-1,000 mg by mouth See admin instructions. Take 1000 mg by mouth in the morning and 500 mg in the evening 09/30/19   [provider]  ondansetron  (ZOFRAN ) 4 MG tablet Take 1 tablet (4 mg total) by mouth every 8 (eight) hours as needed for nausea or vomiting. 03/21/23   Mort Ards, MD  pantoprazole  (PROTONIX ) 40 MG tablet Take 40 mg by mouth 2 (two) times daily. 04/15/19   [provider]  Polyethyl Glycol-Propyl Glycol 0.4-0.3 %  SOLN Place 1 drop into both eyes 3 (three) times daily as needed (dry eyes).    [provider]  tamsulosin  (FLOMAX ) 0.4 MG CAPS capsule Take 0.4 mg by mouth daily. 08/20/19   [provider]  zolpidem (AMBIEN) 10 MG tablet Take 5-10 mg by mouth at bedtime as needed for sleep.    [provider]     Family History  Problem Relation Age of Onset   Hypertension Mother    Lung cancer Father     Social History   Socioeconomic History   Marital status: Married    Spouse name: Not on file   Number of children: 2   Years of education: Not on file   Highest education level: Not on file  Occupational History    Occupation: retired Scientist, water quality: RETIRED  Tobacco Use   Smoking status: Former    Current packs/day: 0.00    Types: Cigarettes    Quit date: 07/02/1980    Years since quitting: 43.3   Smokeless tobacco: Never  Vaping Use   Vaping status: Never Used  Substance and Sexual Activity   Alcohol use: Yes   Drug use: Never   Sexual activity: Not on file  Other Topics Concern   Not on file  Social History Narrative   Right handed   Wears prescription glasses    Drinks coffee daily    Social Drivers of Health   Financial Resource Strain: Low Risk  (11/06/2022)   Received from [facility]   Overall Financial Resource Strain (CARDIA)    Difficulty of Paying Living Expenses: Not hard at all  Food Insecurity: Low Risk  (03/26/2023)   [facility]   Hunger Vital Sign    Worried About Running Out of Food in the Last Year: Never true    Ran Out of Food in the Last Year: Never true  Transportation Needs: Not on file (03/26/2023)  Physical Activity: Not on file  Stress: No Stress Concern Present (07/18/2022)   Received from [facility]   [employer] of Occupational Health - Occupational Stress Questionnaire    Feeling of Stress : Not at all  Social Connections: Unknown (02/24/2022)   Received from [facility], [facility]   Social Network    Social Network: Not on file     Review of Systems: A 12 point ROS discussed and pertinent positives are indicated in the HPI above.  All other systems are negative.   Vital Signs: BP 117/62   Pulse 100   Temp 97.8 F (36.6 C) (Oral)   Resp 19   Ht 6' (1.829 m)   Wt 172 lb (78 kg)   SpO2 100%   BMI 23.33 kg/m   Advance Care Plan: The advanced care place/surrogate decision maker was discussed at the time of visit and the patient did not wish to discuss or was not able to name a surrogate decision maker or provide an advance care plan.  Physical Exam Constitutional:       Appearance: Normal appearance.  HENT:     Head: Normocephalic and atraumatic.  Cardiovascular:     Rate and Rhythm: Normal rate and regular rhythm.  Pulmonary:     Effort: Pulmonary effort is normal. No respiratory distress.     Breath sounds: Normal breath sounds.  Abdominal:     Palpations: Abdomen is soft.  Skin:    General: Skin is warm and dry.  Neurological:     General: No focal deficit present.     Mental Status: He is alert and oriented to person, place, and time.  Psychiatric:        Mood and Affect: Mood normal.        Behavior: Behavior normal.        Thought Content: Thought content normal.        Judgment: Judgment normal.     Imaging: No results found.  Labs:  CBC: Recent Labs    03/12/23 1321  WBC 11.3*  HGB 12.4*  HCT 36.5*  PLT 372    COAGS: No results for input(s): "INR", "APTT" in the last 8760 hours.  BMP: Recent Labs    03/12/23 1321  NA 135  K 3.9  CL 99  CO2 26  GLUCOSE 116*  BUN 23  CALCIUM  9.5  CREATININE 1.52*  GFRNONAA 47*    LIVER FUNCTION TESTS: Recent Labs    01/16/23 1108  PROT 7.7    TUMOR MARKERS: No results for input(s): "AFPTM", "CEA", "CA199", "CHROMGRNA" in the last 8760 hours.  Assessment and Plan:  Left epididymo-orchitis and urinary retention.   He would like to be converted to a suprapubic catheter due to penile pain.   Will proceed with image guided SP catheter placement today by Dr. Jinx Mourning.  Risks and benefits of SP tube placement discussed with the patient including bleeding, infection, damage to bladder or adjacent structures, fistula connection, and sepsis.  All of the patient's questions were answered, patient is agreeable to proceed. Consent signed and in chart.  Thank you for allowing our service to participate in Steve Hunter 's care.  Electronically Signed: Connor Deiters, PA-C   11/06/2023, 9:16 AM      I spent a total of  15 Minutes  in face to face in clinical  consultation, greater than 50% of which was counseling/coordinating care for SP tube placement.

## 2023-11-06 ENCOUNTER — Other Ambulatory Visit: Payer: Self-pay

## 2023-11-06 ENCOUNTER — Ambulatory Visit (HOSPITAL_COMMUNITY)
Admission: RE | Admit: 2023-11-06 | Discharge: 2023-11-06 | Disposition: A | Discharge status: Home / Self Care | Payer: Medicare Other | Source: Ambulatory Visit | Attending: Urology | Admitting: Urology

## 2023-11-06 DIAGNOSIS — N4889 Other specified disorders of penis: Secondary | ICD-10-CM | POA: Diagnosis not present

## 2023-11-06 DIAGNOSIS — Z87891 Personal history of nicotine dependence: Secondary | ICD-10-CM | POA: Insufficient documentation

## 2023-11-06 DIAGNOSIS — E785 Hyperlipidemia, unspecified: Secondary | ICD-10-CM | POA: Diagnosis not present

## 2023-11-06 DIAGNOSIS — K219 Gastro-esophageal reflux disease without esophagitis: Secondary | ICD-10-CM | POA: Diagnosis not present

## 2023-11-06 DIAGNOSIS — Z7984 Long term (current) use of oral hypoglycemic drugs: Secondary | ICD-10-CM | POA: Insufficient documentation

## 2023-11-06 DIAGNOSIS — E119 Type 2 diabetes mellitus without complications: Secondary | ICD-10-CM | POA: Diagnosis not present

## 2023-11-06 DIAGNOSIS — R339 Retention of urine, unspecified: Secondary | ICD-10-CM | POA: Diagnosis not present

## 2023-11-06 DIAGNOSIS — N453 Epididymo-orchitis: Secondary | ICD-10-CM | POA: Insufficient documentation

## 2023-11-06 DIAGNOSIS — I10 Essential (primary) hypertension: Secondary | ICD-10-CM | POA: Diagnosis not present

## 2023-11-06 DIAGNOSIS — B9689 Other specified bacterial agents as the cause of diseases classified elsewhere: Secondary | ICD-10-CM | POA: Diagnosis not present

## 2023-11-06 DIAGNOSIS — Z01812 Encounter for preprocedural laboratory examination: Secondary | ICD-10-CM | POA: Insufficient documentation

## 2023-11-06 DIAGNOSIS — G8929 Other chronic pain: Secondary | ICD-10-CM | POA: Insufficient documentation

## 2023-11-06 LAB — GLUCOSE, CAPILLARY: Glucose-Capillary: 134 mg/dL — ABNORMAL HIGH (ref 70–99)

## 2023-11-06 MED ORDER — OXYCODONE HCL 5 MG PO TABS
10.0000 mg | ORAL_TABLET | Freq: Once | ORAL | Status: AC
  Administered 2023-11-06: 10 mg via ORAL
  Filled 2023-11-06: qty 2

## 2023-11-06 MED ORDER — OXYBUTYNIN CHLORIDE 5 MG PO TABS
5.0000 mg | ORAL_TABLET | Freq: Once | ORAL | Status: AC
  Administered 2023-11-06: 5 mg via ORAL
  Filled 2023-11-06: qty 1

## 2023-11-06 MED ORDER — MIDAZOLAM HCL 2 MG/2ML IJ SOLN
INTRAMUSCULAR | Status: AC
  Filled 2023-11-06: qty 4

## 2023-11-06 MED ORDER — SODIUM CHLORIDE 0.9 % IV SOLN: INTRAVENOUS | Status: DC

## 2023-11-06 MED ORDER — FENTANYL CITRATE (PF) 100 MCG/2ML IJ SOLN
INTRAMUSCULAR | Status: AC
  Filled 2023-11-06: qty 4

## 2023-11-06 MED ORDER — OXYBUTYNIN CHLORIDE 5 MG PO TABS
5.0000 mg | ORAL_TABLET | Freq: Once | ORAL | Status: DC
  Filled 2023-11-06: qty 1

## 2023-11-06 MED ORDER — FENTANYL CITRATE (PF) 100 MCG/2ML IJ SOLN
INTRAMUSCULAR | Status: AC | PRN
Start: 2023-11-06 — End: 2023-11-06
  Administered 2023-11-06: 50 ug via INTRAVENOUS

## 2023-11-06 NOTE — Sedation Documentation (Signed)
 Sedation case cancelled/ pt d/c'd from IR 1hr post pre-procedure administration of fentanyl .No complications or adverse reactions. Pt left to home with daughter, Burdette Carolin

## 2023-11-06 NOTE — Sedation Documentation (Signed)
 At 0940 Patient demanded that the Foley catheter be unclamped. After multiple attempts to persuade the patient otherwise, Clinical research associate acquiesced. MD bedside and assuring patient that the scheduling team will be in touch to schedule general anesthesia

## 2023-11-06 NOTE — Progress Notes (Signed)
 Urinary foley clamped for suprapubic catheter placement.

## 2023-11-07 ENCOUNTER — Other Ambulatory Visit: Payer: Self-pay

## 2023-11-07 ENCOUNTER — Other Ambulatory Visit (HOSPITAL_COMMUNITY): Payer: Self-pay | Admitting: Student

## 2023-11-07 ENCOUNTER — Encounter (HOSPITAL_COMMUNITY): Payer: Self-pay | Admitting: *Deleted

## 2023-11-07 DIAGNOSIS — R339 Retention of urine, unspecified: Secondary | ICD-10-CM

## 2023-11-07 NOTE — Anesthesia Preprocedure Evaluation (Addendum)
Anesthesia Evaluation  Patient identified by MRN, date of birth, ID band Patient awake    Reviewed: Allergy & Precautions, NPO status , Patient's Chart, lab work & pertinent test results  Airway Mallampati: II  TM Distance: >3 FB Neck ROM: Full    Dental  (+) Dental Advisory Given   Pulmonary sleep apnea , former smoker   breath sounds clear to auscultation       Cardiovascular hypertension,  Rhythm:Regular     Neuro/Psych  PSYCHIATRIC DISORDERS Anxiety        GI/Hepatic hiatal hernia,GERD  ,,  Endo/Other  diabetes    Renal/GU      Musculoskeletal  (+) Arthritis ,    Abdominal   Peds  Hematology   Anesthesia Other Findings   Reproductive/Obstetrics                             Anesthesia Physical Anesthesia Plan  ASA: 2  Anesthesia Plan: MAC   Post-op Pain Management:    Induction: Intravenous  PONV Risk Score and Plan: 1 and Propofol infusion  Airway Management Planned: Nasal Cannula, Natural Airway and Simple Face Mask  Additional Equipment: None  Intra-op Plan:   Post-operative Plan:   Informed Consent: I have reviewed the patients History and Physical, chart, labs and discussed the procedure including the risks, benefits and alternatives for the proposed anesthesia with the patient or authorized representative who has indicated his/her understanding and acceptance.     Dental advisory given  Plan Discussed with: CRNA  Anesthesia Plan Comments: (PAT note written 11/07/2023 by Shonna Chock, PA-C.  )       Anesthesia Quick Evaluation

## 2023-11-07 NOTE — Progress Notes (Signed)
Anesthesia Chart Review: Steve Hunter  Case: [id number] Date/Time: 11/08/23 0745   Procedure: CT GUIDED SUPRAPUBIC CATHETER PLACEMENT WITH ANESTHESIA   Anesthesia type: General   Pre-op diagnosis: urinary retention   [facility] RAD[facility]   Surgeons: Radiologist, Medication, MD       DISCUSSION: Patient is a 78 year old scheduled for the above procedure. IR unable to place CT guided suprapubic catheter on 09/13/23 due to GI issues or on 11/06/23 due to inability to tolerate clamping of foley due to severe and refractory pain despite IV pre-procedure narcotics. Procedure rescheduled with anesthesia.  History includes  former smoker (quit 07/02/80), HTN, cholesterolemia, aortic stenosis (very mild AS, mean grad 6 mmHg, peak grad 15 mmHg 06/2015), GERD, DM2, OSA (s/p uvulectomy; does not use CPAP), bladder cancer (s/p TURBT 2001; s/p Urolift and bladder biopsy/fulguration 07/18/22), melanoma (s/p excision), anxiety, chronic back pain, spinal surgery (L4-S1 PLIF 04/10/16; C3-4 ACDF 03/21/23), arthritis (left TKA 2000). Lab trends suggest CKD (Creatinine ~ 1.4 - 1.9 since 05/2022).    Last cardiology evaluation with Dr. Swaziland was on 01/07/2023.  He noted with patient had minimal aortic stenosis by echocardiogram in 2016.  There is no change in exam and patient remained asymptomatic.  Hypertension was well-controlled.  He recommended continue risk factor modification.  In regards to upcoming back and bladder surgeries he wrote, "Patient is a suitable candidate for back surgery and or Bladder surgery if needed.  No additional cardiac testing needed.  I think he is low risk."  Reported last ASA scheduled for 10/31/2023.   Anesthesia team to evaluate on the day of surgery.   VS:  Wt Readings from Last 3 Encounters:  11/07/23 78.5 kg  11/06/23 78 kg  09/13/23 78.5 kg   BP Readings from Last 3 Encounters:  11/06/23 117/62  09/13/23 (!) 152/77  03/23/23 138/75   Pulse Readings from Last 3  Encounters:  11/06/23 100  09/13/23 89  03/23/23 92     PROVIDERS: Barbie Banner, MD is PCP, recently retied. Seeing a new provider at the same office.   [place], Peter, MD is cardiologist Bjorn Pippin, MD is urologist. Also seen by Jannetta Quint, MD with [facility].   Rinaldo Ratel, MD is GI Despina Arias, MD is neurologist    LABS: He had labs from 09/2023 in [facility] Everywhere: - 10/22/23: Glucose 140, BUN 14, Creatinine 1.41, eGFR 51, Na 139, K 4.6, Ca 10.3. - 10/11/23: Creatinine 1.39, eGFR 52, AST 14, ALT 14, WBC 10.3, H/H 12.0/36.5, PLT 461.  - A1c 6.5% 04/11/23   IMAGES:  CT L-spine 01/08/23: IMPRESSION: Degenerative and postsurgical changes. No acute osseous abnormalities identified.     EKG: EKG 01/07/2023: Normal sinus rhythm Left anterior fascicular block     CV: Echo 07/04/15: Study Conclusions  - Left ventricle: The cavity size was normal. Wall thickness was    increased in a pattern of mild LVH. Systolic function was normal.    The estimated ejection fraction was in the range of 55% to 60%.    Wall motion was normal; there were no regional wall motion    abnormalities. Doppler parameters are consistent with abnormal    left ventricular relaxation (grade 1 diastolic dysfunction).  - Aortic valve: There was very mild stenosis. There was no regurgitation.    VTI ratio of LVOT to aortic valve: 0.62. Valve area (VTI): 2.37 cm^2. Indexed valve area (VTI): 1.04 cm^2/m^2. Peak velocity ratio of LVOT to aortic valve: 0.54.  Valve area (Vmax): 2.04 cm^2. Indexed valve area  (Vmax): 0.9 cm^2/m^2. Mean velocity ratio of LVOT to aortic valve:  0.6. Valve area (Vmean): 2.27 cm^2. Indexed valve area (Vmean): 1  cm^2/m^2.    Mean gradient (S): 6 mm Hg. Peak gradient (S): 15 mm  Hg.      Nuclear stress test 06/04/12: Overall Impression:  Normal stress nuclear study.  No evidence of ischemia.  Normal LV function. LV Ejection Fraction: 55%.  LV Wall Motion:  NL LV Function;  NL Wall Motion    Past Medical History:  Diagnosis Date   Anxiety disorder    Aortic stenosis    Mild   Cancer (HCC)    Bladder   Chronic back pain    Diabetes mellitus    Type II   GERD (gastroesophageal reflux disease)    Hypercholesterolemia    Hypertension    Melanoma (HCC)    Osteoarthritis    Sleep apnea    no cpap    Past Surgical History:  Procedure Laterality Date   ANTERIOR CERVICAL DECOMP/DISCECTOMY FUSION N/A 03/21/2023   Procedure: ANTERIOR CERVICAL DECOMPRESSION/DISCECTOMY FUSION 1 LEVEL C3-4;  Surgeon: Venita Lick, MD;  Location: [facility] OR;  Service: Orthopedics;  Laterality: N/A;   BACK SURGERY     BLADDER SURGERY     carpal tunnel surgery     CATARACT EXTRACTION W/ INTRAOCULAR LENS IMPLANT Bilateral    5 years ago   CYSTOSCOPY WITH INSERTION OF UROLIFT  07/18/2022   MELANOMA EXCISION     SHOULDER SURGERY Left    TONSILLECTOMY     TOTAL KNEE ARTHROPLASTY     left   WRIST SURGERY     Right wrist    MEDICATIONS: No current facility-administered medications for this encounter.    aspirin EC 81 MG tablet   atorvastatin (LIPITOR) 40 MG tablet   finasteride (PROSCAR) 5 MG tablet   linaclotide (LINZESS) 72 MCG capsule   LORazepam (ATIVAN) 1 MG tablet   losartan (COZAAR) 50 MG tablet   metFORMIN (GLUCOPHAGE-XR) 500 MG 24 hr tablet   nortriptyline (PAMELOR) 25 MG capsule   ondansetron (ZOFRAN) 4 MG tablet   pantoprazole (PROTONIX) 40 MG tablet   Polyethyl Glycol-Propyl Glycol 0.4-0.3 % SOLN   tamsulosin (FLOMAX) 0.4 MG CAPS capsule   Accu-Chek Softclix Lancets lancets   glucose blood (ACCU-CHEK AVIVA PLUS) test strip    Shonna Chock, PA-C Surgical Short Stay/Anesthesiology [facility] Phone [phone] [facility] Phone [phone] 11/07/2023 10:44 AM

## 2023-11-07 NOTE — Progress Notes (Signed)
PCP - Dr. Benedetto Goad- pt states he has retired and can't remember his new PCP's name but it is the same office  Cardiologist - Dr. Peter Swaziland  PPM/ICD - denies   Chest x-ray - 03/10/22- CE EKG - 01/07/23 Stress Test - 06/04/12 ECHO - 07/04/15 Cardiac Cath - denies  CPAP - Pt states he no longer has issues with OSA since he had his tonsils removed  Fasting Blood Sugar - 120-130 Checks Blood Sugar every other day  Blood Thinner Instructions: n/a Aspirin Instructions: Pt took last dose on 1/9, and will not take anymore before surgery  ERAS Protcol - clears until 0500  COVID TEST- n/a  Anesthesia review: yes, cardiac hx  Patient verbally denies any shortness of breath, fever, cough and chest pain during phone call      Questions were answered. Patient verbalized understanding of instructions.

## 2023-11-07 NOTE — Pre-Procedure Instructions (Signed)
-------------    SDW INSTRUCTIONS given:  Your procedure is scheduled on 1/17.  Report to [facility] Main Entrance "A" at 05:30 A.M., and check in at the Admitting office.  Any questions or running late day of surgery: call [phone]    Remember:  Do not eat after midnight the night before your surgery  You may drink clear liquids until 05:00 AM the morning of your surgery.   Clear liquids allowed are: Water, Non-Citrus Juices (without pulp), Carbonated Beverages, Clear Tea, Black Coffee Only, and Gatorade    Take these medicines the morning of surgery with A SIP OF WATER  lipitor, finasteride, zofran PRN, protonix, polyethyl glycol eye drops PRN, flomax    As of today, STOP taking any Aspirin (unless otherwise instructed by your surgeon) Aleve, Naproxen, Ibuprofen, Motrin, Advil, Goody's, BC's, all herbal medications, fish oil, and all vitamins.   Do NOT Smoke (Tobacco/Vaping) 24 hours prior to your procedure  If you use a CPAP at night, you may bring all equipment for your overnight stay.     You will be asked to remove any contacts, glasses, piercing's, hearing aid's, dentures/partials prior to surgery. Please bring cases for these items if needed.     Patients discharged the day of surgery will not be allowed to drive home, and someone needs to stay with them for 24 hours.  SURGICAL WAITING ROOM VISITATION Patients may have no more than 2 support people in the waiting area - these visitors may rotate.   Pre-op nurse will coordinate an appropriate time for 1 ADULT support person, who may not rotate, to accompany patient in pre-op.  Children under the age of 29 must have an adult with them who is not the patient and must remain in the main waiting area with an adult.  If the patient needs to stay at the hospital during part of their recovery, the visitor guidelines for inpatient rooms apply.  Please refer to the [facility] website for the visitor guidelines for any additional  information.   Special instructions:   [place]- Preparing For Surgery   Please follow these instructions carefully.   Shower the NIGHT BEFORE SURGERY and the MORNING OF SURGERY with DIAL Soap.   Pat yourself dry with a CLEAN TOWEL.  Wear CLEAN PAJAMAS to bed the night before surgery  Place CLEAN SHEETS on your bed the night of your first shower and DO NOT SLEEP WITH PETS.   Additional instructions for the day of surgery: DO NOT APPLY any lotions, deodorants, cologne, or perfumes.   Do not wear jewelry or makeup Do not wear nail polish, gel polish, artificial nails, or any other type of covering on natural nails (fingers and toes) Do not bring valuables to the hospital. [facility] is not responsible for valuables/personal belongings. Put on clean/comfortable clothes.  Please brush your teeth.  Ask your nurse before applying any prescription medications to the skin.

## 2023-11-08 ENCOUNTER — Ambulatory Visit (HOSPITAL_COMMUNITY)
Admission: RE | Admit: 2023-11-08 | Discharge: 2023-11-08 | Disposition: A | Discharge status: Home / Self Care | Payer: Medicare Other | Attending: Urology | Admitting: Urology

## 2023-11-08 ENCOUNTER — Ambulatory Visit (HOSPITAL_COMMUNITY): Payer: Medicare Other | Admitting: Vascular Surgery

## 2023-11-08 ENCOUNTER — Encounter (HOSPITAL_COMMUNITY): Payer: Self-pay | Admitting: Urology

## 2023-11-08 ENCOUNTER — Ambulatory Visit (HOSPITAL_COMMUNITY)
Admission: RE | Admit: 2023-11-08 | Discharge: 2023-11-08 | Disposition: A | Discharge status: Home / Self Care | Payer: Medicare Other | Source: Ambulatory Visit | Attending: Urology | Admitting: Urology

## 2023-11-08 ENCOUNTER — Encounter (HOSPITAL_COMMUNITY)
Admission: RE | Disposition: A | Discharge status: Home / Self Care | Payer: Self-pay | Source: Home / Self Care | Attending: Urology

## 2023-11-08 ENCOUNTER — Other Ambulatory Visit: Payer: Self-pay | Admitting: Student

## 2023-11-08 DIAGNOSIS — Z87891 Personal history of nicotine dependence: Secondary | ICD-10-CM | POA: Insufficient documentation

## 2023-11-08 DIAGNOSIS — I10 Essential (primary) hypertension: Secondary | ICD-10-CM | POA: Insufficient documentation

## 2023-11-08 DIAGNOSIS — I350 Nonrheumatic aortic (valve) stenosis: Secondary | ICD-10-CM | POA: Insufficient documentation

## 2023-11-08 DIAGNOSIS — N401 Enlarged prostate with lower urinary tract symptoms: Secondary | ICD-10-CM | POA: Insufficient documentation

## 2023-11-08 DIAGNOSIS — R338 Other retention of urine: Secondary | ICD-10-CM | POA: Diagnosis not present

## 2023-11-08 DIAGNOSIS — R3912 Poor urinary stream: Secondary | ICD-10-CM | POA: Diagnosis not present

## 2023-11-08 DIAGNOSIS — G4733 Obstructive sleep apnea (adult) (pediatric): Secondary | ICD-10-CM | POA: Insufficient documentation

## 2023-11-08 DIAGNOSIS — E1140 Type 2 diabetes mellitus with diabetic neuropathy, unspecified: Secondary | ICD-10-CM | POA: Insufficient documentation

## 2023-11-08 DIAGNOSIS — N4889 Other specified disorders of penis: Secondary | ICD-10-CM | POA: Diagnosis not present

## 2023-11-08 DIAGNOSIS — M1990 Unspecified osteoarthritis, unspecified site: Secondary | ICD-10-CM | POA: Insufficient documentation

## 2023-11-08 DIAGNOSIS — E7800 Pure hypercholesterolemia, unspecified: Secondary | ICD-10-CM | POA: Insufficient documentation

## 2023-11-08 DIAGNOSIS — R339 Retention of urine, unspecified: Secondary | ICD-10-CM | POA: Diagnosis present

## 2023-11-08 DIAGNOSIS — K219 Gastro-esophageal reflux disease without esophagitis: Secondary | ICD-10-CM | POA: Diagnosis not present

## 2023-11-08 DIAGNOSIS — N453 Epididymo-orchitis: Secondary | ICD-10-CM | POA: Insufficient documentation

## 2023-11-08 DIAGNOSIS — Z7984 Long term (current) use of oral hypoglycemic drugs: Secondary | ICD-10-CM | POA: Diagnosis not present

## 2023-11-08 HISTORY — DX: Nontoxic multinodular goiter: E04.2

## 2023-11-08 HISTORY — DX: Personal history of other diseases of the digestive system: Z87.19

## 2023-11-08 HISTORY — PX: RADIOLOGY WITH ANESTHESIA: SHX6223

## 2023-11-08 LAB — BASIC METABOLIC PANEL
Anion gap: 10 (ref 5–15)
BUN: 14 mg/dL (ref 8–23)
CO2: 26 mmol/L (ref 22–32)
Calcium: 9.3 mg/dL (ref 8.9–10.3)
Chloride: 99 mmol/L (ref 98–111)
Creatinine, Ser: 1.43 mg/dL — ABNORMAL HIGH (ref 0.61–1.24)
GFR, Estimated: 50 mL/min — ABNORMAL LOW (ref >60)
Glucose, Bld: 125 mg/dL — ABNORMAL HIGH (ref 70–99)
Potassium: 4.3 mmol/L (ref 3.5–5.1)
Sodium: 135 mmol/L (ref 135–145)

## 2023-11-08 LAB — CBC
HCT: 34.4 % — ABNORMAL LOW (ref 39.0–52.0)
Hemoglobin: 11.7 g/dL — ABNORMAL LOW (ref 13.0–17.0)
MCH: 30.8 pg (ref 26.0–34.0)
MCHC: 34 g/dL (ref 30.0–36.0)
MCV: 90.5 fL (ref 80.0–100.0)
Platelets: 329 10*3/uL (ref 150–400)
RBC: 3.8 MIL/uL — ABNORMAL LOW (ref 4.22–5.81)
RDW: 13.2 % (ref 11.5–15.5)
WBC: 7.8 10*3/uL (ref 4.0–10.5)
nRBC: 0 % (ref 0.0–0.2)

## 2023-11-08 LAB — GLUCOSE, CAPILLARY
Glucose-Capillary: 117 mg/dL — ABNORMAL HIGH (ref 70–99)
Glucose-Capillary: 120 mg/dL — ABNORMAL HIGH (ref 70–99)

## 2023-11-08 LAB — PROTIME-INR
INR: 1.1 (ref 0.8–1.2)
Prothrombin Time: 14.2 s (ref 11.4–15.2)

## 2023-11-08 SURGERY — CT WITH ANESTHESIA
Anesthesia: General

## 2023-11-08 MED ORDER — ACETAMINOPHEN 10 MG/ML IV SOLN
INTRAVENOUS | Status: AC
  Filled 2023-11-08: qty 100

## 2023-11-08 MED ORDER — CHLORHEXIDINE GLUCONATE 0.12 % MT SOLN: 15.0000 mL | Freq: Once | OROMUCOSAL | Status: AC

## 2023-11-08 MED ORDER — ACETAMINOPHEN 10 MG/ML IV SOLN
1000.0000 mg | Freq: Once | INTRAVENOUS | Status: AC
  Administered 2023-11-08: 1000 mg via INTRAVENOUS

## 2023-11-08 MED ORDER — LACTATED RINGERS IV SOLN: INTRAVENOUS | Status: DC

## 2023-11-08 MED ORDER — SODIUM CHLORIDE 0.9 % IV SOLN: INTRAVENOUS | Status: DC | PRN

## 2023-11-08 MED ORDER — KETOROLAC TROMETHAMINE 15 MG/ML IJ SOLN
15.0000 mg | Freq: Once | INTRAMUSCULAR | Status: DC
  Filled 2023-11-08: qty 1

## 2023-11-08 MED ORDER — OXYCODONE HCL 5 MG PO TABS: 5.0000 mg | ORAL_TABLET | ORAL | 0 refills | Status: AC | PRN

## 2023-11-08 MED ORDER — OXYCODONE HCL 5 MG PO TABS
ORAL_TABLET | ORAL | Status: AC
  Filled 2023-11-08: qty 1

## 2023-11-08 MED ORDER — CHLORHEXIDINE GLUCONATE 0.12 % MT SOLN
OROMUCOSAL | Status: AC
  Administered 2023-11-08: 15 mL via OROMUCOSAL
  Filled 2023-11-08: qty 15

## 2023-11-08 MED ORDER — INSULIN ASPART 100 UNIT/ML IJ SOLN: 0.0000 [IU] | INTRAMUSCULAR | Status: DC | PRN

## 2023-11-08 MED ORDER — PROPOFOL 500 MG/50ML IV EMUL
INTRAVENOUS | Status: DC | PRN
  Administered 2023-11-08: 50 ug/kg/min via INTRAVENOUS

## 2023-11-08 MED ORDER — FENTANYL CITRATE (PF) 100 MCG/2ML IJ SOLN
INTRAMUSCULAR | Status: DC | PRN
  Administered 2023-11-08 (×2): 50 ug via INTRAVENOUS

## 2023-11-08 MED ORDER — PROPOFOL 10 MG/ML IV BOLUS
INTRAVENOUS | Status: DC | PRN
  Administered 2023-11-08: 30 mg via INTRAVENOUS
  Administered 2023-11-08: 40 mg via INTRAVENOUS

## 2023-11-08 MED ORDER — OXYCODONE HCL 5 MG PO TABS
5.0000 mg | ORAL_TABLET | Freq: Once | ORAL | Status: AC
Start: 2023-11-08 — End: 2023-11-08
  Administered 2023-11-08: 5 mg via ORAL

## 2023-11-08 MED ORDER — ORAL CARE MOUTH RINSE: 15.0000 mL | Freq: Once | OROMUCOSAL | Status: AC

## 2023-11-08 MED ORDER — LIDOCAINE HCL (PF) 1 % IJ SOLN
10.0000 mL | Freq: Once | INTRAMUSCULAR | Status: AC
  Administered 2023-11-08: 10 mL
  Filled 2023-11-08: qty 10

## 2023-11-08 MED ORDER — FENTANYL CITRATE (PF) 100 MCG/2ML IJ SOLN
INTRAMUSCULAR | Status: AC
  Filled 2023-11-08: qty 2

## 2023-11-08 NOTE — Sedation Documentation (Signed)
250 ml saline bolus to bladder via foley per Dr. Loreta Ave

## 2023-11-08 NOTE — H&P (Signed)
Chief Complaint: Urinary rentention with penile pain thought to be related to foley catheter   Referring Provider(s): Bjorn Pippin  Supervising Physician: Simonne Come  Patient Status: [facility] - Out-pt  History of Present Illness: Steve Hunter is a 78 y.o. male with history of melanoma, HTN, HLD, GERD, DM, chronic back pain s/p 2- C3 fusion, Left epididymo-orchitis and urinary retention.   He would like to be converted to a suprapubic catheter due to penile pain.   Patient was originally scheduled for SPT placement 09/13/23, however could not tolerate the procedure due to persistent diarrhea.  He rescheduled for 11/05/22 however could not tolerate clamping/bladder filling with moderate sedation.   Patient now presents for SPT placement today with anesthesia.   He is NPO. No nausea/vomiting. No Fever/chills. ROS negative.  He is accompanied by his daughter who is available for care and transportation post-procedure.   Patient is Full Code  Past Medical History:  Diagnosis Date   Anxiety disorder    Aortic stenosis    Mild   Cancer (HCC)    Bladder   Chronic back pain    Diabetes mellitus    Type II   GERD (gastroesophageal reflux disease)    History of hiatal hernia    Hypercholesterolemia    Hypertension    Melanoma (HCC)    Multiple thyroid nodules    Osteoarthritis    Sleep apnea    pt states he no longer has this- he had surgery (tonsillectomy) and it resolved    Past Surgical History:  Procedure Laterality Date   ANTERIOR CERVICAL DECOMP/DISCECTOMY FUSION N/A 03/21/2023   Procedure: ANTERIOR CERVICAL DECOMPRESSION/DISCECTOMY FUSION 1 LEVEL C3-4;  Surgeon: Venita Lick, MD;  Location: [facility] OR;  Service: Orthopedics;  Laterality: N/A;   BACK SURGERY     BLADDER SURGERY     carpal tunnel surgery     CATARACT EXTRACTION W/ INTRAOCULAR LENS IMPLANT Bilateral    5 years ago   CYSTOSCOPY WITH INSERTION OF UROLIFT  07/18/2022   MELANOMA EXCISION     SHOULDER  SURGERY Left    TONSILLECTOMY     TOTAL KNEE ARTHROPLASTY     left   WRIST SURGERY     Right wrist    Allergies: Lexiscan [regadenoson] and Vibegron  Medications: Prior to Admission medications   Medication Sig Start Date End Date Taking? Authorizing Provider  Accu-Chek Softclix Lancets lancets USE AS DIRECTED 2-3 TIMES DAILY 05/25/19   [provider]  atorvastatin (LIPITOR) 40 MG tablet Take 40 mg by mouth daily. 04/15/19   [provider]  finasteride (PROSCAR) 5 MG tablet Take 5 mg by mouth daily. 07/17/19   [provider]  glucose blood (ACCU-CHEK AVIVA PLUS) test strip USE AS DIRECTED 2-3 TIMES DAILY 05/26/19   [provider]  hydrochlorothiazide (HYDRODIURIL) 12.5 MG tablet Take 12.5 mg by mouth daily. 11/08/19   [provider]  linaclotide (LINZESS) 72 MCG capsule Take 72 mcg by mouth daily as needed (constipation). 11/29/22   [provider]  LORazepam (ATIVAN) 0.5 MG tablet Take 0.5 mg by mouth every 8 (eight) hours as needed for anxiety.    [provider]  losartan (COZAAR) 100 MG tablet Take 100 mg by mouth daily. 04/15/19   [provider]  metFORMIN (GLUCOPHAGE-XR) 500 MG 24 hr tablet Take 500-1,000 mg by mouth See admin instructions. Take 1000 mg by mouth in the morning and 500 mg in the evening 09/30/19   [provider]  ondansetron (ZOFRAN) 4 MG tablet Take 1 tablet (4 mg total) by mouth every 8 (eight) hours as needed for nausea or vomiting. 03/21/23   Venita Lick, MD  pantoprazole (PROTONIX) 40 MG tablet Take 40 mg by mouth 2 (two) times daily. 04/15/19   [provider]  Polyethyl Glycol-Propyl Glycol 0.4-0.3 % SOLN Place 1 drop into both eyes 3 (three) times daily as needed (dry eyes).    [provider]  tamsulosin (FLOMAX) 0.4 MG CAPS capsule Take 0.4 mg by mouth daily. 08/20/19   [provider]  zolpidem (AMBIEN) 10 MG tablet Take 5-10 mg by mouth at bedtime as  needed for sleep.    [provider]     Family History  Problem Relation Age of Onset   Hypertension Mother    Lung cancer Father     Social History   Socioeconomic History   Marital status: Married    Spouse name: Not on file   Number of children: 2   Years of education: Not on file   Highest education level: Not on file  Occupational History   Occupation: retired Scientist, water quality: RETIRED  Tobacco Use   Smoking status: Former    Current packs/day: 0.00    Types: Cigarettes    Quit date: 07/02/1980    Years since quitting: 43.3   Smokeless tobacco: Never  Vaping Use   Vaping status: Never Used  Substance and Sexual Activity   Alcohol use: Not Currently   Drug use: Never   Sexual activity: Not on file  Other Topics Concern   Not on file  Social History Narrative   Right handed   Wears prescription glasses    Drinks coffee daily    Social Drivers of Health   Financial Resource Strain: Low Risk  (11/06/2022)   [facility]   Overall Financial Resource Strain (CARDIA)    Difficulty of Paying Living Expenses: Not hard at all  Food Insecurity: Low Risk  (03/26/2023)   [facility]   Hunger Vital Sign    Worried About Running Out of Food in the Last Year: Never true    Ran Out of Food in the Last Year: Never true  Transportation Needs: Not on file (03/26/2023)  Physical Activity: Not on file  Stress: No Stress Concern Present (07/18/2022)   [facility] from [facility], [facility]   [employer] of Occupational Health - Occupational Stress Questionnaire    Feeling of Stress : Not at all  Social Connections: Unknown (02/24/2022)   Received from [facility], [facility]   Social Network    Social Network: Not on file     Review of Systems: A 12 point ROS discussed and pertinent positives are indicated in the HPI above.  All other systems are negative.   Vital Signs: BP (!) 152/84    Pulse 83   Temp 98 F (36.7 C) (Oral)   Resp 16   Ht 6' (1.829 m)   Wt 173 lb (78.5 kg)   SpO2 96%   BMI 23.46 kg/m   Advance Care Plan: The advanced care place/surrogate decision maker was discussed at the time of visit and the patient did not wish to discuss or was not able to name a surrogate decision maker or provide an advance care plan.  Physical Exam Constitutional:      Appearance: Normal appearance.  HENT:     Head: Normocephalic and  atraumatic.  Cardiovascular:     Rate and Rhythm: Normal rate and regular rhythm.  Pulmonary:     Effort: Pulmonary effort is normal. No respiratory distress.     Breath sounds: Normal breath sounds.  Abdominal:     Palpations: Abdomen is soft.  Skin:    General: Skin is warm and dry.  Neurological:     General: No focal deficit present.     Mental Status: He is alert and oriented to person, place, and time.  Psychiatric:        Mood and Affect: Mood normal.        Behavior: Behavior normal.        Thought Content: Thought content normal.        Judgment: Judgment normal.     Imaging: No results found.  Labs:  CBC: Recent Labs    03/12/23 1321 11/08/23 0630  WBC 11.3* 7.8  HGB 12.4* 11.7*  HCT 36.5* 34.4*  PLT 372 329    COAGS: Recent Labs    11/08/23 0630  INR 1.1    BMP: Recent Labs    03/12/23 1321 11/08/23 0630  NA 135 135  K 3.9 4.3  CL 99 99  CO2 26 26  GLUCOSE 116* 125*  BUN 23 14  CALCIUM 9.5 9.3  CREATININE 1.52* 1.43*  GFRNONAA 47* 50*    LIVER FUNCTION TESTS: Recent Labs    01/16/23 1108  PROT 7.7    TUMOR MARKERS: No results for input(s): "AFPTM", "CEA", "CA199", "CHROMGRNA" in the last 8760 hours.  Assessment and Plan: Left epididymo-orchitis and urinary retention.   Patient with past medical history of urinary retention presents with complaint of penile pain with foley catheter.  IR consulted for suprapubic catheter placement at the request of Dr. Annabell Howells. Despite attempt at  placement x2, he has been unable to tolerate with moderate sedation alone. Case now scheduled with anesthesia.  Patient presents today in their usual state of health.  He has been NPO and is not currently on blood thinners.   Risks and benefits of SP tube placement discussed with the patient including bleeding, infection, damage to bladder or adjacent structures, fistula connection, and sepsis.  All of the patient's questions were answered, patient is agreeable to proceed. Consent signed and in chart.  Thank you for allowing our service to participate in Steve Hunter 's care.  Electronically Signed: Hoyt Koch, PA   11/08/2023, 7:36 AM   I spent a total of  30 Minutes  in face to face in clinical consultation, greater than 50% of which was counseling/coordinating care for SP tube placement.

## 2023-11-08 NOTE — Progress Notes (Signed)
Patient ID: Steve Hunter, male   DOB: 15-Apr-1946, 78 y.o.   MRN: [id number] This is my office note from September for reference for his procedure today.  The order was placed at this visit and the patient failed to come for his appointment on 10/25/23.   I have symptoms of an enlarged prostate.  HPI: Steve Hunter is a 78 year-old male established patient who is here for symptoms of enlarged prostate.    07/19/23: Steve Hunter returns today in f/u. He had the foley changed 3 days ago and he had some hematuria and pain. The bleeding has cleared but he has chronic pain with the foley. He is interested in a suprapubic catheter.   05/08/23: Steve Hunter returns today in f/u. He failed a voiding trial and has the foley still in placed. He had a recent CT that showed a hypodense lesion off the LLP and [place] confirms a simple cyst.   04/09/2023: Steve Hunter underwent anterior cervical decompression from C3-C4 with Steve Hunter. His foley catheter was continued through that procedure from time of last exam with me. Urology consulted on 5/31 due to patient developing left epididymoorchitis confirmed on scrotal ultrasound. Empirically treated with antibiotics, discharged on Cipro for a total of 10 days based on culture sensitivity. Originally scheduled to come later this week for NV TOV but due to him having another doctor's appointment, he was put on my schedule this afternoon for reevaluation.   03/05/2023: Patient with below noted history. He was instructed on how to remove his Foley catheter this morning returning this afternoon for PVR. He was continued on tamsulosin and finasteride at time of last exam. Oxybutynin for bladder spasms was discontinued due to side effects of dry mouth and constipation. He was given samples of Gemtesa but told to hold the medication for at least 2 days prior to today's visit. No longer having any significant pain or discomfort in the left testicle. He notes some gradual improvement with swelling  to the affected area as well. He continues to tolerate indwelling Foley catheter without noted difficulty. Urine output appropriate, no painful or leaking urgency, gross hematuria. Since his cervical spine decompression, he is no longer having any numbness or tingling in the extremities. He is also ambulating much better.   Patient scheduled for surgery on his cervical spine after most recent MRI showed severe compression from C3-C5 at the end of this month. He will be able to have surgery on the lumbar spine until 3 months after the neck surgery. Today, he presents continue with the indwelling Foley. He never stopped Gemtesa but does note some improvement with the bladder spasms and less side effects compared to oxybutynin. He tell me he cannot bring himself to remove the Foley catheter. He has decided to continue with such until he has had his pending cervical spine procedure.   02/20/23: Steve Hunter returns today in f/u. He is having problems with his neck and back. He is supposed to have low back surgery and will probably need a cervical surgery as well. He is currently on oxybutyin for spasms. He last had the foley changed on 01/30/23. He remains on tamsulosin and finasteride. He has dry mouth and some constipation on oxybutynin.   01/02/2023: He returns today for repeat TOV. Summary from last exam as follows:   Small capacity bladder with DI and retention on UDS. Cystoscopy demonstrated a short prostate with a proximal channel from the Urolift and possibly some residual apical obstruction. He has a severely inflammed  bladder wall with erosive cystitis that was benign on prior biopsy.   The patient tells me today his orthopedist, Steve. Shon Hunter, is planning a spine surgery on him in the near future. That provider wanted to consult with Steve Hunter before proceeding. I do not see any notes in epic/Care Everywhere to corroborate but the patient states his wife has brought documentation with attention to Steve Hunter  for further review.   12/11/22: Steve Hunter returns today for cystoscopy. He had UDS and was found to have a small capacity unstable bladder with trabeculation reported but on my review there is a deep indentation on the right of uncertain etiology. He was unable to void and the foley was replaced. His culture on antibiotics on 11/30/22 was negative. 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   12/03/22: UDS SUMMARY  Steve Hunter held a max capacity of approx. 150 mls. His 1st sensation was felt at 82 mls. There was positive instability.These contractions were very uncomfortable for the patient and he asked me to stop test. There was no leakage from these contractions. He was asked to try and void off of them to see if this would relieve the discomfort but he was unable to. He did not generate a voluntary contraction and void. Trabeculation was noted. No reflux was seen. Spinal hardware was noted. An 18 Coude catheter was reinserted before the patient left the clinic. We did not have any of the 16 Coude catheters in stock. He will keep his UDS follow up.   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 He had a 27ml prostate on [place] on 04/25/22 and mild bilobar hyperplasia on cystoscopy. He was started on flomax and finasteride on 08/30/22 with a PVR of and trospium for nocturia on 10/09/22 but is not on that. His PVR on 10/23/22 was . He had been managed by Steve Hunter with [facility]. He was in the [facility] on 11/02/22 and was found to have a pseudomonal UTI. He was given Rocephin in the ER but sent out on keflex and I don't see that he was given a different med once the culture returned. He had another culture with his prior urologist and was started on Cipro on Monday and the pain has improved. He has  severe LUTS with an IPSS of 34. He has nocturia x 5. He has had no hematuria. He was reporting some lower extremity weakness in the ER. His last PSA was 0.13 in 12/22. He is a diabetic and has had lumbar spine surgery. 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     AUA Symptom Score: Almost always he has the sensation of not emptying his bladder completely when finished urinating. Almost always he has to urinate again fewer than two hours after he has finished urinating. Almost always he has to start and stop again several times when he urinates. More than 50% of the time he finds it difficult to postpone urination. Almost always he has a weak urinary stream. Almost always he has to push or strain to begin urination. He has to get up to urinate 5 or more times from the time he goes to bed until the time he gets up in the morning.  Calculated AUA Symptom Score: 34    QOL Score: He would feel terrible if he had to live with his urinary condition the way it is now for the rest of his life.   Calculated QOL Symptom Score: 6    IPSS: Calculated IPSS: 40    ALLERGIES: Lexicam    MEDICATIONS: Aspirin 81 mg tablet,chewable  Finasteride  Tamsulosin Hcl 0.4 mg capsule  Atorvastatin Calcium  Linzess 290 mcg capsule  Losartan Potassium 100 mg tablet  Meloxicam  Metformin Er Gastric  Miralax  Pantoprazole Sodium  Tramadol Hcl 50 mg tablet     GU PSH: Complex cystometrogram, w/ void pressure and urethral pressure profile studies, any technique - 12/03/2022 Complex Uroflow - 12/03/2022 Cystoscopy - 12/11/2022 Emg surf Electrd - 12/03/2022 Inject For cystogram - 12/03/2022 Intrabd voidng Press - 12/03/2022 UROLIFT - 07/18/2022       PSH Notes: Knee surgery   NON-GU PSH: Back Surgery (Unspecified) Neck Surgery Visit Complexity (formerly GPC1X) - 05/08/2023, 04/09/2023, 03/05/2023, 02/20/2023, 01/02/2023     GU PMH: Urinary Retention - 07/15/2023, - 06/11/2023, - 05/14/2023, - 04/17/2023,  - 04/09/2023, - 03/05/2023, - 02/20/2023, - 01/30/2023, - 01/02/2023, - 12/11/2022 BPH w/LUTS, The UDS was more consistent with a hypotonic unstable bladder and not outlet obstruction. On cystoscopy he had some apical coaptation but wasn't clear obstructed. I discussed a TURP as possibility but I think there is a low probability that it would allow him to void. I discussed CIC but he is reluctant to learn that. I discussed continued foley drainage or SP placement and he will stay with the monthly foley changes for now and will f/u in 6 months unless he changes his mind. - 05/08/2023, He will stay on the finasteride and tamsulosin and will return in 1-2 weeks. I have instructed him in how to remove the catheter and will have him do that early in the morning and then return for a PM appointment for a PVR. He will hold the [place] 2 days prior. , - 02/20/2023, - 01/02/2023, HE had a small capacity bladder with DI and retention on UDS. He didn't generate a voluntary contraction and the foley was replaced. He failed a voiding trial again today after cystoscopy which demonstrated a short prostate with a proximal channel from the Urolift and possibly some residual apical obstruction. I discussed continued foley drained, CIC or a TURP to see if we could open him up enough to void, but I fear he has more of a neurogenic bladder picture than obstruction. The foley was replaced and he will return for a voiding trial in 3-4 weeks. , - 12/11/2022, He has severe LUTS on finasteride and tamsulosin with increased symptoms following the urolift. He is currently being treated for pseudomal UTI and has had elevated PVR's. He has neuropathy and lumbar spine issues with lower extremity numbness and weakness and poor bladder contractility on prior UDS. 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 I will get him set up for Urodynamics and then f/u for cystoscopy. , - 11/30/2022 Detrusor overactivity -  05/08/2023, He has had less bladder spasms with Leslye Peer, - 03/05/2023, He has had some benefit from the oxybutynin but has dry mouth and constipation. I will change him to [facility] and gave him samples. , - 02/20/2023, - 01/02/2023, - 12/11/2022 Incomplete bladder emptying - 05/08/2023, - 12/03/2022, He has a PVR of . I am going to place a foley today and will try to get  him set up for urodynamics next week. , - 11/30/2022 Renal cyst, He had a left lower pole lesion on a non-contrast CT and it appears to be consistent with a simple cyst on [place]. The report was pending. - 05/08/2023 Epididymo-orchitis - 04/09/2023 Urinary Urgency - 02/20/2023, - 11/30/2022 Chronic cystitis (w/o hematuria), He has a severely inflammed bladder wall with erosive cystitis that was benign on prior biopsy. If he decided to go with the TURP, I would repeat the biopsy. - 12/11/2022 Acute Cystitis/UTI, He still has marked pyuria so I will reculture the urine. - 11/30/2022 Nocturia - 11/30/2022 Urinary Frequency - 11/30/2022 Weak Urinary Stream - 11/30/2022 History of bladder cancer    NON-GU PMH: Anxiety Arthritis Diabetes Type 2 GERD Hypercholesterolemia Hypertension Sleep Apnea    FAMILY HISTORY: Death In The Family Father - Other Death In The Family Mother - Other   SOCIAL HISTORY: Marital Status: Married Preferred Language: English; Ethnicity: Not Hispanic Or Latino; Race: White Current Smoking Status: Patient does not smoke anymore. Has not smoked since 11/22/1972. Smoked for 10 years. Smoked 1 pack per day.   Tobacco Use Assessment Completed: Used Tobacco in last 30 days? Does not use smokeless tobacco. Does not use drugs. Drinks 3 caffeinated drinks per day.    REVIEW OF SYSTEMS:    GU Review Male:   Patient denies frequent urination, hard to postpone urination, burning/ pain with urination, get up at night to urinate, leakage of urine, stream starts and stops, trouble starting your stream, have to strain to urinate ,  erection problems, and penile pain.  Gastrointestinal (Upper):   Patient denies nausea, vomiting, and indigestion/ heartburn.  Gastrointestinal (Lower):   Patient denies diarrhea and constipation.  Constitutional:   Patient denies fever, night sweats, weight loss, and fatigue.  Skin:   Patient denies skin rash/ lesion and itching.  Eyes:   Patient denies blurred vision and double vision.  Ears/ Nose/ Throat:   Patient denies sore throat and sinus problems.  Hematologic/Lymphatic:   Patient denies swollen glands and easy bruising.  Cardiovascular:   Patient denies leg swelling and chest pains.  Respiratory:   Patient denies cough and shortness of breath.  Endocrine:   Patient denies excessive thirst.  Musculoskeletal:   Patient denies back pain and joint pain.  Neurological:   Patient denies headaches and dizziness.  Psychologic:   Patient denies depression and anxiety.   VITAL SIGNS: None   Complexity of Data:  Records Review:   Previous Patient Records   PROCEDURES:          Visit Complexity - G2211 Chronic management   ASSESSMENT:      ICD-10 Details  1 GU:   BPH w/LUTS - N40.1 Chronic, Threat to Bodily Function - He has increased pain with the foley and has failed voiding trials. he would like to have a suprapubic tube and is not interested in CIC. I reviewed the risks and benefits and will get him scheduled with IR. l  2   Urinary Retention - R33.8 Chronic, Threat to Bodily Function  3   Suprapubic pain - R39.82 Chronic, Worsening - He is not getting relief with tramadol so I will send a few hydrocodone to tied him over until he has the SP tube placed.      PLAN:            Medications New Meds: Hydrocodone-Acetaminophen 5 mg-325 mg tablet 1 tablet PO Q 8 H PRN   #20  0 Refill(s)  Pharmacy Name:  [id number]  Address:  [address]   [place], [place] [id number]  Phone:  [phone]  Fax:  [phone]            Orders X-Rays:  Interventional Radiology Without I.V. Contrast - He would like to be converted to a suprapubic catheter.           Schedule Return Visit/Planned Activity: 3 Months - Office Visit             Note: SP tube change          Document Letter(s):  Created for Patient: Clinical Summary

## 2023-11-08 NOTE — Transfer of Care (Signed)
Immediate Anesthesia Transfer of Care Note  Patient: Steve Hunter  Procedure(s) Performed: CT GUIDED SUPRAPUBIC CATHETER PLACEMENT WITH ANESTHESIA  Patient Location: PACU  Anesthesia Type:MAC  Level of Consciousness: drowsy and patient cooperative  Airway & Oxygen Therapy: Patient Spontanous Breathing and Patient connected to nasal cannula oxygen  Post-op Assessment: Report given to RN, Post -op Vital signs reviewed and stable, and Patient moving all extremities X 4  Post vital signs: Reviewed and stable  Last Vitals:  Vitals Value Taken Time  BP 178/91 11/08/23 0920  Temp 36.7 C 11/08/23 0920  Pulse 94 11/08/23 0928  Resp 14 11/08/23 0928  SpO2 98 % 11/08/23 0928  Vitals shown include unfiled device data.  Last Pain:  Vitals:   11/08/23 0651  TempSrc:   PainSc: 7          Complications: No notable events documented.

## 2023-11-08 NOTE — Procedures (Signed)
Interventional Radiology Procedure Note  Procedure: Image guided supra-pubic catheter.  21F thal pigtail drain  Complications: None  EBL: None  Anesthesia: MAC with anesthesia team    Recommendations: - Routine supra-pubic catheter care - expect some (24-48 hours of hematuria/pink urine)  - ok to remove foley - will set up for image guided downsize of 21F thal to balloon retention catheter in ~6 weeks.  With moderate sedation only.  No GETA - DC when goals met in PACU, ~1hr  Signed,  Yvone Neu. Loreta Ave, DO

## 2023-11-11 ENCOUNTER — Encounter (HOSPITAL_COMMUNITY): Payer: Self-pay | Admitting: Radiology

## 2023-11-12 ENCOUNTER — Other Ambulatory Visit (HOSPITAL_COMMUNITY): Payer: Self-pay | Admitting: Interventional Radiology

## 2023-11-12 DIAGNOSIS — R339 Retention of urine, unspecified: Secondary | ICD-10-CM

## 2023-11-12 NOTE — Anesthesia Postprocedure Evaluation (Addendum)
Anesthesia Post Note  Patient: JUANYA STRAHLE  Procedure(s) Performed: CT GUIDED SUPRAPUBIC CATHETER PLACEMENT WITH ANESTHESIA     Patient location during evaluation: PACU Anesthesia Type: MAC Level of consciousness: awake and alert Pain management: pain level controlled Vital Signs Assessment: post-procedure vital signs reviewed and stable Respiratory status: spontaneous breathing, nonlabored ventilation and respiratory function stable Cardiovascular status: stable and blood pressure returned to baseline Postop Assessment: no apparent nausea or vomiting Anesthetic complications: no   No notable events documented.  Last Vitals:  Vitals:   11/08/23 0945 11/08/23 1000  BP: (!) 177/85 (!) 167/88  Pulse: 78 90  Resp: 13 16  Temp:  36.7 C  SpO2: 96% 95%    Last Pain:  Vitals:   11/08/23 0920  TempSrc:   PainSc: 5                  Ahaan Zobrist

## 2023-11-21 ENCOUNTER — Telehealth (HOSPITAL_COMMUNITY): Payer: Self-pay | Admitting: Student

## 2023-11-21 NOTE — Telephone Encounter (Signed)
Patient s/p SPT placement in IR 11/08/23. Patient called IR today stating there was bloody urine in his foley bag. Patient otherwise feels fine. He's emptied the foley bag. I advised the patient to monitor his urine output. If the urine begins to clear then no further action is needed. If he continues to have bloody urine output he needs to call his Urologist Dr. Annabell Howells. Patient verbalized understanding.  Alwyn Ren, AGACNP-BC 11/21/2023, 10:06 AM

## 2023-11-29 ENCOUNTER — Emergency Department (HOSPITAL_COMMUNITY)
Admission: EM | Admit: 2023-11-29 | Discharge: 2023-11-30 | Disposition: A | Discharge status: Home / Self Care | Payer: Medicare Other | Attending: Emergency Medicine | Admitting: Emergency Medicine

## 2023-11-29 ENCOUNTER — Emergency Department (HOSPITAL_COMMUNITY): Payer: Medicare Other

## 2023-11-29 ENCOUNTER — Other Ambulatory Visit: Payer: Self-pay

## 2023-11-29 ENCOUNTER — Encounter (HOSPITAL_COMMUNITY): Payer: Self-pay | Admitting: *Deleted

## 2023-11-29 DIAGNOSIS — Z7982 Long term (current) use of aspirin: Secondary | ICD-10-CM | POA: Diagnosis not present

## 2023-11-29 DIAGNOSIS — Z85820 Personal history of malignant melanoma of skin: Secondary | ICD-10-CM | POA: Diagnosis not present

## 2023-11-29 DIAGNOSIS — Z7984 Long term (current) use of oral hypoglycemic drugs: Secondary | ICD-10-CM | POA: Diagnosis not present

## 2023-11-29 DIAGNOSIS — Z8551 Personal history of malignant neoplasm of bladder: Secondary | ICD-10-CM | POA: Diagnosis not present

## 2023-11-29 DIAGNOSIS — E119 Type 2 diabetes mellitus without complications: Secondary | ICD-10-CM | POA: Insufficient documentation

## 2023-11-29 DIAGNOSIS — L03311 Cellulitis of abdominal wall: Secondary | ICD-10-CM | POA: Insufficient documentation

## 2023-11-29 DIAGNOSIS — I10 Essential (primary) hypertension: Secondary | ICD-10-CM | POA: Insufficient documentation

## 2023-11-29 DIAGNOSIS — L0390 Cellulitis, unspecified: Secondary | ICD-10-CM

## 2023-11-29 DIAGNOSIS — N390 Urinary tract infection, site not specified: Secondary | ICD-10-CM | POA: Insufficient documentation

## 2023-11-29 LAB — CBC WITH DIFFERENTIAL/PLATELET
Abs Immature Granulocytes: 0.05 10*3/uL (ref 0.00–0.07)
Basophils Absolute: 0.1 10*3/uL (ref 0.0–0.1)
Basophils Relative: 0 %
Eosinophils Absolute: 0.1 10*3/uL (ref 0.0–0.5)
Eosinophils Relative: 1 %
HCT: 30.6 % — ABNORMAL LOW (ref 39.0–52.0)
Hemoglobin: 10.2 g/dL — ABNORMAL LOW (ref 13.0–17.0)
Immature Granulocytes: 0 %
Lymphocytes Relative: 10 %
Lymphs Abs: 1.4 10*3/uL (ref 0.7–4.0)
MCH: 30.4 pg (ref 26.0–34.0)
MCHC: 33.3 g/dL (ref 30.0–36.0)
MCV: 91.3 fL (ref 80.0–100.0)
Monocytes Absolute: 1.4 10*3/uL — ABNORMAL HIGH (ref 0.1–1.0)
Monocytes Relative: 11 %
Neutro Abs: 10.1 10*3/uL — ABNORMAL HIGH (ref 1.7–7.7)
Neutrophils Relative %: 78 %
Platelets: 426 10*3/uL — ABNORMAL HIGH (ref 150–400)
RBC: 3.35 MIL/uL — ABNORMAL LOW (ref 4.22–5.81)
RDW: 13.3 % (ref 11.5–15.5)
WBC: 13.1 10*3/uL — ABNORMAL HIGH (ref 4.0–10.5)
nRBC: 0 % (ref 0.0–0.2)

## 2023-11-29 LAB — URINALYSIS, W/ REFLEX TO CULTURE (INFECTION SUSPECTED)
Bilirubin Urine: NEGATIVE
Glucose, UA: NEGATIVE mg/dL
Ketones, ur: NEGATIVE mg/dL
Nitrite: NEGATIVE
Protein, ur: 100 mg/dL — AB
Specific Gravity, Urine: 1.014 (ref 1.005–1.030)
WBC, UA: >50 WBC/hpf (ref 0–5)
pH: 7 (ref 5.0–8.0)

## 2023-11-29 LAB — COMPREHENSIVE METABOLIC PANEL
ALT: 12 U/L (ref 0–44)
AST: 14 U/L — ABNORMAL LOW (ref 15–41)
Albumin: 3.3 g/dL — ABNORMAL LOW (ref 3.5–5.0)
Alkaline Phosphatase: 57 U/L (ref 38–126)
Anion gap: 12 (ref 5–15)
BUN: 24 mg/dL — ABNORMAL HIGH (ref 8–23)
CO2: 25 mmol/L (ref 22–32)
Calcium: 9.4 mg/dL (ref 8.9–10.3)
Chloride: 95 mmol/L — ABNORMAL LOW (ref 98–111)
Creatinine, Ser: 1.72 mg/dL — ABNORMAL HIGH (ref 0.61–1.24)
GFR, Estimated: 40 mL/min — ABNORMAL LOW (ref >60)
Glucose, Bld: 180 mg/dL — ABNORMAL HIGH (ref 70–99)
Potassium: 4.8 mmol/L (ref 3.5–5.1)
Sodium: 132 mmol/L — ABNORMAL LOW (ref 135–145)
Total Bilirubin: 0.6 mg/dL (ref 0.0–1.2)
Total Protein: 7.6 g/dL (ref 6.5–8.1)

## 2023-11-29 LAB — LIPASE, BLOOD: Lipase: 31 U/L (ref 11–51)

## 2023-11-29 MED ORDER — SODIUM CHLORIDE 0.9 % IV BOLUS
1000.0000 mL | Freq: Once | INTRAVENOUS | Status: AC
  Administered 2023-11-29: 1000 mL via INTRAVENOUS

## 2023-11-29 MED ORDER — IOHEXOL 300 MG/ML  SOLN
75.0000 mL | Freq: Once | INTRAMUSCULAR | Status: AC | PRN
  Administered 2023-11-29: 75 mL via INTRAVENOUS

## 2023-11-29 MED ORDER — OXYCODONE-ACETAMINOPHEN 5-325 MG PO TABS: 1.0000 | ORAL_TABLET | Freq: Four times a day (QID) | ORAL | 0 refills | Status: DC | PRN

## 2023-11-29 MED ORDER — CEPHALEXIN 500 MG PO CAPS: 500.0000 mg | ORAL_CAPSULE | Freq: Four times a day (QID) | ORAL | 0 refills | Status: DC

## 2023-11-29 MED ORDER — HYDROMORPHONE HCL 1 MG/ML IJ SOLN
0.5000 mg | Freq: Once | INTRAMUSCULAR | Status: AC
  Administered 2023-11-29: 0.5 mg via INTRAVENOUS
  Filled 2023-11-29: qty 0.5

## 2023-11-29 NOTE — ED Triage Notes (Signed)
 Pt with urinary retention and weight loss of 60 lbs in less than a month. Emesis x 1 today.  Frequent diarrhea. Pt with supra pubic foley and is patent.

## 2023-11-30 NOTE — ED Provider Notes (Signed)
 [place] EMERGENCY DEPARTMENT AT [facility] Provider Note   CSN: [id number] Arrival date & time: 11/29/23  1734     History  Chief Complaint  Patient presents with   Urinary Retention    Steve Hunter is a 78 y.o. male.  HPI Patient with pain in his abdomen.  Had suprapubic catheter placed on the 17th of last month.  States he has had some chills.  States there is more drainage out of the catheter.  States he has lost weight recently but that has been prior to the catheter.  States there is no more drainage and pain.   Past Medical History:  Diagnosis Date   Anxiety disorder    Aortic stenosis    Mild   Cancer (HCC)    Bladder   Chronic back pain    Diabetes mellitus    Type II   GERD (gastroesophageal reflux disease)    History of hiatal hernia    Hypercholesterolemia    Hypertension    Melanoma (HCC)    Multiple thyroid  nodules    Osteoarthritis    Sleep apnea    pt states he no longer has this- he had surgery (tonsillectomy) and it resolved    Home Medications Prior to Admission medications   Medication Sig Start Date End Date Taking? Authorizing Provider  cephALEXin  (KEFLEX ) 500 MG capsule Take 1 capsule (500 mg total) by mouth 4 (four) times daily. 11/29/23  Yes Patsey Lot, MD  oxyCODONE -acetaminophen  (PERCOCET/ROXICET) 5-325 MG tablet Take 1-2 tablets by mouth every 6 (six) hours as needed for severe pain (pain score 7-10). 11/29/23  Yes Patsey Lot, MD  Accu-Chek Softclix Lancets lancets USE AS DIRECTED 2-3 TIMES DAILY 05/25/19   [provider]  aspirin EC 81 MG tablet Take 81 mg by mouth daily.    [provider]  atorvastatin  (LIPITOR) 40 MG tablet Take 40 mg by mouth daily. 04/15/19   [provider]  finasteride  (PROSCAR ) 5 MG tablet Take 5 mg by mouth daily. 07/17/19   [provider]  glucose blood (ACCU-CHEK AVIVA PLUS) test strip USE AS DIRECTED 2-3 TIMES DAILY 05/26/19   [provider]   linaclotide  (LINZESS ) 72 MCG capsule Take 72 mcg by mouth daily as needed (constipation). 11/29/22   [provider]  LORazepam  (ATIVAN ) 1 MG tablet Take 1 mg by mouth at bedtime.    [provider]  losartan  (COZAAR ) 50 MG tablet Take 50 mg by mouth daily. 04/15/19   [provider]  metFORMIN  (GLUCOPHAGE -XR) 500 MG 24 hr tablet Take 500 mg by mouth 2 (two) times daily with a meal. 09/30/19   [provider]  nortriptyline (PAMELOR) 25 MG capsule Take 25 mg by mouth at bedtime.    [provider]  ondansetron  (ZOFRAN ) 4 MG tablet Take 1 tablet (4 mg total) by mouth every 8 (eight) hours as needed for nausea or vomiting. 03/21/23   Burnetta Aures, MD  pantoprazole  (PROTONIX ) 40 MG tablet Take 40 mg by mouth 2 (two) times daily. 04/15/19   [provider]  Polyethyl Glycol-Propyl Glycol 0.4-0.3 % SOLN Place 1 drop into both eyes 3 (three) times daily as needed (dry eyes). systane eye drops    [provider]  tamsulosin  (FLOMAX ) 0.4 MG CAPS capsule Take 0.4 mg by mouth daily. 08/20/19   [provider]      Allergies    Lexiscan  [regadenoson ] and Vibegron    Review of Systems   Review  of Systems  Physical Exam Updated Vital Signs BP (!) 151/74   Pulse (!) 103   Temp 98.6 F (37 C) (Oral)   Resp 16   Ht 6' (1.829 m)   Wt 75.7 kg   SpO2 100%   BMI 22.62 kg/m  Physical Exam Vitals reviewed.  Genitourinary:    Comments: Suprapubic catheter in place.  Does have some discharge at the site.  Some lower abdominal tenderness also. Skin:    Capillary Refill: Capillary refill takes less than 2 seconds.  Neurological:     Mental Status: He is alert.     ED Results / Procedures / Treatments   Labs (all labs ordered are listed, but only abnormal results are displayed) Labs Reviewed  CBC WITH DIFFERENTIAL/PLATELET - Abnormal; Notable for the following components:      Result Value   WBC 13.1 (*)    RBC 3.35 (*)     Hemoglobin 10.2 (*)    HCT 30.6 (*)    Platelets 426 (*)    Neutro Abs 10.1 (*)    Monocytes Absolute 1.4 (*)    All other components within normal limits  COMPREHENSIVE METABOLIC PANEL - Abnormal; Notable for the following components:   Sodium 132 (*)    Chloride 95 (*)    Glucose, Bld 180 (*)    BUN 24 (*)    Creatinine, Ser 1.72 (*)    Albumin  3.3 (*)    AST 14 (*)    GFR, Estimated 40 (*)    All other components within normal limits  URINALYSIS, W/ REFLEX TO CULTURE (INFECTION SUSPECTED) - Abnormal; Notable for the following components:   APPearance TURBID (*)    Hgb urine dipstick MODERATE (*)    Protein, ur 100 (*)    Leukocytes,Ua LARGE (*)    Bacteria, UA FEW (*)    All other components within normal limits  URINE CULTURE  LIPASE, BLOOD    EKG None  Radiology CT ABDOMEN PELVIS W CONTRAST Result Date: 11/29/2023 CLINICAL DATA:  Pt with urinary retention and weight loss of 60 lbs in less than a month. Emesis x 1 today. Frequent diarrhea. Pt with supra pubic foley and is patent EXAM: CT ABDOMEN AND PELVIS WITH CONTRAST TECHNIQUE: Multidetector CT imaging of the abdomen and pelvis was performed using the standard protocol following bolus administration of intravenous contrast. RADIATION DOSE REDUCTION: This exam was performed according to the departmental dose-optimization program which includes automated exposure control, adjustment of the mA and/or kV according to patient size and/or use of iterative reconstruction technique. CONTRAST:  75mL OMNIPAQUE  IOHEXOL  300 MG/ML  SOLN COMPARISON:  None Available. FINDINGS: Lower chest: No acute abnormality. Atherosclerotic plaque and aortic valve leaflet calcification. Hepatobiliary: No focal liver abnormality. No gallstones, gallbladder wall thickening, or pericholecystic fluid. No biliary dilatation. Pancreas: No focal lesion. Normal pancreatic contour. No surrounding inflammatory changes. No main pancreatic ductal dilatation. Spleen:  Normal in size without focal abnormality. Adrenals/Urinary Tract: No adrenal nodule bilaterally. Bilateral kidneys enhance symmetrically. Bilateral urothelial thickening. Nonspecific perinephric fat stranding. Mild fullness of left collecting system with no frank hydroureteronephrosis. Interval development of mild right hydroureteronephrosis. Suprapubic catheter terminates within the urinary bladder lumen. The urinary bladder is decompressed. Fluid dense lesion within right kidney likely represents a simple renal cyst. Simple renal cysts, in the absence of clinically indicated signs/symptoms, require no independent follow-up. On delayed imaging, there is no urothelial wall thickening and there are no filling defects in the opacified portions of the bilateral  collecting systems or ureters. Stomach/Bowel: Stomach is within normal limits. No evidence of bowel wall thickening or dilatation. Colonic diverticulosis. Appendix appears normal. Vascular/Lymphatic: No abdominal aorta or iliac aneurysm. Severe atherosclerotic plaque of the aorta and its branches. No abdominal, pelvic, or inguinal lymphadenopathy. 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 Other: No intraperitoneal free fluid. No intraperitoneal free gas. No organized fluid collection. Musculoskeletal: No abdominal wall hernia or abnormality. No suspicious lytic or blastic osseous lesions. No acute displaced fracture. Lower lumbar spine severe degenerative changes. Status post L4-S1 posterolateral and interbody surgical hardware fusion. L4-L5 laminectomy. Stable periprosthetic lucency along the L4 screws. Grade 1 anterolisthesis of L4 on L5. Mild retrolisthesis of L5 on S1. Bilateral L4 pars interarticularis defects. Mild retrolisthesis of L2 on L3 and L3 on L4. IMPRESSION: 1. Suprapubic catheter in appropriate position. Bilateral urothelial thickening- correlate for superimposed infection. 2. Interval development of mild right  hydroureteronephrosis. No nephroureterolithiasis. This may reflect the residual of a recently passed calculus or reflect chronic changes of obstructive uropathy or reflux. 3. Prostate normal in size with radiation seeds. 4. Colonic diverticulosis with no acute diverticulitis. 5. Aortic Atherosclerosis (ICD10-I70.0) including aortic valve leaflet calcifications-correlate for aortic stenosis. Electronically Signed   By: Morgane  Naveau M.D.   On: 11/29/2023 21:57    Procedures Procedures    Medications Ordered in ED Medications  HYDROmorphone  (DILAUDID ) injection 0.5 mg (0.5 mg Intravenous Given 11/29/23 2124)  sodium chloride  0.9 % bolus 1,000 mL (0 mLs Intravenous Stopped 11/29/23 2345)  iohexol  (OMNIPAQUE ) 300 MG/ML solution 75 mL (75 mLs Intravenous Contrast Given 11/29/23 2134)    ED Course/ Medical Decision Making/ A&P                                 Medical Decision Making Amount and/or Complexity of Data Reviewed Labs: ordered. Radiology: ordered.  Risk Prescription drug management.   Patient with abdominal pain with relatively recent suprapubic catheter placement.  Differential diagnose include misplaced catheter, abscess, infection.  Does have potential infection at the site.  CT scan done and shows a hydronephrosis on the right.  Discussed with Dr. Patrcia from urology who reviewed the images.  May need some adjustment of the site as an outpatient but does not need acute intervention.  Will give antibiotics to cover both potential UTI and skin infection.  Follow-up with Dr. Watt.        Final Clinical Impression(s) / ED Diagnoses Final diagnoses:  Cellulitis, unspecified cellulitis site  Urinary tract infection associated with cystostomy catheter, initial encounter [facility])    Rx / DC Orders ED Discharge Orders          Ordered    cephALEXin  (KEFLEX ) 500 MG capsule  4 times daily        11/29/23 2326    oxyCODONE -acetaminophen  (PERCOCET/ROXICET) 5-325 MG tablet  Every  6 hours PRN        11/29/23 2326              Patsey Lot, MD 11/30/23 [phone]

## 2023-12-01 ENCOUNTER — Other Ambulatory Visit: Payer: Self-pay

## 2023-12-01 MED ORDER — OXYBUTYNIN CHLORIDE ER 5 MG PO TB24: 5.0000 mg | ORAL_TABLET | Freq: Three times a day (TID) | ORAL | 2 refills | Status: AC | PRN

## 2023-12-02 ENCOUNTER — Telehealth: Payer: Self-pay

## 2023-12-02 NOTE — Telephone Encounter (Signed)
 This patient was seen at the Emergency Dept on 2/7 for issues with his suprapubic catheter placed on 1/17. Complaints of discharge around the site and lower abdominal pain Dr. Inga Manges is his provider. I wanted to see if you would like Dr. Inga Manges to see him or have him come in for a nurse visit.

## 2023-12-03 LAB — URINE CULTURE: Culture: >=100000 — AB

## 2023-12-04 ENCOUNTER — Telehealth (HOSPITAL_BASED_OUTPATIENT_CLINIC_OR_DEPARTMENT_OTHER): Payer: Self-pay

## 2023-12-04 NOTE — Telephone Encounter (Signed)
Post ED Visit - Positive Culture Follow-up: Successful Patient Follow-Up  Culture assessed and recommendations reviewed by:  []  Enzo Bi, Pharm.D. []  Celedonio Miyamoto, Pharm.D., BCPS AQ-ID []  Garvin Fila, Pharm.D., BCPS []  Georgina Pillion, [address].D., BCPS []  [place], [address].D., BCPS, AAHIVP []  Estella Husk, Pharm.D., BCPS, AAHIVP []  Lysle Pearl, PharmD, BCPS []  Phillips Climes, PharmD, BCPS []  Agapito Games, PharmD, BCPS [x]  Ivery Quale, PharmD  Positive urine culture  []  Patient discharged without antimicrobial prescription and treatment is now indicated [x]  Organism is resistant to prescribed ED discharge antimicrobial []  Patient with positive blood cultures  Changes discussed with ED provider: Riki Sheer, PA New antibiotic prescription Cipro 500 mg po daily x 3 days.  Called to [facility] in [place]  Contacted patient, date 12/04/2023, time 9:40 am   Sandria Senter 12/04/2023, 9:42 AM

## 2023-12-17 ENCOUNTER — Other Ambulatory Visit: Payer: Self-pay | Admitting: Student

## 2023-12-17 NOTE — H&P (Signed)
 Chief Complaint: Patient was seen in consultation today for urinary retention; suprapubic catheter exchange.   Referring Physician(s): Dr. Annabell Howells  Supervising Physician: Gilmer Mor  Patient Status: [facility] - Out-pt  History of Present Illness: Steve Hunter is a 78 y.o. male with a medical history significant for melanoma, HTN, DM, chronic back pain s/p C2-C3 fusion and left epididymo-orchitis with urinary retention. He had a foley catheter but experienced penile pain and he requested to be converted to a suprapubic catheter. He received a 20 F thal pigtail drain 11/08/23. The procedure was performed under MAC with general anesthesia. He was evaluated in the ED 11/30/23 with pain/tenderness/drainage at [facility] site. He was treated for UTI and cellulitis.   The patient presents today for an image-guided down-size to a balloon retention catheter. This procedure will performed under moderate sedation.   Past Medical History:  Diagnosis Date   Anxiety disorder    Aortic stenosis    Mild   Cancer (HCC)    Bladder   Chronic back pain    Diabetes mellitus    Type II   GERD (gastroesophageal reflux disease)    History of hiatal hernia    Hypercholesterolemia    Hypertension    Melanoma (HCC)    Multiple thyroid nodules    Osteoarthritis    Sleep apnea    pt states he no longer has this- he had surgery (tonsillectomy) and it resolved    Past Surgical History:  Procedure Laterality Date   ANTERIOR CERVICAL DECOMP/DISCECTOMY FUSION N/A 03/21/2023   Procedure: ANTERIOR CERVICAL DECOMPRESSION/DISCECTOMY FUSION 1 LEVEL C3-4;  Surgeon: Venita Lick, MD;  Location: [facility] OR;  Service: Orthopedics;  Laterality: N/A;   BACK SURGERY     BLADDER SURGERY     carpal tunnel surgery     CATARACT EXTRACTION W/ INTRAOCULAR LENS IMPLANT Bilateral    5 years ago   CYSTOSCOPY WITH INSERTION OF UROLIFT  07/18/2022   MELANOMA EXCISION     RADIOLOGY WITH ANESTHESIA N/A 11/08/2023   Procedure: CT GUIDED  SUPRAPUBIC CATHETER PLACEMENT WITH ANESTHESIA;  Surgeon: Radiologist, Medication, MD;  Location: [facility] OR;  Service: Radiology;  Laterality: N/A;   SHOULDER SURGERY Left    TONSILLECTOMY     TOTAL KNEE ARTHROPLASTY     left   WRIST SURGERY     Right wrist    Allergies: Lexiscan [regadenoson] and Vibegron  Medications: Prior to Admission medications   Medication Sig Start Date End Date Taking? Authorizing Provider  Accu-Chek Softclix Lancets lancets USE AS DIRECTED 2-3 TIMES DAILY 05/25/19   [provider]  aspirin EC 81 MG tablet Take 81 mg by mouth daily.    [provider]  atorvastatin (LIPITOR) 40 MG tablet Take 40 mg by mouth daily. 04/15/19   [provider]  cephALEXin (KEFLEX) 500 MG capsule Take 1 capsule (500 mg total) by mouth 4 (four) times daily. 11/29/23   Benjiman Core, MD  finasteride (PROSCAR) 5 MG tablet Take 5 mg by mouth daily. 07/17/19   [provider]  glucose blood (ACCU-CHEK AVIVA PLUS) test strip USE AS DIRECTED 2-3 TIMES DAILY 05/26/19   [provider]  linaclotide (LINZESS) 72 MCG capsule Take 72 mcg by mouth daily as needed (constipation). 11/29/22   [provider]  LORazepam (ATIVAN) 1 MG tablet Take 1 mg by mouth at bedtime.    [provider]  losartan (COZAAR) 50 MG tablet Take 50 mg by mouth daily. 04/15/19   [provider]  metFORMIN (GLUCOPHAGE-XR) 500 MG 24 hr tablet Take 500 mg by mouth 2 (two) times daily with a meal. 09/30/19   [provider]  nortriptyline (PAMELOR) 25 MG capsule Take 25 mg by mouth at bedtime.    [provider]  ondansetron (ZOFRAN) 4 MG tablet Take 1 tablet (4 mg total) by mouth every 8 (eight) hours as needed for nausea or vomiting. 03/21/23   Venita Lick, MD  oxybutynin (DITROPAN XL) 5 MG 24 hr tablet Take 1 tablet (5 mg total) by mouth 3 (three) times daily as needed. 12/01/23 12/31/23  Zettie Pho, MD  oxyCODONE-acetaminophen  (PERCOCET/ROXICET) 5-325 MG tablet Take 1-2 tablets by mouth every 6 (six) hours as needed for severe pain (pain score 7-10). 11/29/23   Benjiman Core, MD  pantoprazole (PROTONIX) 40 MG tablet Take 40 mg by mouth 2 (two) times daily. 04/15/19   [provider]  Polyethyl Glycol-Propyl Glycol 0.4-0.3 % SOLN Place 1 drop into both eyes 3 (three) times daily as needed (dry eyes). systane eye drops    [provider]  tamsulosin (FLOMAX) 0.4 MG CAPS capsule Take 0.4 mg by mouth daily. 08/20/19   [provider]     Family History  Problem Relation Age of Onset   Hypertension Mother    Lung cancer Father     Social History   Socioeconomic History   Marital status: Married    Spouse name: Not on file   Number of children: 2   Years of education: Not on file   Highest education level: Not on file  Occupational History   Occupation: retired Scientist, water quality: RETIRED  Tobacco Use   Smoking status: Former    Current packs/day: 0.00    Types: Cigarettes    Quit date: 07/02/1980    Years since quitting: 43.4   Smokeless tobacco: Never  Vaping Use   Vaping status: Never Used  Substance and Sexual Activity   Alcohol use: Not Currently   Drug use: Never   Sexual activity: Not on file  Other Topics Concern   Not on file  Social History Narrative   Right handed   Wears prescription glasses    Drinks coffee daily    Social Drivers of Health   Financial Resource Strain: Low Risk  (11/06/2022)   Received [facility]   Overall Financial Resource Strain (CARDIA)    Difficulty of Paying Living Expenses: Not hard at all  Food Insecurity: Low Risk  (03/26/2023)   [facility]   Hunger Vital Sign    Worried About Running Out of Food in the Last Year: Never true    Ran Out of Food in the Last Year: Never true  Transportation Needs: Not on file (03/26/2023)  Physical Activity: Not on file  Stress: No Stress  Concern Present (07/18/2022)   Received from [facility], [facility]   [employer] of Occupational Health - Occupational Stress Questionnaire    Feeling of Stress : Not at all  Social Connections: Unknown (02/24/2022)   Received from [facility], [facility]   Social Network    Social Network: Not on file    Review of Systems: A 12 point ROS discussed and pertinent positives are indicated in the HPI above.  All other systems are negative.  Review of Systems  Constitutional:  Negative for appetite change and fatigue.  Respiratory:  Negative for cough and shortness of breath.   Cardiovascular:  Negative  for chest pain and leg swelling.  Gastrointestinal:  Negative for abdominal pain, diarrhea, nausea and vomiting.  Genitourinary:  Positive for difficulty urinating.       SPT site with dried drainage and mild erythema. Site is tender.   Musculoskeletal:  Positive for back pain.  Neurological:  Negative for dizziness and headaches.    Vital Signs: BP 135/73   Pulse 78   Temp 98.2 F (36.8 C) (Oral)   Resp 19   Ht 6\' 1"  (1.854 m)   Wt 163 lb 12.8 oz (74.3 kg)   SpO2 97%   BMI 21.61 kg/m   Physical Exam Constitutional:      General: He is not in acute distress.    Appearance: He is not ill-appearing.  HENT:     Mouth/Throat:     Mouth: Mucous membranes are moist.     Pharynx: Oropharynx is clear.  Cardiovascular:     Rate and Rhythm: Normal rate.  Pulmonary:     Effort: Pulmonary effort is normal.  Genitourinary:    Comments: SPT to gravity bag. SPT site with old drainage and mild erythema. Site is tender. Suture intact.  Skin:    General: Skin is warm and dry.  Neurological:     Mental Status: He is alert and oriented to person, place, and time.  Psychiatric:        Mood and Affect: Mood normal.        Behavior: Behavior normal.        Thought Content: Thought content normal.        Judgment: Judgment normal.     Imaging: CT ABDOMEN PELVIS W  CONTRAST Result Date: 11/29/2023 CLINICAL DATA:  Pt with urinary retention and weight loss of 60 lbs in less than a month. Emesis x 1 today. Frequent diarrhea. Pt with supra pubic foley and is patent EXAM: CT ABDOMEN AND PELVIS WITH CONTRAST TECHNIQUE: Multidetector CT imaging of the abdomen and pelvis was performed using the standard protocol following bolus administration of intravenous contrast. RADIATION DOSE REDUCTION: This exam was performed according to the departmental dose-optimization program which includes automated exposure control, adjustment of the mA and/or kV according to patient size and/or use of iterative reconstruction technique. CONTRAST:  75mL OMNIPAQUE IOHEXOL 300 MG/ML  SOLN COMPARISON:  None Available. FINDINGS: Lower chest: No acute abnormality. Atherosclerotic plaque and aortic valve leaflet calcification. Hepatobiliary: No focal liver abnormality. No gallstones, gallbladder wall thickening, or pericholecystic fluid. No biliary dilatation. Pancreas: No focal lesion. Normal pancreatic contour. No surrounding inflammatory changes. No main pancreatic ductal dilatation. Spleen: Normal in size without focal abnormality. Adrenals/Urinary Tract: No adrenal nodule bilaterally. Bilateral kidneys enhance symmetrically. Bilateral urothelial thickening. Nonspecific perinephric fat stranding. Mild fullness of left collecting system with no frank hydroureteronephrosis. Interval development of mild right hydroureteronephrosis. Suprapubic catheter terminates within the urinary bladder lumen. The urinary bladder is decompressed. Fluid dense lesion within right kidney likely represents a simple renal cyst. Simple renal cysts, in the absence of clinically indicated signs/symptoms, require no independent follow-up. On delayed imaging, there is no urothelial wall thickening and there are no filling defects in the opacified portions of the bilateral collecting systems or ureters. Stomach/Bowel: Stomach is  within normal limits. No evidence of bowel wall thickening or dilatation. Colonic diverticulosis. Appendix appears normal. Vascular/Lymphatic: No abdominal aorta or iliac aneurysm. Severe atherosclerotic plaque of the aorta and its branches. No abdominal, pelvic, or inguinal lymphadenopathy. 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 Other: No intraperitoneal free  fluid. No intraperitoneal free gas. No organized fluid collection. Musculoskeletal: No abdominal wall hernia or abnormality. No suspicious lytic or blastic osseous lesions. No acute displaced fracture. Lower lumbar spine severe degenerative changes. Status post L4-S1 posterolateral and interbody surgical hardware fusion. L4-L5 laminectomy. Stable periprosthetic lucency along the L4 screws. Grade 1 anterolisthesis of L4 on L5. Mild retrolisthesis of L5 on S1. Bilateral L4 pars interarticularis defects. Mild retrolisthesis of L2 on L3 and L3 on L4. IMPRESSION: 1. Suprapubic catheter in appropriate position. Bilateral urothelial thickening- correlate for superimposed infection. 2. Interval development of mild right hydroureteronephrosis. No nephroureterolithiasis. This may reflect the residual of a recently passed calculus or reflect chronic changes of obstructive uropathy or reflux. 3. Prostate normal in size with radiation seeds. 4. Colonic diverticulosis with no acute diverticulitis. 5. Aortic Atherosclerosis (ICD10-I70.0) including aortic valve leaflet calcifications-correlate for aortic stenosis. Electronically Signed   By: Tish Frederickson M.D.   On: 11/29/2023 21:57    Labs:  CBC: Recent Labs    03/12/23 1321 11/08/23 0630 11/29/23 1820  WBC 11.3* 7.8 13.1*  HGB 12.4* 11.7* 10.2*  HCT 36.5* 34.4* 30.6*  PLT 372 329 426*    COAGS: Recent Labs    11/08/23 0630  INR 1.1    BMP: Recent Labs    03/12/23 1321 11/08/23 0630 11/29/23 1820  NA 135 135 132*  K 3.9 4.3 4.8  CL 99 99 95*  CO2 26 26 25    GLUCOSE 116* 125* 180*  BUN 23 14 24*  CALCIUM 9.5 9.3 9.4  CREATININE 1.52* 1.43* 1.72*  GFRNONAA 47* 50* 40*    LIVER FUNCTION TESTS: Recent Labs    01/16/23 1108 11/29/23 1820  BILITOT  --  0.6  AST  --  14*  ALT  --  12  ALKPHOS  --  57  PROT 7.7 7.6  ALBUMIN  --  3.3*    TUMOR MARKERS: No results for input(s): "AFPTM", "CEA", "CA199", "CHROMGRNA" in the last 8760 hours.  Assessment and Plan:  Urinary retention managed by suprapubic catheter: Steve Hunter, 78 year old male, presents today to the [facility] Interventional Radiology department for an image-guided suprapubic catheter exchange.   Risks and benefits discussed with the patient including bleeding, infection, damage to adjacent structures, bowel perforation/fistula connection, and sepsis.  All of the patient's questions were answered, patient is agreeable to proceed. He has been NPO.   Consent signed and in chart.  Thank you for this interesting consult.  I greatly enjoyed meeting Steve Hunter and look forward to participating in their care.  A copy of this report was sent to the requesting provider on this date.  Electronically Signed: Alwyn Ren, AGACNP-BC 12/18/2023, 8:26 AM   I spent a total of  30 Minutes   in face to face in clinical consultation, greater than 50% of which was counseling/coordinating care for urinary retention.

## 2023-12-18 ENCOUNTER — Ambulatory Visit (HOSPITAL_COMMUNITY)
Admission: RE | Admit: 2023-12-18 | Discharge: 2023-12-18 | Disposition: A | Discharge status: Home / Self Care | Payer: Medicare Other | Source: Ambulatory Visit | Attending: Interventional Radiology

## 2023-12-18 ENCOUNTER — Other Ambulatory Visit: Payer: Self-pay

## 2023-12-18 DIAGNOSIS — G8929 Other chronic pain: Secondary | ICD-10-CM | POA: Insufficient documentation

## 2023-12-18 DIAGNOSIS — R339 Retention of urine, unspecified: Secondary | ICD-10-CM | POA: Insufficient documentation

## 2023-12-18 DIAGNOSIS — Z87891 Personal history of nicotine dependence: Secondary | ICD-10-CM | POA: Diagnosis not present

## 2023-12-18 DIAGNOSIS — Z435 Encounter for attention to cystostomy: Secondary | ICD-10-CM | POA: Diagnosis present

## 2023-12-18 DIAGNOSIS — Z85820 Personal history of malignant melanoma of skin: Secondary | ICD-10-CM | POA: Diagnosis not present

## 2023-12-18 DIAGNOSIS — Z7984 Long term (current) use of oral hypoglycemic drugs: Secondary | ICD-10-CM | POA: Insufficient documentation

## 2023-12-18 DIAGNOSIS — Z79899 Other long term (current) drug therapy: Secondary | ICD-10-CM | POA: Diagnosis not present

## 2023-12-18 DIAGNOSIS — E119 Type 2 diabetes mellitus without complications: Secondary | ICD-10-CM | POA: Diagnosis not present

## 2023-12-18 DIAGNOSIS — N1330 Unspecified hydronephrosis: Secondary | ICD-10-CM | POA: Diagnosis not present

## 2023-12-18 DIAGNOSIS — I10 Essential (primary) hypertension: Secondary | ICD-10-CM | POA: Diagnosis not present

## 2023-12-18 HISTORY — PX: IR CATHETER TUBE CHANGE: IMG717

## 2023-12-18 LAB — GLUCOSE, CAPILLARY: Glucose-Capillary: 120 mg/dL — ABNORMAL HIGH (ref 70–99)

## 2023-12-18 MED ORDER — MIDAZOLAM HCL 2 MG/2ML IJ SOLN
INTRAMUSCULAR | Status: AC
  Filled 2023-12-18: qty 4

## 2023-12-18 MED ORDER — LIDOCAINE VISCOUS HCL 2 % MT SOLN
OROMUCOSAL | Status: AC
  Filled 2023-12-18: qty 15

## 2023-12-18 MED ORDER — FENTANYL CITRATE (PF) 100 MCG/2ML IJ SOLN
INTRAMUSCULAR | Status: AC
  Filled 2023-12-18: qty 4

## 2023-12-18 MED ORDER — FENTANYL CITRATE (PF) 100 MCG/2ML IJ SOLN
INTRAMUSCULAR | Status: AC | PRN
Start: 2023-12-18 — End: 2023-12-18
  Administered 2023-12-18 (×2): 50 ug via INTRAVENOUS

## 2023-12-18 MED ORDER — MIDAZOLAM HCL 2 MG/2ML IJ SOLN
INTRAMUSCULAR | Status: AC | PRN
  Administered 2023-12-18 (×2): 1 mg via INTRAVENOUS

## 2023-12-18 MED ORDER — LIDOCAINE-EPINEPHRINE 1 %-1:100000 IJ SOLN
INTRAMUSCULAR | Status: AC
  Filled 2023-12-18: qty 1

## 2023-12-18 MED ORDER — IOHEXOL 300 MG/ML  SOLN
50.0000 mL | Freq: Once | INTRAMUSCULAR | Status: AC | PRN
Start: 2023-12-18 — End: 2023-12-18
  Administered 2023-12-18: 15 mL

## 2023-12-20 ENCOUNTER — Ambulatory Visit (HOSPITAL_COMMUNITY): Payer: Medicare Other

## 2024-01-02 NOTE — Progress Notes (Deleted)
 Steve Hunter Date of Birth: 1946-01-13   History of Present Illness: Mr. Obryan is seen today for pre op evaluation. He has a history of very mild aortic stenosis. His last echocardiogram was in September 2016. He has a  LexiScan Myoview study in 2013 which was normal. He did have an allergic reaction following this study with a diffuse rash, presumably from the LexiScan. He has a history of HTN, DM, and HL. In June 2017 he had L4-S1 fusion.   More recently he has worsening back pain with neuropathy. Seeing Dr Shon Baton. 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 Has indwelling foley. Now seeing Dr Annabell Howells. He was converted to suprapubic catheter in January. He did undergo cervical spine surgery last May  He denies any chest pain, dyspnea, edema, or palpitations. No dizziness.   Current Outpatient Medications on File Prior to Visit  Medication Sig Dispense Refill   Accu-Chek Softclix Lancets lancets USE AS DIRECTED 2-3 TIMES DAILY     aspirin EC 81 MG tablet Take 81 mg by mouth daily.     atorvastatin (LIPITOR) 40 MG tablet Take 40 mg by mouth daily.     cephALEXin (KEFLEX) 500 MG capsule Take 1 capsule (500 mg total) by mouth 4 (four) times daily. 28 capsule 0   finasteride (PROSCAR) 5 MG tablet Take 5 mg by mouth daily.     glucose blood (ACCU-CHEK AVIVA PLUS) test strip USE AS DIRECTED 2-3 TIMES DAILY     linaclotide (LINZESS) 72 MCG capsule Take 72 mcg by mouth daily as needed (constipation).     LORazepam (ATIVAN) 1 MG tablet Take 1 mg by mouth at bedtime.     losartan (COZAAR) 50 MG tablet Take 50 mg by mouth daily.     metFORMIN (GLUCOPHAGE-XR) 500 MG 24 hr tablet Take 500 mg by mouth 2 (two) times daily with a meal.     nortriptyline (PAMELOR) 25 MG capsule Take 25 mg by mouth at bedtime.     ondansetron (ZOFRAN) 4 MG tablet Take 1 tablet (4 mg total) by mouth every 8 (eight) hours as needed for nausea or vomiting. 20 tablet 0   oxyCODONE-acetaminophen  (PERCOCET/ROXICET) 5-325 MG tablet Take 1-2 tablets by mouth every 6 (six) hours as needed for severe pain (pain score 7-10). 6 tablet 0   pantoprazole (PROTONIX) 40 MG tablet Take 40 mg by mouth 2 (two) times daily.     Polyethyl Glycol-Propyl Glycol 0.4-0.3 % SOLN Place 1 drop into both eyes 3 (three) times daily as needed (dry eyes). systane eye drops     tamsulosin (FLOMAX) 0.4 MG CAPS capsule Take 0.4 mg by mouth daily.     No current facility-administered medications on file prior to visit.    Allergies  Allergen Reactions   Lexiscan [Regadenoson] Itching and Rash    Patient developed mosquite bite type rash on chest, back and leg post lexiscan.   Vibegron     Other Reaction(s): Other (See Comments)  Dry mouth, urinary retention    Past Medical History:  Diagnosis Date   Anxiety disorder    Aortic stenosis    Mild   Cancer (HCC)    Bladder   Chronic back pain    Diabetes mellitus    Type II   GERD (gastroesophageal reflux disease)    History of hiatal hernia    Hypercholesterolemia    Hypertension    Melanoma (HCC)    Multiple thyroid nodules  Osteoarthritis    Sleep apnea    pt states he no longer has this- he had surgery (tonsillectomy) and it resolved    Past Surgical History:  Procedure Laterality Date   ANTERIOR CERVICAL DECOMP/DISCECTOMY FUSION N/A 03/21/2023   Procedure: ANTERIOR CERVICAL DECOMPRESSION/DISCECTOMY FUSION 1 LEVEL C3-4;  Surgeon: Venita Lick, MD;  Location: [facility] OR;  Service: Orthopedics;  Laterality: N/A;   BACK SURGERY     BLADDER SURGERY     carpal tunnel surgery     CATARACT EXTRACTION W/ INTRAOCULAR LENS IMPLANT Bilateral    5 years ago   CYSTOSCOPY WITH INSERTION OF UROLIFT  07/18/2022   IR CATHETER TUBE CHANGE  12/18/2023   MELANOMA EXCISION     RADIOLOGY WITH ANESTHESIA N/A 11/08/2023   Procedure: CT GUIDED SUPRAPUBIC CATHETER PLACEMENT WITH ANESTHESIA;  Surgeon: Radiologist, Medication, MD;  Location: [facility] OR;  Service: Radiology;   Laterality: N/A;   SHOULDER SURGERY Left    TONSILLECTOMY     TOTAL KNEE ARTHROPLASTY     left   WRIST SURGERY     Right wrist    Social History   Tobacco Use  Smoking Status Former   Current packs/day: 0.00   Types: Cigarettes   Quit date: 07/02/1980   Years since quitting: 43.5  Smokeless Tobacco Never    Social History   Substance and Sexual Activity  Alcohol Use Not Currently    Family History  Problem Relation Age of Onset   Hypertension Mother    Lung cancer Father     Review of Systems: As noted in HPI.  All other systems were reviewed and are negative.  Physical Exam: There were no vitals taken for this visit. GENERAL:  Well appearing WM in NAD HEENT:  PERRL, EOMI, sclera are clear. Oropharynx is clear. NECK:  No jugular venous distention, carotid upstroke brisk and symmetric, no bruits, no thyromegaly or adenopathy LUNGS:  Clear to auscultation bilaterally CHEST:  Unremarkable HEART:  RRR,  PMI not displaced or sustained,S1 and S2 within normal limits, no S3, no S4: no clicks, no rubs, very soft 1/6 systolic murmur RUSB. ABD:  Soft, nontender. BS +, no masses or bruits. No hepatomegaly, no splenomegaly EXT:  2 + pulses throughout, no edema, no cyanosis no clubbing SKIN:  Warm and dry.  No rashes NEURO:  Alert and oriented x 3. Cranial nerves II through XII intact. PSYCH:  Cognitively intact    LABORATORY DATA:  Lab Results  Component Value Date   WBC 13.1 (H) 11/29/2023   HGB 10.2 (L) 11/29/2023   HCT 30.6 (L) 11/29/2023   PLT 426 (H) 11/29/2023   GLUCOSE 180 (H) 11/29/2023   ALT 12 11/29/2023   AST 14 (L) 11/29/2023   NA 132 (L) 11/29/2023   K 4.8 11/29/2023   CL 95 (L) 11/29/2023   CREATININE 1.72 (H) 11/29/2023   BUN 24 (H) 11/29/2023   CO2 25 11/29/2023   INR 1.1 11/08/2023   HGBA1C 6.3 (H) 03/12/2023   Labs dated 08/22/16: sodium 131. Other chemistries normal. In August 2017. A1c 6%. LDL 74. LFTs normal. Dated 09/04/17: A1c 6.4%.  Sodium 133, glucose 140. Other chemistries normal. Feb 20, 2017: cholesterol 131, triglycerides 198, HDL 28, LDL 69. Dated 09/03/18: A1c 6.9%. cholesterol 130, triglycerides 186, HDL 28, LDL 85. Sodium 133. Otherwise chemistries and CBC normal.  Dated 10/07/19: cholesterol 133, triglycerides 159, HDL 30, LDL 86. Sodium 132, glucose 144. Otherwise CMET normal. CBC normal. A1c 6.5% Dated 10/12/20: Hgb 13.7. cholesterol  146, triglycerides 247, HDL 31. LDL 89. A1c 6.9%.  Dated 10/17/21: cholesterol 129, triglycerides 132, HDL 31, LDL 83. Sodium 131, glucose 135. Otherwise CMET and CBC normal. Dated 04/19/22: cholesterol 121, triglycerides 107, HDL 32, LDL 77. Aic 6.9%  Ecg today shows NSR with LAD. Rate 79. LAFB.  I have personally reviewed and interpreted this study.   Echo: 07/04/15: Study Conclusions   - Left ventricle: The cavity size was normal. Wall thickness was   increased in a pattern of mild LVH. Systolic function was normal.   The estimated ejection fraction was in the range of 55% to 60%.   Wall motion was normal; there were no regional wall motion   abnormalities. Doppler parameters are consistent with abnormal   left ventricular relaxation (grade 1 diastolic dysfunction). - Aortic valve: There was very mild stenosis.  Assessment / Plan: 1.  Minimal aortic stenosis. Very mild by last Echo.  No change in exam. Asymptomatic.   3. Hypertension, under control.  Continue current meds.  Low sodium  4. Hyperlipidemia, satisfactory  5. Diabetes mellitus type 2. On oral therapy. Per primary care.  Continue risk factor modification. Patient is a suitable candidate for back surgery and or Bladder surgery if needed. No additional cardiac testing needed. I think he is low risk.

## 2024-01-06 ENCOUNTER — Ambulatory Visit: Payer: Medicare Other | Attending: Cardiology | Admitting: Cardiology

## 2024-01-08 ENCOUNTER — Encounter: Payer: Self-pay | Admitting: Cardiology

## 2024-01-20 ENCOUNTER — Encounter: Payer: Self-pay | Admitting: Cardiology

## 2024-04-10 NOTE — Progress Notes (Deleted)
 Steve Hunter Date of Birth: 1946/09/25   History of Present Illness: Steve Hunter is seen today for pre op evaluation. He has a history of very mild aortic stenosis. His last echocardiogram was in September 2016. He has a  LexiScan  Myoview  study in 2013 which was normal. He did have an allergic reaction following this study with a diffuse rash, presumably from the LexiScan . He has a history of HTN, DM, and HL. In June 2017 he had L4-S1 fusion.   He underwent cervical surgery a year ago. Now has suprapubic catheter for urinary retention.  He denies any chest pain, dyspnea, edema, or palpitations. No dizziness.   Current Outpatient Medications on File Prior to Visit  Medication Sig Dispense Refill   Accu-Chek Softclix Lancets lancets USE AS DIRECTED 2-3 TIMES DAILY     aspirin EC 81 MG tablet Take 81 mg by mouth daily.     atorvastatin  (LIPITOR) 40 MG tablet Take 40 mg by mouth daily.     cephALEXin  (KEFLEX ) 500 MG capsule Take 1 capsule (500 mg total) by mouth 4 (four) times daily. 28 capsule 0   finasteride  (PROSCAR ) 5 MG tablet Take 5 mg by mouth daily.     glucose blood (ACCU-CHEK AVIVA PLUS) test strip USE AS DIRECTED 2-3 TIMES DAILY     linaclotide  (LINZESS ) 72 MCG capsule Take 72 mcg by mouth daily as needed (constipation).     LORazepam  (ATIVAN ) 1 MG tablet Take 1 mg by mouth at bedtime.     losartan  (COZAAR ) 50 MG tablet Take 50 mg by mouth daily.     metFORMIN  (GLUCOPHAGE -XR) 500 MG 24 hr tablet Take 500 mg by mouth 2 (two) times daily with a meal.     nortriptyline (PAMELOR) 25 MG capsule Take 25 mg by mouth at bedtime.     ondansetron  (ZOFRAN ) 4 MG tablet Take 1 tablet (4 mg total) by mouth every 8 (eight) hours as needed for nausea or vomiting. 20 tablet 0   oxyCODONE -acetaminophen  (PERCOCET/ROXICET) 5-325 MG tablet Take 1-2 tablets by mouth every 6 (six) hours as needed for severe pain (pain score 7-10). 6 tablet 0   pantoprazole  (PROTONIX ) 40 MG tablet Take 40 mg by mouth 2  (two) times daily.     Polyethyl Glycol-Propyl Glycol 0.4-0.3 % SOLN Place 1 drop into both eyes 3 (three) times daily as needed (dry eyes). systane eye drops     tamsulosin  (FLOMAX ) 0.4 MG CAPS capsule Take 0.4 mg by mouth daily.     No current facility-administered medications on file prior to visit.    Allergies  Allergen Reactions   Lexiscan  [Regadenoson ] Itching and Rash    Patient developed mosquite bite type rash on chest, back and leg post lexiscan .   Vibegron     Other Reaction(s): Other (See Comments)  Dry mouth, urinary retention    Past Medical History:  Diagnosis Date   Anxiety disorder    Aortic stenosis    Mild   Cancer (HCC)    Bladder   Chronic back pain    Diabetes mellitus    Type II   GERD (gastroesophageal reflux disease)    History of hiatal hernia    Hypercholesterolemia    Hypertension    Melanoma (HCC)    Multiple thyroid  nodules    Osteoarthritis    Sleep apnea    pt states he no longer has this- he had surgery (tonsillectomy) and it resolved    Past Surgical History:  Procedure Laterality  Date   ANTERIOR CERVICAL DECOMP/DISCECTOMY FUSION N/A 03/21/2023   Procedure: ANTERIOR CERVICAL DECOMPRESSION/DISCECTOMY FUSION 1 LEVEL C3-4;  Surgeon: Mort Ards, MD;  Location: [facility] OR;  Service: Orthopedics;  Laterality: N/A;   BACK SURGERY     BLADDER SURGERY     carpal tunnel surgery     CATARACT EXTRACTION W/ INTRAOCULAR LENS IMPLANT Bilateral    5 years ago   CYSTOSCOPY WITH INSERTION OF UROLIFT  07/18/2022   IR CATHETER TUBE CHANGE  12/18/2023   MELANOMA EXCISION     RADIOLOGY WITH ANESTHESIA N/A 11/08/2023   Procedure: CT GUIDED SUPRAPUBIC CATHETER PLACEMENT WITH ANESTHESIA;  Surgeon: Radiologist, Medication, MD;  Location: [facility] OR;  Service: Radiology;  Laterality: N/A;   SHOULDER SURGERY Left    TONSILLECTOMY     TOTAL KNEE ARTHROPLASTY     left   WRIST SURGERY     Right wrist    Social History   Tobacco Use  Smoking Status Former    Current packs/day: 0.00   Types: Cigarettes   Quit date: 07/02/1980   Years since quitting: 43.8  Smokeless Tobacco Never    Social History   Substance and Sexual Activity  Alcohol Use Not Currently    Family History  Problem Relation Age of Onset   Hypertension Mother    Lung cancer Father     Review of Systems: As noted in HPI.  All other systems were reviewed and are negative.  Physical Exam: There were no vitals taken for this visit. GENERAL:  Well appearing WM in NAD HEENT:  PERRL, EOMI, sclera are clear. Oropharynx is clear. NECK:  No jugular venous distention, carotid upstroke brisk and symmetric, no bruits, no thyromegaly or adenopathy LUNGS:  Clear to auscultation bilaterally CHEST:  Unremarkable HEART:  RRR,  PMI not displaced or sustained,S1 and S2 within normal limits, no S3, no S4: no clicks, no rubs, very soft 1/6 systolic murmur RUSB. ABD:  Soft, nontender. BS +, no masses or bruits. No hepatomegaly, no splenomegaly EXT:  2 + pulses throughout, no edema, no cyanosis no clubbing SKIN:  Warm and dry.  No rashes NEURO:  Alert and oriented x 3. Cranial nerves II through XII intact. PSYCH:  Cognitively intact    LABORATORY DATA:  Lab Results  Component Value Date   WBC 13.1 (H) 11/29/2023   HGB 10.2 (L) 11/29/2023   HCT 30.6 (L) 11/29/2023   PLT 426 (H) 11/29/2023   GLUCOSE 180 (H) 11/29/2023   ALT 12 11/29/2023   AST 14 (L) 11/29/2023   NA 132 (L) 11/29/2023   K 4.8 11/29/2023   CL 95 (L) 11/29/2023   CREATININE 1.72 (H) 11/29/2023   BUN 24 (H) 11/29/2023   CO2 25 11/29/2023   INR 1.1 11/08/2023   HGBA1C 6.3 (H) 03/12/2023   Labs dated 08/22/16: sodium 131. Other chemistries normal. In August 2017. A1c 6%. LDL 74. LFTs normal. Dated 09/04/17: A1c 6.4%. Sodium 133, glucose 140. Other chemistries normal. Feb 20, 2017: cholesterol 131, triglycerides 198, HDL 28, LDL 69. Dated 09/03/18: A1c 6.9%. cholesterol 130, triglycerides 186, HDL 28, LDL 85.  Sodium 133. Otherwise chemistries and CBC normal.  Dated 10/07/19: cholesterol 133, triglycerides 159, HDL 30, LDL 86. Sodium 132, glucose 144. Otherwise CMET normal. CBC normal. A1c 6.5% Dated 10/12/20: Hgb 13.7. cholesterol 146, triglycerides 247, HDL 31. LDL 89. A1c 6.9%.  Dated 10/17/21: cholesterol 129, triglycerides 132, HDL 31, LDL 83. Sodium 131, glucose 135. Otherwise CMET and CBC normal. Dated 04/19/22: cholesterol  121, triglycerides 107, HDL 32, LDL 77. Aic 6.9%  Ecg today shows NSR with LAD. Rate 79. LAFB.  I have personally reviewed and interpreted this study.   Echo: 07/04/15: Study Conclusions   - Left ventricle: The cavity size was normal. Wall thickness was   increased in a pattern of mild LVH. Systolic function was normal.   The estimated ejection fraction was in the range of 55% to 60%.   Wall motion was normal; there were no regional wall motion   abnormalities. Doppler parameters are consistent with abnormal   left ventricular relaxation (grade 1 diastolic dysfunction). - Aortic valve: There was very mild stenosis.  Assessment / Plan: 1.  Minimal aortic stenosis. Very mild by last Echo.  No change in exam. Asymptomatic.   3. Hypertension, under control.  Continue current meds.  Low sodium  4. Hyperlipidemia, satisfactory  5. Diabetes mellitus type 2. On oral therapy. Per primary care.  Continue risk factor modification. Patient is a suitable candidate for back surgery and or Bladder surgery if needed. No additional cardiac testing needed. I think he is low risk.

## 2024-04-13 ENCOUNTER — Ambulatory Visit: Admitting: Cardiology

## 2024-06-15 NOTE — Progress Notes (Deleted)
 Steve Hunter Date of Birth: 06-Jan-1946   History of Present Illness: Steve Hunter is seen today for pre op evaluation. He has a history of very mild aortic stenosis. His last echocardiogram was in September 2016. He has a  LexiScan  Myoview  study in 2013 which was normal. He did have an allergic reaction following this study with a diffuse rash, presumably from the LexiScan . He has a history of HTN, DM, and HL. In June 2017 he had L4-S1 fusion.   More recently he has worsening back pain with neuropathy. Seeing Dr Burnetta. 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 Has indwelling foley. Now seeing Dr Watt.  He denies any chest pain, dyspnea, edema, or palpitations. No dizziness.   Current Outpatient Medications on File Prior to Visit  Medication Sig Dispense Refill   Accu-Chek Softclix Lancets lancets USE AS DIRECTED 2-3 TIMES DAILY     aspirin EC 81 MG tablet Take 81 mg by mouth daily.     atorvastatin  (LIPITOR) 40 MG tablet Take 40 mg by mouth daily.     cephALEXin  (KEFLEX ) 500 MG capsule Take 1 capsule (500 mg total) by mouth 4 (four) times daily. 28 capsule 0   finasteride  (PROSCAR ) 5 MG tablet Take 5 mg by mouth daily.     glucose blood (ACCU-CHEK AVIVA PLUS) test strip USE AS DIRECTED 2-3 TIMES DAILY     linaclotide  (LINZESS ) 72 MCG capsule Take 72 mcg by mouth daily as needed (constipation).     LORazepam  (ATIVAN ) 1 MG tablet Take 1 mg by mouth at bedtime.     losartan  (COZAAR ) 50 MG tablet Take 50 mg by mouth daily.     metFORMIN  (GLUCOPHAGE -XR) 500 MG 24 hr tablet Take 500 mg by mouth 2 (two) times daily with a meal.     nortriptyline (PAMELOR) 25 MG capsule Take 25 mg by mouth at bedtime.     ondansetron  (ZOFRAN ) 4 MG tablet Take 1 tablet (4 mg total) by mouth every 8 (eight) hours as needed for nausea or vomiting. 20 tablet 0   oxyCODONE -acetaminophen  (PERCOCET/ROXICET) 5-325 MG tablet Take 1-2 tablets by mouth every 6 (six) hours as needed for severe  pain (pain score 7-10). 6 tablet 0   pantoprazole  (PROTONIX ) 40 MG tablet Take 40 mg by mouth 2 (two) times daily.     Polyethyl Glycol-Propyl Glycol 0.4-0.3 % SOLN Place 1 drop into both eyes 3 (three) times daily as needed (dry eyes). systane eye drops     tamsulosin  (FLOMAX ) 0.4 MG CAPS capsule Take 0.4 mg by mouth daily.     No current facility-administered medications on file prior to visit.    Allergies  Allergen Reactions   Lexiscan  [Regadenoson ] Itching and Rash    Patient developed mosquite bite type rash on chest, back and leg post lexiscan .   Vibegron     Other Reaction(s): Other (See Comments)  Dry mouth, urinary retention    Past Medical History:  Diagnosis Date   Anxiety disorder    Aortic stenosis    Mild   Cancer (HCC)    Bladder   Chronic back pain    Diabetes mellitus    Type II   GERD (gastroesophageal reflux disease)    History of hiatal hernia    Hypercholesterolemia    Hypertension    Melanoma (HCC)    Multiple thyroid  nodules    Osteoarthritis    Sleep apnea    pt states he no longer has  this- he had surgery (tonsillectomy) and it resolved    Past Surgical History:  Procedure Laterality Date   ANTERIOR CERVICAL DECOMP/DISCECTOMY FUSION N/A 03/21/2023   Procedure: ANTERIOR CERVICAL DECOMPRESSION/DISCECTOMY FUSION 1 LEVEL C3-4;  Surgeon: Burnetta Aures, MD;  Location: [facility] OR;  Service: Orthopedics;  Laterality: N/A;   BACK SURGERY     BLADDER SURGERY     carpal tunnel surgery     CATARACT EXTRACTION W/ INTRAOCULAR LENS IMPLANT Bilateral    5 years ago   CYSTOSCOPY WITH INSERTION OF UROLIFT  07/18/2022   IR CATHETER TUBE CHANGE  12/18/2023   MELANOMA EXCISION     RADIOLOGY WITH ANESTHESIA N/A 11/08/2023   Procedure: CT GUIDED SUPRAPUBIC CATHETER PLACEMENT WITH ANESTHESIA;  Surgeon: Radiologist, Medication, MD;  Location: [facility] OR;  Service: Radiology;  Laterality: N/A;   SHOULDER SURGERY Left    TONSILLECTOMY     TOTAL KNEE ARTHROPLASTY     left    WRIST SURGERY     Right wrist    Social History   Tobacco Use  Smoking Status Former   Current packs/day: 0.00   Types: Cigarettes   Quit date: 07/02/1980   Years since quitting: 43.9  Smokeless Tobacco Never    Social History   Substance and Sexual Activity  Alcohol Use Not Currently    Family History  Problem Relation Age of Onset   Hypertension Mother    Lung cancer Father     Review of Systems: As noted in HPI.  All other systems were reviewed and are negative.  Physical Exam: There were no vitals taken for this visit. GENERAL:  Well appearing WM in NAD HEENT:  PERRL, EOMI, sclera are clear. Oropharynx is clear. NECK:  No jugular venous distention, carotid upstroke brisk and symmetric, no bruits, no thyromegaly or adenopathy LUNGS:  Clear to auscultation bilaterally CHEST:  Unremarkable HEART:  RRR,  PMI not displaced or sustained,S1 and S2 within normal limits, no S3, no S4: no clicks, no rubs, very soft 1/6 systolic murmur RUSB. ABD:  Soft, nontender. BS +, no masses or bruits. No hepatomegaly, no splenomegaly EXT:  2 + pulses throughout, no edema, no cyanosis no clubbing SKIN:  Warm and dry.  No rashes NEURO:  Alert and oriented x 3. Cranial nerves II through XII intact. PSYCH:  Cognitively intact    LABORATORY DATA:  Lab Results  Component Value Date   WBC 13.1 (H) 11/29/2023   HGB 10.2 (L) 11/29/2023   HCT 30.6 (L) 11/29/2023   PLT 426 (H) 11/29/2023   GLUCOSE 180 (H) 11/29/2023   ALT 12 11/29/2023   AST 14 (L) 11/29/2023   NA 132 (L) 11/29/2023   K 4.8 11/29/2023   CL 95 (L) 11/29/2023   CREATININE 1.72 (H) 11/29/2023   BUN 24 (H) 11/29/2023   CO2 25 11/29/2023   INR 1.1 11/08/2023   HGBA1C 6.3 (H) 03/12/2023   Labs dated 08/22/16: sodium 131. Other chemistries normal. In August 2017. A1c 6%. LDL 74. LFTs normal. Dated 09/04/17: A1c 6.4%. Sodium 133, glucose 140. Other chemistries normal. Feb 20, 2017: cholesterol 131, triglycerides 198, HDL  28, LDL 69. Dated 09/03/18: A1c 6.9%. cholesterol 130, triglycerides 186, HDL 28, LDL 85. Sodium 133. Otherwise chemistries and CBC normal.  Dated 10/07/19: cholesterol 133, triglycerides 159, HDL 30, LDL 86. Sodium 132, glucose 144. Otherwise CMET normal. CBC normal. A1c 6.5% Dated 10/12/20: Hgb 13.7. cholesterol 146, triglycerides 247, HDL 31. LDL 89. A1c 6.9%.  Dated 10/17/21: cholesterol 129, triglycerides  132, HDL 31, LDL 83. Sodium 131, glucose 135. Otherwise CMET and CBC normal. Dated 04/19/22: cholesterol 121, triglycerides 107, HDL 32, LDL 77. Aic 6.9%  Ecg today shows NSR with LAD. Rate 79. LAFB.  I have personally reviewed and interpreted this study.   Echo: 07/04/15: Study Conclusions   - Left ventricle: The cavity size was normal. Wall thickness was   increased in a pattern of mild LVH. Systolic function was normal.   The estimated ejection fraction was in the range of 55% to 60%.   Wall motion was normal; there were no regional wall motion   abnormalities. Doppler parameters are consistent with abnormal   left ventricular relaxation (grade 1 diastolic dysfunction). - Aortic valve: There was very mild stenosis.  Assessment / Plan: 1.  Minimal aortic stenosis. Very mild by last Echo.  No change in exam. Asymptomatic.   3. Hypertension, under control.  Continue current meds.  Low sodium  4. Hyperlipidemia, satisfactory  5. Diabetes mellitus type 2. On oral therapy. Per primary care.  Continue risk factor modification. Patient is a suitable candidate for back surgery and or Bladder surgery if needed. No additional cardiac testing needed. I think he is low risk.

## 2024-06-18 ENCOUNTER — Ambulatory Visit: Attending: Cardiology | Admitting: Cardiology

## 2024-10-21 ENCOUNTER — Ambulatory Visit (HOSPITAL_COMMUNITY): Payer: Self-pay | Admitting: Physician Assistant

## 2024-10-27 ENCOUNTER — Other Ambulatory Visit (HOSPITAL_COMMUNITY)

## 2024-10-27 ENCOUNTER — Encounter (HOSPITAL_COMMUNITY)
Admission: RE | Admit: 2024-10-27 | Discharge: 2024-10-27 | Disposition: A | Discharge status: Home / Self Care | Source: Ambulatory Visit | Attending: Orthopedic Surgery | Admitting: Orthopedic Surgery

## 2024-10-27 ENCOUNTER — Other Ambulatory Visit: Payer: Self-pay

## 2024-10-27 ENCOUNTER — Encounter (HOSPITAL_COMMUNITY): Payer: Self-pay

## 2024-10-27 VITALS — BP 131/80 | HR 101 | Temp 98.0°F | Resp 18 | Ht 73.0 in | Wt 171.5 lb

## 2024-10-27 DIAGNOSIS — E785 Hyperlipidemia, unspecified: Secondary | ICD-10-CM | POA: Insufficient documentation

## 2024-10-27 DIAGNOSIS — M549 Dorsalgia, unspecified: Secondary | ICD-10-CM | POA: Insufficient documentation

## 2024-10-27 DIAGNOSIS — Z79899 Other long term (current) drug therapy: Secondary | ICD-10-CM | POA: Insufficient documentation

## 2024-10-27 DIAGNOSIS — G4733 Obstructive sleep apnea (adult) (pediatric): Secondary | ICD-10-CM | POA: Insufficient documentation

## 2024-10-27 DIAGNOSIS — N183 Chronic kidney disease, stage 3 unspecified: Secondary | ICD-10-CM | POA: Insufficient documentation

## 2024-10-27 DIAGNOSIS — F419 Anxiety disorder, unspecified: Secondary | ICD-10-CM | POA: Insufficient documentation

## 2024-10-27 DIAGNOSIS — Z01818 Encounter for other preprocedural examination: Secondary | ICD-10-CM | POA: Insufficient documentation

## 2024-10-27 DIAGNOSIS — D631 Anemia in chronic kidney disease: Secondary | ICD-10-CM | POA: Insufficient documentation

## 2024-10-27 DIAGNOSIS — E119 Type 2 diabetes mellitus without complications: Secondary | ICD-10-CM

## 2024-10-27 DIAGNOSIS — E1122 Type 2 diabetes mellitus with diabetic chronic kidney disease: Secondary | ICD-10-CM | POA: Insufficient documentation

## 2024-10-27 DIAGNOSIS — K219 Gastro-esophageal reflux disease without esophagitis: Secondary | ICD-10-CM | POA: Insufficient documentation

## 2024-10-27 DIAGNOSIS — G8929 Other chronic pain: Secondary | ICD-10-CM | POA: Insufficient documentation

## 2024-10-27 DIAGNOSIS — I129 Hypertensive chronic kidney disease with stage 1 through stage 4 chronic kidney disease, or unspecified chronic kidney disease: Secondary | ICD-10-CM | POA: Insufficient documentation

## 2024-10-27 LAB — CBC
HCT: 38.1 % — ABNORMAL LOW (ref 39.0–52.0)
Hemoglobin: 12.7 g/dL — ABNORMAL LOW (ref 13.0–17.0)
MCH: 31.4 pg (ref 26.0–34.0)
MCHC: 33.3 g/dL (ref 30.0–36.0)
MCV: 94.1 fL (ref 80.0–100.0)
Platelets: 363 K/uL (ref 150–400)
RBC: 4.05 MIL/uL — ABNORMAL LOW (ref 4.22–5.81)
RDW: 13.1 % (ref 11.5–15.5)
WBC: 9.5 K/uL (ref 4.0–10.5)
nRBC: 0 % (ref 0.0–0.2)

## 2024-10-27 LAB — BASIC METABOLIC PANEL WITH GFR
Anion gap: 9 (ref 5–15)
BUN: 16 mg/dL (ref 8–23)
CO2: 28 mmol/L (ref 22–32)
Calcium: 9.5 mg/dL (ref 8.9–10.3)
Chloride: 96 mmol/L — ABNORMAL LOW (ref 98–111)
Creatinine, Ser: 1.39 mg/dL — ABNORMAL HIGH (ref 0.61–1.24)
GFR, Estimated: 52 mL/min — ABNORMAL LOW
Glucose, Bld: 118 mg/dL — ABNORMAL HIGH (ref 70–99)
Potassium: 4.5 mmol/L (ref 3.5–5.1)
Sodium: 134 mmol/L — ABNORMAL LOW (ref 135–145)

## 2024-10-27 LAB — TYPE AND SCREEN
ABO/RH(D): A POS
Antibody Screen: NEGATIVE

## 2024-10-27 LAB — SURGICAL PCR SCREEN
MRSA, PCR: NEGATIVE
Staphylococcus aureus: NEGATIVE

## 2024-10-27 LAB — GLUCOSE, CAPILLARY: Glucose-Capillary: 136 mg/dL — ABNORMAL HIGH (ref 70–99)

## 2024-10-27 LAB — HEMOGLOBIN A1C
Hgb A1c MFr Bld: 6.1 % — ABNORMAL HIGH (ref 4.8–5.6)
Mean Plasma Glucose: 128.37 mg/dL

## 2024-10-27 NOTE — Progress Notes (Signed)
 PCP - Butler Aho, PA Cardiologist - Dr. Peter Jordan  PPM/ICD - denies Device Orders - na Rep Notified - na  Chest x-ray - 02/10/2024 - CE EKG - PAT, 10/27/2024 Stress Test - 06/04/2012 ECHO - 07/04/2015 Cardiac Cath -   Sleep Study - had a tonsillectomy and no longer has sleep apnea CPAP - na  Type II diabetic. A1C 6.0 on 05/15/2024, current one drawn at PAT. Blood sugar 136  at PAT appointment Fasting Blood Sugar - 130-160 Checks Blood Sugar : weekly Metformin , hold DOS  Last dose of GLP1 agonist-  denies GLP1 instructions: denies  Blood Thinner Instructions:denies Aspirin Instructions:follow surgeon's instructions  ERAS Protcol -Clears until 0830  Anesthesia review: Yes. HTN, DM, aortic stenosis  Patient denies shortness of breath, fever, cough and chest pain at PAT appointment   All instructions explained to the patient, with a verbal understanding of the material. Patient agrees to go over the instructions while at home for a better understanding. Patient also instructed to self quarantine after being tested for COVID-19. The opportunity to ask questions was provided.

## 2024-10-27 NOTE — Progress Notes (Signed)
 Surgical Instructions   Your procedure is scheduled on Thursday October 29, 2024. Report to [facility] Main Entrance A at 9:30 A.M., then check in with the Admitting office. Any questions or running late day of surgery: call [phone]  Questions prior to your surgery date: call [phone], Monday-Friday, 8am-4pm. If you experience any cold or flu symptoms such as cough, fever, chills, shortness of breath, etc. between now and your scheduled surgery, please notify us  at the above number.     Remember:  Do not eat after midnight the night before your surgery  You may drink clear liquids until 8:30 the morning of your surgery.   Clear liquids allowed are: Water, Non-Citrus Juices (without pulp), Carbonated Beverages, Clear Tea (no milk, honey, etc.), Black Coffee Only (NO MILK, CREAM OR POWDERED CREAMER of any kind), and Gatorade.    Take these medicines the morning of surgery with A SIP OF WATER  atorvastatin  (LIPITOR)  pantoprazole  (PROTONIX )   May take these medicines IF NEEDED: HYDROcodone-acetaminophen  (NORCO/VICODIN)  lubiprostone (AMITIZA)  Polyethyl Glycol-Propyl Glycol 0.4-0.3 % SOLN   Follow your surgeon's instructions on when to stop Asprin.  If no instructions were given by your surgeon then you will need to call the office to get those instructions.     One week prior to surgery, STOP taking any Aleve, Naproxen, Ibuprofen, Motrin, Advil, Goody's, BC's, all herbal medications, fish oil, and non-prescription vitamins.  This includes your CELEBREX.     WHAT DO I DO ABOUT MY DIABETES MEDICATION?   Do not take oral diabetes medicines (pills) the morning of surgery.        DO NOT TAKE YOUR metFORMIN  (GLUCOPHAGE -XR) THE MORNING OR SURGERY.      The day of surgery, do not take other diabetes injectables, including Byetta (exenatide), Bydureon (exenatide ER), Victoza (liraglutide), or Trulicity (dulaglutide).  If your CBG is greater than 220 mg/dL, you may take  of  your sliding scale (correction) dose of insulin .   HOW TO MANAGE YOUR DIABETES BEFORE AND AFTER SURGERY  Why is it important to control my blood sugar before and after surgery? Improving blood sugar levels before and after surgery helps healing and can limit problems. A way of improving blood sugar control is eating a healthy diet by:  Eating less sugar and carbohydrates  Increasing activity/exercise  Talking with your doctor about reaching your blood sugar goals High blood sugars (greater than 180 mg/dL) can raise your risk of infections and slow your recovery, so you will need to focus on controlling your diabetes during the weeks before surgery. Make sure that the doctor who takes care of your diabetes knows about your planned surgery including the date and location.  How do I manage my blood sugar before surgery? Check your blood sugar at least 4 times a day, starting 2 days before surgery, to make sure that the level is not too high or low.  Check your blood sugar the morning of your surgery when you wake up and every 2 hours until you get to the Short Stay unit.  If your blood sugar is less than 70 mg/dL, you will need to treat for low blood sugar: Do not take insulin . Treat a low blood sugar (less than 70 mg/dL) with  cup of clear juice (cranberry or apple), 4 glucose tablets, OR glucose gel. Recheck blood sugar in 15 minutes after treatment (to make sure it is greater than 70 mg/dL). If your blood sugar is not greater than 70 mg/dL  on recheck, call [phone] for further instructions. Report your blood sugar to the short stay nurse when you get to Short Stay.  If you are admitted to the hospital after surgery: Your blood sugar will be checked by the staff and you will probably be given insulin  after surgery (instead of oral diabetes medicines) to make sure you have good blood sugar levels. The goal for blood sugar control after surgery is 80-180 mg/dL.                       Do NOT Smoke (Tobacco/Vaping) for 24 hours prior to your procedure.  If you use a CPAP at night, you may bring your mask/headgear for your overnight stay.   You will be asked to remove any contacts, glasses, piercing's, hearing aid's, dentures/partials prior to surgery. Please bring cases for these items if needed.    Patients discharged the day of surgery will not be allowed to drive home, and someone needs to stay with them for 24 hours.  SURGICAL WAITING ROOM VISITATION Patients may have no more than 2 support people in the waiting area - these visitors may rotate.   Pre-op nurse will coordinate an appropriate time for 1 ADULT support person, who may not rotate, to accompany patient in pre-op.  Children under the age of 4 must have an adult with them who is not the patient and must remain in the main waiting area with an adult.  If the patient needs to stay at the hospital during part of their recovery, the visitor guidelines for inpatient rooms apply.  Please refer to the [facility] website for the visitor guidelines for any additional information.   If you received a COVID test during your pre-op visit  it is requested that you wear a mask when out in public, stay away from anyone that may not be feeling well and notify your surgeon if you develop symptoms. If you have been in contact with anyone that has tested positive in the last 10 days please notify you surgeon.      Pre-operative 4 CHG Bathing Instructions   You can play a key role in reducing the risk of infection after surgery. Your skin needs to be as free of germs as possible. You can reduce the number of germs on your skin by washing with CHG (chlorhexidine  gluconate) soap before surgery. CHG is an antiseptic soap that kills germs and continues to kill germs even after washing.   DO NOT use if you have an allergy to chlorhexidine /CHG or antibacterial soaps. If your skin becomes reddened or irritated, stop using the CHG  and notify one of our RNs at [phone].   Please shower with the CHG soap starting 4 days before surgery using the following schedule:     Please keep in mind the following:  You may shave your face at any point before/day of surgery.  Place clean sheets on your bed the day you start using CHG soap. Use a clean washcloth (not used since being washed) for each shower. DO NOT sleep with pets once you start using the CHG.   CHG Shower Instructions:  Wash your face and private area with normal soap. If you choose to wash your hair, wash first with your normal shampoo.  After you use shampoo/soap, rinse your hair and body thoroughly to remove shampoo/soap residue.  Turn the water OFF and apply  bottle of CHG soap to a CLEAN washcloth.  Apply CHG soap  ONLY FROM YOUR NECK DOWN TO YOUR TOES (washing for 3-5 minutes)  DO NOT use CHG soap on face, private areas, open wounds, or sores.  Pay special attention to the area where your surgery is being performed.  If you are having back surgery, having someone wash your back for you may be helpful. Wait 2 minutes after CHG soap is applied, then you may rinse off the CHG soap.  Pat dry with a clean towel  Put on clean clothes/pajamas   If you choose to wear lotion, please use ONLY the CHG-compatible lotions that are listed below.  Additional instructions for the day of surgery:  If you choose, you may shower the morning of surgery with an antibacterial soap.  DO NOT APPLY any lotions, deodorants or cologne   Do not bring valuables to the hospital. [facility] is not responsible for any belongings/valuables. Do not wear jewelry  Put on clean/comfortable clothes.  Please brush your teeth.  Ask your nurse before applying any prescription medications to the skin.     CHG Compatible Lotions   Aveeno Moisturizing lotion  Cetaphil Moisturizing Cream  Cetaphil Moisturizing Lotion  Clairol Herbal Essence Moisturizing Lotion, Dry Skin  Clairol  Herbal Essence Moisturizing Lotion, Extra Dry Skin  Clairol Herbal Essence Moisturizing Lotion, Normal Skin  Curel Age Defying Therapeutic Moisturizing Lotion with Alpha Hydroxy  Curel Extreme Care Body Lotion  Curel Soothing Hands Moisturizing Hand Lotion  Curel Therapeutic Moisturizing Cream, Fragrance-Free  Curel Therapeutic Moisturizing Lotion, Fragrance-Free  Curel Therapeutic Moisturizing Lotion, Original Formula  Eucerin Daily Replenishing Lotion  Eucerin Dry Skin Therapy Plus Alpha Hydroxy Crme  Eucerin Dry Skin Therapy Plus Alpha Hydroxy Lotion  Eucerin Original Crme  Eucerin Original Lotion  Eucerin Plus Crme Eucerin Plus Lotion  Eucerin TriLipid Replenishing Lotion  Keri Anti-Bacterial Hand Lotion  Keri Deep Conditioning Original Lotion Dry Skin Formula Softly Scented  Keri Deep Conditioning Original Lotion, Fragrance Free Sensitive Skin Formula  Keri Lotion Fast Absorbing Fragrance Free Sensitive Skin Formula  Keri Lotion Fast Absorbing Softly Scented Dry Skin Formula  Keri Original Lotion  Keri Skin Renewal Lotion Keri Silky Smooth Lotion  Keri Silky Smooth Sensitive Skin Lotion  Nivea Body Creamy Conditioning Oil  Nivea Body Extra Enriched Lotion  Nivea Body Original Lotion  Nivea Body Sheer Moisturizing Lotion Nivea Crme  Nivea Skin Firming Lotion  NutraDerm 30 Skin Lotion  NutraDerm Skin Lotion  NutraDerm Therapeutic Skin Cream  NutraDerm Therapeutic Skin Lotion  ProShield Protective Hand Cream  Provon moisturizing lotion  Please read over the following fact sheets that you were given.

## 2024-10-28 NOTE — Progress Notes (Signed)
 Anesthesia Chart Review:  79 year old male with pertinent history including former smoker (quit 07/02/80), HTN, HLD, CKD 3, GERD on PPI, non-insulin -dependent DM2, OSA (s/p uvulectomy; does not use CPAP), bladder cancer (s/p TURBT 2001; s/p Urolift and bladder biopsy/fulguration 07/18/22), anxiety, chronic back pain, spinal surgery (L4-S1 PLIF 04/10/16; C3-4 ACDF 03/21/23).  Patient documented as having very mild aortic stenosis by echo 07/04/2015 which showed mean gradient 6 mmHg and peak gradient 15 mmHg which was reportedly unchanged from echo 5 years prior.  He was last seen in cardiology follow-up by Dr. Peter Jordan on 01/07/2023 and noted to be stable from cardiac standpoint, asymptomatic.  He was cleared at that time to undergo back surgery as low risk.  No further testing was recommended, no specific recommendation for cardiology follow-up.  Patient did subsequently undergo C3-4 ACDF on 03/21/2023 without complication.    Preop labs reviewed, mild hyponatremia sodium 134, creatinine mildly elevated 1.39, mild anemia hemoglobin 12.7.  DM2 well-controlled with A1c 6.1.  EKG 10/27/2024: Sinus rhythm with PACs.  Rate 79.  LAFB.  TTE 07/04/2015: - Left ventricle: The cavity size was normal. Wall thickness was    increased in a pattern of mild LVH. Systolic function was normal.    The estimated ejection fraction was in the range of 55% to 60%.    Wall motion was normal; there were no regional wall motion    abnormalities. Doppler parameters are consistent with abnormal    left ventricular relaxation (grade 1 diastolic dysfunction).  - Aortic valve: There was very mild stenosis.  Mean gradient (S): 6 mm Hg. Peak gradient (S): 15 mm Hg.     Lynwood Geofm RIGGERS [facility] Short Stay Center/Anesthesiology Phone [phone] 10/28/2024 9:57 AM

## 2024-10-28 NOTE — Anesthesia Preprocedure Evaluation (Addendum)
"                                    Anesthesia Evaluation  Patient identified by MRN, date of birth, ID band Patient awake    Reviewed: Allergy & Precautions, NPO status , Patient's Chart, lab work & pertinent test results  Airway Mallampati: I  TM Distance: >3 FB Neck ROM: Full    Dental  (+) Teeth Intact, Dental Advisory Given   Pulmonary sleep apnea (per pt resolved after UPP) , former smoker   Pulmonary exam normal breath sounds clear to auscultation       Cardiovascular hypertension (170/95 preop), Pt. on medications Normal cardiovascular exam+ Valvular Problems/Murmurs (mild AS) AS  Rhythm:Regular Rate:Normal  Echo 2016 - Left ventricle: The cavity size was normal. Wall thickness was    increased in a pattern of mild LVH. Systolic function was normal.    The estimated ejection fraction was in the range of 55% to 60%.    Wall motion was normal; there were no regional wall motion    abnormalities. Doppler parameters are consistent with abnormal    left ventricular relaxation (grade 1 diastolic dysfunction).  - Aortic valve: There was very mild stenosis.     Neuro/Psych  PSYCHIATRIC DISORDERS Anxiety        GI/Hepatic Neg liver ROS, hiatal hernia,GERD  Medicated and Controlled,,  Endo/Other  diabetes, Well Controlled, Type 2, Oral Hypoglycemic Agents    Renal/GU Renal InsufficiencyRenal disease (cr 1.39)  negative genitourinary   Musculoskeletal  (+) Arthritis , Osteoarthritis,  Spinal stenosis    Abdominal   Peds  Hematology negative hematology ROS (+) Hb 12.7   Anesthesia Other Findings Recent fall onto L arm- skin abrasions covered w/ dressings   Reproductive/Obstetrics negative OB ROS                              Anesthesia Physical Anesthesia Plan  ASA: 3  Anesthesia Plan: General   Post-op Pain Management: Tylenol  PO (pre-op)*   Induction: Intravenous  PONV Risk Score and Plan: 2 and Ondansetron ,  Dexamethasone  and Treatment may vary due to age or medical condition  Airway Management Planned: Oral ETT and Video Laryngoscope Planned  Additional Equipment: None  Intra-op Plan:   Post-operative Plan: Extubation in OR  Informed Consent: I have reviewed the patients History and Physical, chart, labs and discussed the procedure including the risks, benefits and alternatives for the proposed anesthesia with the patient or authorized representative who has indicated his/her understanding and acceptance.     Dental advisory given  Plan Discussed with: CRNA  Anesthesia Plan Comments: ( )         Anesthesia Quick Evaluation  "

## 2024-10-29 ENCOUNTER — Encounter (HOSPITAL_COMMUNITY): Admission: RE | Disposition: A | Payer: Self-pay | Source: Home / Self Care | Attending: Orthopedic Surgery

## 2024-10-29 ENCOUNTER — Inpatient Hospital Stay (HOSPITAL_COMMUNITY): Payer: Self-pay | Admitting: Physician Assistant

## 2024-10-29 ENCOUNTER — Inpatient Hospital Stay (HOSPITAL_COMMUNITY)
Admission: RE | Admit: 2024-10-29 | Discharge: 2024-11-05 | DRG: 448 | Disposition: A | Discharge status: Home / Self Care | Attending: Orthopedic Surgery | Admitting: Orthopedic Surgery

## 2024-10-29 ENCOUNTER — Inpatient Hospital Stay (HOSPITAL_COMMUNITY): Admitting: Anesthesiology

## 2024-10-29 ENCOUNTER — Inpatient Hospital Stay (HOSPITAL_COMMUNITY)

## 2024-10-29 DIAGNOSIS — Z79899 Other long term (current) drug therapy: Secondary | ICD-10-CM

## 2024-10-29 DIAGNOSIS — E119 Type 2 diabetes mellitus without complications: Secondary | ICD-10-CM | POA: Diagnosis present

## 2024-10-29 DIAGNOSIS — Z85820 Personal history of malignant melanoma of skin: Secondary | ICD-10-CM

## 2024-10-29 DIAGNOSIS — M48062 Spinal stenosis, lumbar region with neurogenic claudication: Principal | ICD-10-CM | POA: Diagnosis present

## 2024-10-29 DIAGNOSIS — I10 Essential (primary) hypertension: Secondary | ICD-10-CM

## 2024-10-29 DIAGNOSIS — F419 Anxiety disorder, unspecified: Secondary | ICD-10-CM | POA: Diagnosis present

## 2024-10-29 DIAGNOSIS — Z87891 Personal history of nicotine dependence: Secondary | ICD-10-CM

## 2024-10-29 DIAGNOSIS — E7800 Pure hypercholesterolemia, unspecified: Secondary | ICD-10-CM | POA: Diagnosis present

## 2024-10-29 DIAGNOSIS — E871 Hypo-osmolality and hyponatremia: Secondary | ICD-10-CM | POA: Diagnosis present

## 2024-10-29 DIAGNOSIS — Z7982 Long term (current) use of aspirin: Secondary | ICD-10-CM

## 2024-10-29 DIAGNOSIS — Z7984 Long term (current) use of oral hypoglycemic drugs: Secondary | ICD-10-CM | POA: Diagnosis not present

## 2024-10-29 DIAGNOSIS — I350 Nonrheumatic aortic (valve) stenosis: Secondary | ICD-10-CM | POA: Diagnosis present

## 2024-10-29 DIAGNOSIS — M48061 Spinal stenosis, lumbar region without neurogenic claudication: Principal | ICD-10-CM | POA: Diagnosis present

## 2024-10-29 DIAGNOSIS — R296 Repeated falls: Secondary | ICD-10-CM | POA: Diagnosis not present

## 2024-10-29 DIAGNOSIS — Z888 Allergy status to other drugs, medicaments and biological substances status: Secondary | ICD-10-CM | POA: Diagnosis not present

## 2024-10-29 HISTORY — PX: DECOMPRESSIVE LUMBAR LAMINECTOMY LEVEL 2: SHX5792

## 2024-10-29 LAB — GLUCOSE, CAPILLARY
Glucose-Capillary: 145 mg/dL — ABNORMAL HIGH (ref 70–99)
Glucose-Capillary: 140 mg/dL — ABNORMAL HIGH (ref 70–99)
Glucose-Capillary: 194 mg/dL — ABNORMAL HIGH (ref 70–99)
Glucose-Capillary: 156 mg/dL — ABNORMAL HIGH (ref 70–99)

## 2024-10-29 MED ORDER — METHOCARBAMOL 500 MG PO TABS: 500.0000 mg | ORAL_TABLET | Freq: Three times a day (TID) | ORAL | 0 refills | Status: DC | PRN

## 2024-10-29 MED ORDER — ORAL CARE MOUTH RINSE: 15.0000 mL | Freq: Once | OROMUCOSAL | Status: AC

## 2024-10-29 MED ORDER — CEFAZOLIN SODIUM-DEXTROSE 1-4 GM/50ML-% IV SOLN
1.0000 g | Freq: Three times a day (TID) | INTRAVENOUS | Status: AC
  Administered 2024-10-29 – 2024-10-30 (×2): 1 g via INTRAVENOUS
  Filled 2024-10-29 (×3): qty 50

## 2024-10-29 MED ORDER — LACTATED RINGERS IV SOLN: INTRAVENOUS | Status: DC

## 2024-10-29 MED ORDER — CEFAZOLIN SODIUM-DEXTROSE 2-4 GM/100ML-% IV SOLN
2.0000 g | INTRAVENOUS | Status: AC
  Administered 2024-10-29: 2 g via INTRAVENOUS
  Filled 2024-10-29: qty 100

## 2024-10-29 MED ORDER — HYDROMORPHONE HCL 1 MG/ML IJ SOLN
0.5000 mg | INTRAMUSCULAR | Status: AC | PRN
  Administered 2024-10-29 – 2024-10-30 (×2): 0.5 mg via INTRAVENOUS
  Filled 2024-10-29 (×2): qty 0.5

## 2024-10-29 MED ORDER — FENTANYL CITRATE (PF) 100 MCG/2ML IJ SOLN
INTRAMUSCULAR | Status: AC
  Filled 2024-10-29: qty 2

## 2024-10-29 MED ORDER — ACETAMINOPHEN 325 MG PO TABS
650.0000 mg | ORAL_TABLET | ORAL | Status: DC | PRN
  Administered 2024-10-30: 650 mg via ORAL
  Filled 2024-10-29: qty 2

## 2024-10-29 MED ORDER — INSULIN ASPART 100 UNIT/ML IJ SOLN: 0.0000 [IU] | INTRAMUSCULAR | Status: DC | PRN

## 2024-10-29 MED ORDER — ONDANSETRON HCL 4 MG/2ML IJ SOLN: 4.0000 mg | Freq: Once | INTRAMUSCULAR | Status: DC | PRN

## 2024-10-29 MED ORDER — OXYCODONE HCL 5 MG PO TABS
10.0000 mg | ORAL_TABLET | ORAL | Status: DC | PRN
  Administered 2024-10-30 – 2024-11-01 (×4): 10 mg via ORAL
  Filled 2024-10-29 (×5): qty 2

## 2024-10-29 MED ORDER — HYDROMORPHONE HCL 1 MG/ML IJ SOLN
INTRAMUSCULAR | Status: DC | PRN
  Administered 2024-10-29: .5 mg via INTRAVENOUS

## 2024-10-29 MED ORDER — METHYLPREDNISOLONE ACETATE 40 MG/ML IJ SUSP
INTRAMUSCULAR | Status: AC
  Filled 2024-10-29: qty 1

## 2024-10-29 MED ORDER — METFORMIN HCL ER 500 MG PO TB24: 500.0000 mg | ORAL_TABLET | Freq: Two times a day (BID) | ORAL | Status: DC

## 2024-10-29 MED ORDER — OXYCODONE-ACETAMINOPHEN 10-325 MG PO TABS: 1.0000 | ORAL_TABLET | Freq: Four times a day (QID) | ORAL | 0 refills | Status: DC | PRN

## 2024-10-29 MED ORDER — ATORVASTATIN CALCIUM 40 MG PO TABS
40.0000 mg | ORAL_TABLET | Freq: Every day | ORAL | Status: DC
  Administered 2024-10-29 – 2024-11-05 (×8): 40 mg via ORAL
  Filled 2024-10-29 (×8): qty 1

## 2024-10-29 MED ORDER — ROCURONIUM BROMIDE 10 MG/ML (PF) SYRINGE
PREFILLED_SYRINGE | INTRAVENOUS | Status: DC | PRN
  Administered 2024-10-29: 60 mg via INTRAVENOUS

## 2024-10-29 MED ORDER — OXYCODONE HCL 5 MG/5ML PO SOLN: 5.0000 mg | Freq: Once | ORAL | Status: DC | PRN

## 2024-10-29 MED ORDER — PHENYLEPHRINE HCL-NACL 20-0.9 MG/250ML-% IV SOLN
INTRAVENOUS | Status: DC | PRN
  Administered 2024-10-29: 20 ug/min via INTRAVENOUS

## 2024-10-29 MED ORDER — 0.9 % SODIUM CHLORIDE (POUR BTL) OPTIME
TOPICAL | Status: DC | PRN
  Administered 2024-10-29 (×2): 1000 mL

## 2024-10-29 MED ORDER — PROPOFOL 10 MG/ML IV BOLUS
INTRAVENOUS | Status: DC | PRN
  Administered 2024-10-29: 100 mg via INTRAVENOUS

## 2024-10-29 MED ORDER — PHENYLEPHRINE 80 MCG/ML (10ML) SYRINGE FOR IV PUSH (FOR BLOOD PRESSURE SUPPORT)
PREFILLED_SYRINGE | INTRAVENOUS | Status: DC | PRN
  Administered 2024-10-29: 80 ug via INTRAVENOUS

## 2024-10-29 MED ORDER — ACETAMINOPHEN 650 MG RE SUPP: 650.0000 mg | RECTAL | Status: DC | PRN

## 2024-10-29 MED ORDER — ONDANSETRON HCL 4 MG PO TABS: 4.0000 mg | ORAL_TABLET | Freq: Three times a day (TID) | ORAL | 0 refills | Status: AC | PRN

## 2024-10-29 MED ORDER — BUPIVACAINE-EPINEPHRINE (PF) 0.25% -1:200000 IJ SOLN
INTRAMUSCULAR | Status: DC | PRN
  Administered 2024-10-29: 30 mL via PERINEURAL

## 2024-10-29 MED ORDER — LORAZEPAM 1 MG PO TABS
1.0000 mg | ORAL_TABLET | Freq: Every day | ORAL | Status: DC
  Administered 2024-10-29 – 2024-11-01 (×4): 1 mg via ORAL
  Filled 2024-10-29 (×4): qty 1

## 2024-10-29 MED ORDER — NORTRIPTYLINE HCL 25 MG PO CAPS
25.0000 mg | ORAL_CAPSULE | Freq: Every day | ORAL | Status: DC
  Administered 2024-10-29 – 2024-11-04 (×7): 25 mg via ORAL
  Filled 2024-10-29 (×7): qty 1

## 2024-10-29 MED ORDER — METHOCARBAMOL 1000 MG/10ML IJ SOLN
500.0000 mg | Freq: Four times a day (QID) | INTRAMUSCULAR | Status: DC | PRN
  Administered 2024-10-30: 500 mg via INTRAVENOUS

## 2024-10-29 MED ORDER — ONDANSETRON HCL 4 MG PO TABS: 4.0000 mg | ORAL_TABLET | Freq: Four times a day (QID) | ORAL | Status: DC | PRN

## 2024-10-29 MED ORDER — LABETALOL HCL 5 MG/ML IV SOLN
INTRAVENOUS | Status: AC
  Filled 2024-10-29: qty 4

## 2024-10-29 MED ORDER — INSULIN ASPART 100 UNIT/ML IJ SOLN
0.0000 [IU] | Freq: Three times a day (TID) | INTRAMUSCULAR | Status: DC
  Administered 2024-11-02 – 2024-11-03 (×4): 2 [IU] via SUBCUTANEOUS
  Filled 2024-10-29: qty 2
  Filled 2024-10-29: qty 3
  Filled 2024-10-29: qty 2
  Filled 2024-10-29: qty 3
  Filled 2024-10-29: qty 1
  Filled 2024-10-29: qty 2

## 2024-10-29 MED ORDER — LOSARTAN POTASSIUM 50 MG PO TABS
25.0000 mg | ORAL_TABLET | Freq: Every day | ORAL | Status: DC
  Administered 2024-10-29 – 2024-11-05 (×8): 25 mg via ORAL
  Filled 2024-10-29 (×9): qty 1

## 2024-10-29 MED ORDER — BUPIVACAINE-EPINEPHRINE (PF) 0.25% -1:200000 IJ SOLN
INTRAMUSCULAR | Status: AC
  Filled 2024-10-29: qty 30

## 2024-10-29 MED ORDER — OXYCODONE HCL 5 MG PO TABS
5.0000 mg | ORAL_TABLET | ORAL | Status: DC | PRN
  Administered 2024-10-29 – 2024-10-31 (×3): 5 mg via ORAL
  Filled 2024-10-29 (×3): qty 1

## 2024-10-29 MED ORDER — VANCOMYCIN HCL 500 MG IV SOLR
INTRAVENOUS | Status: AC
  Filled 2024-10-29: qty 10

## 2024-10-29 MED ORDER — METHOCARBAMOL 500 MG PO TABS
500.0000 mg | ORAL_TABLET | Freq: Four times a day (QID) | ORAL | Status: DC | PRN
  Administered 2024-10-29: 500 mg via ORAL
  Filled 2024-10-29 (×2): qty 1

## 2024-10-29 MED ORDER — THROMBIN 20000 UNITS EX SOLR: CUTANEOUS | Status: DC | PRN

## 2024-10-29 MED ORDER — MAGNESIUM CITRATE PO SOLN: 0.5000 | Freq: Once | ORAL | Status: DC | PRN

## 2024-10-29 MED ORDER — VANCOMYCIN HCL 500 MG IV SOLR
INTRAVENOUS | Status: DC | PRN
  Administered 2024-10-29: 500 mg

## 2024-10-29 MED ORDER — POLYETHYLENE GLYCOL 3350 17 G PO PACK
17.0000 g | PACK | Freq: Every day | ORAL | Status: DC | PRN
  Administered 2024-10-31 – 2024-11-03 (×2): 17 g via ORAL
  Filled 2024-10-29 (×2): qty 1

## 2024-10-29 MED ORDER — SUGAMMADEX SODIUM 200 MG/2ML IV SOLN
INTRAVENOUS | Status: DC | PRN
  Administered 2024-10-29: 100 mg via INTRAVENOUS

## 2024-10-29 MED ORDER — LABETALOL HCL 5 MG/ML IV SOLN
INTRAVENOUS | Status: DC | PRN
  Administered 2024-10-29: 5 mg via INTRAVENOUS

## 2024-10-29 MED ORDER — CHLORHEXIDINE GLUCONATE 0.12 % MT SOLN
15.0000 mL | Freq: Once | OROMUCOSAL | Status: AC
  Administered 2024-10-29: 15 mL via OROMUCOSAL
  Filled 2024-10-29: qty 15

## 2024-10-29 MED ORDER — FENTANYL CITRATE (PF) 250 MCG/5ML IJ SOLN
INTRAMUSCULAR | Status: AC
  Filled 2024-10-29: qty 5

## 2024-10-29 MED ORDER — ONDANSETRON HCL 4 MG/2ML IJ SOLN
INTRAMUSCULAR | Status: DC | PRN
  Administered 2024-10-29: 4 mg via INTRAVENOUS

## 2024-10-29 MED ORDER — SODIUM CHLORIDE 0.9 % IV SOLN: 250.0000 mL | INTRAVENOUS | Status: AC

## 2024-10-29 MED ORDER — HYDROMORPHONE HCL 1 MG/ML IJ SOLN
0.2500 mg | INTRAMUSCULAR | Status: DC | PRN
  Administered 2024-10-29 (×2): 0.5 mg via INTRAVENOUS

## 2024-10-29 MED ORDER — PHENOL 1.4 % MT LIQD: 1.0000 | OROMUCOSAL | Status: DC | PRN

## 2024-10-29 MED ORDER — HYDROMORPHONE HCL 1 MG/ML IJ SOLN
INTRAMUSCULAR | Status: AC
  Filled 2024-10-29: qty 0.5

## 2024-10-29 MED ORDER — INSULIN ASPART 100 UNIT/ML IJ SOLN: 0.0000 [IU] | Freq: Every day | INTRAMUSCULAR | Status: DC

## 2024-10-29 MED ORDER — AMISULPRIDE (ANTIEMETIC) 5 MG/2ML IV SOLN: 10.0000 mg | Freq: Once | INTRAVENOUS | Status: DC | PRN

## 2024-10-29 MED ORDER — FENTANYL CITRATE (PF) 250 MCG/5ML IJ SOLN
INTRAMUSCULAR | Status: DC | PRN
  Administered 2024-10-29 (×4): 50 ug via INTRAVENOUS

## 2024-10-29 MED ORDER — THROMBIN 20000 UNITS EX SOLR
CUTANEOUS | Status: AC
  Filled 2024-10-29: qty 20000

## 2024-10-29 MED ORDER — MENTHOL 3 MG MT LOZG: 1.0000 | LOZENGE | OROMUCOSAL | Status: DC | PRN

## 2024-10-29 MED ORDER — TRANEXAMIC ACID 1000 MG/10ML IV SOLN
INTRAVENOUS | Status: DC | PRN
  Administered 2024-10-29: 1000 mg via INTRAVENOUS

## 2024-10-29 MED ORDER — SODIUM CHLORIDE 0.9% FLUSH: 3.0000 mL | INTRAVENOUS | Status: DC | PRN

## 2024-10-29 MED ORDER — TRANEXAMIC ACID-NACL 1000-0.7 MG/100ML-% IV SOLN
INTRAVENOUS | Status: AC
  Filled 2024-10-29: qty 100

## 2024-10-29 MED ORDER — DEXAMETHASONE SOD PHOSPHATE PF 10 MG/ML IJ SOLN
INTRAMUSCULAR | Status: DC | PRN
  Administered 2024-10-29: 5 mg via INTRAVENOUS

## 2024-10-29 MED ORDER — LIDOCAINE 2% (20 MG/ML) 5 ML SYRINGE
INTRAMUSCULAR | Status: DC | PRN
  Administered 2024-10-29: 60 mg via INTRAVENOUS

## 2024-10-29 MED ORDER — HYDROMORPHONE HCL 1 MG/ML IJ SOLN
INTRAMUSCULAR | Status: AC
  Filled 2024-10-29: qty 1

## 2024-10-29 MED ORDER — SODIUM CHLORIDE 0.9% FLUSH
3.0000 mL | Freq: Two times a day (BID) | INTRAVENOUS | Status: DC
  Administered 2024-10-29 – 2024-11-03 (×10): 3 mL via INTRAVENOUS

## 2024-10-29 MED ORDER — OXYCODONE HCL 5 MG PO TABS: 5.0000 mg | ORAL_TABLET | Freq: Once | ORAL | Status: DC | PRN

## 2024-10-29 MED ORDER — ACETAMINOPHEN 500 MG PO TABS
1000.0000 mg | ORAL_TABLET | Freq: Once | ORAL | Status: AC
  Administered 2024-10-29: 1000 mg via ORAL
  Filled 2024-10-29: qty 2

## 2024-10-29 MED ORDER — ONDANSETRON HCL 4 MG/2ML IJ SOLN
4.0000 mg | Freq: Four times a day (QID) | INTRAMUSCULAR | Status: DC | PRN
  Administered 2024-10-29: 4 mg via INTRAVENOUS
  Filled 2024-10-29: qty 2

## 2024-10-29 NOTE — Anesthesia Postprocedure Evaluation (Signed)
"   Anesthesia Post Note  Patient: JESSIAH STEINHART  Procedure(s) Performed: LUMBAR TWO TO LUMBAR THREE AND LUMBAR THREE TO LUMBAR FOUR DECOMPRESSION AND DISCECTOMY WITH IN SITU FUSION     Patient location during evaluation: PACU Anesthesia Type: General Level of consciousness: awake and alert, oriented and patient cooperative Pain management: pain level controlled Vital Signs Assessment: post-procedure vital signs reviewed and stable Respiratory status: spontaneous breathing, nonlabored ventilation and respiratory function stable Cardiovascular status: blood pressure returned to baseline and stable Postop Assessment: no apparent nausea or vomiting Anesthetic complications: no   There were no known notable events for this encounter.  Last Vitals:  Vitals:   10/29/24 1600 10/29/24 1615  BP: (!) 160/95 (!) 158/86  Pulse: 86 79  Resp: 13 10  Temp:    SpO2: 95% 94%    Last Pain:  Vitals:   10/29/24 1559  TempSrc:   PainSc: [address] Wlliam Grosso      "

## 2024-10-29 NOTE — OR Nursing (Signed)
 1310 - RADIOLOGIST LIMIN XU CALLED AND CONFIRMD L3-L4

## 2024-10-29 NOTE — Discharge Instructions (Signed)

## 2024-10-29 NOTE — Anesthesia Procedure Notes (Signed)
 Procedure Name: Intubation Date/Time: 10/29/2024 12:10 PM  Performed by: Scherrie Mast, CRNAPre-anesthesia Checklist: Patient identified, Emergency Drugs available, Suction available and Patient being monitored Patient Re-evaluated:Patient Re-evaluated prior to induction Oxygen Delivery Method: Circle System Utilized Preoxygenation: Pre-oxygenation with 100% oxygen Induction Type: IV induction Ventilation: Mask ventilation without difficulty Laryngoscope Size: Glidescope, Mac and 3 Grade View: Grade I Tube type: Oral Tube size: 7.0 mm Number of attempts: 1 Airway Equipment and Method: Stylet and Oral airway Placement Confirmation: ETT inserted through vocal cords under direct vision, positive ETCO2 and breath sounds checked- equal and bilateral Secured at: 21 cm Tube secured with: Tape Dental Injury: Teeth and Oropharynx as per pre-operative assessment

## 2024-10-29 NOTE — Brief Op Note (Signed)
 06/25/2024   10:23 AM   PATIENT:  10/29/2024  2:53 PM  PATIENT:  Steve Hunter  79 y.o. male  PRE-OPERATIVE DIAGNOSIS:  spinal stenosis of lumbar region with neurogenic claudication  POST-OPERATIVE DIAGNOSIS:  spinal stenosis of lumbar region with neurogenic claudication  PROCEDURE:  Procedures with comments: LUMBAR TWO TO LUMBAR THREE AND LUMBAR THREE TO LUMBAR FOUR DECOMPRESSION AND DISCECTOMY WITH IN SITU FUSION (N/A) - L2-L4 DECOMPRESSION WITH IN SITU FUSION  SURGEON:  Surgeons and Role:    DEWAINE Burnetta Aures, MD - Primary  PHYSICIAN ASSISTANT:  Jeoffrey Sages, PA  ANESTHESIA:   general  EBL:  100 mL   BLOOD ADMINISTERED:none  DRAINS: none   LOCAL MEDICATIONS USED:  MARCAINE      SPECIMEN:  No Specimen  DISPOSITION OF SPECIMEN:  N/A  COUNTS:  YES  TOURNIQUET:  * No tourniquets in log *  DICTATION: .Dragon Dictation  PLAN OF CARE: Admit to inpatient   PATIENT DISPOSITION:  PACU - hemodynamically stable.

## 2024-10-29 NOTE — H&P (Signed)
 "   History:  Steve Hunter continues to have severe debilitating neuropathic leg pain right worse than the left. The patient has been unable to perform activities of daily living for the last month. The severity of his pain has intensified and as a result presents today to move forward with surgical intervention.  Past Medical History:  Diagnosis Date   Anxiety disorder    Aortic stenosis    Mild   Cancer (HCC)    Bladder   Chronic back pain    Diabetes mellitus    Type II   GERD (gastroesophageal reflux disease)    History of hiatal hernia    Hypercholesterolemia    Hypertension    Melanoma (HCC)    Multiple thyroid  nodules    Osteoarthritis    Sleep apnea    pt states he no longer has this- he had surgery (tonsillectomy) and it resolved    Allergies[1]  Medications Ordered Prior to Encounter[2]  Physical Exam: Clinical exam: Steve Hunter is a pleasant individual, who appears younger than their stated age.   He is alert and orientated 3.   No shortness of breath, chest pain.   Abdomen is soft and non-tender, patient currently has a suprapubic tube for chronic bladder issues. Denies any incontinence of bowel but he does complain of constipation. no rebound tenderness.   Negative: skin lesions abrasions contusions  Peripheral pulses: 2+ peripheral pulses bilaterally.  LE compartments are: Soft and nontender.  Gait pattern: Altered gait pattern due to severe back buttock and bilateral neuropathic leg pain.  Assistive devices: walker  Neuro: In the seated position: 4/5 motor strength in the lower extremity. Negative straight leg raise test, no clonus, negative Babinski test. Intermittent dysesthesias bilaterally with the right being worse than the left.  Musculoskeletal: Severe back pain radiating into the lower extremity. Pain is midline radiating well-healed surgical scar from previous two-level lumbar fusion. Pain is improved with forward flexion of the lumbar spine. No hip,  knee, ankle pain with isolated joint range of motion.  Imaging: X-rays taken of the cervical spine demonstrate solid C3-4 fusion which I performed approximately a year and a half ago. X-rays of the lumbar spine demonstrate a solid L4-S1 instrumented fusion which was performed several years ago. There is adjacent segment degenerative disease L2-4.   Lumbar MRI: completed on 09/15/2024. Mild progression of spinal stenosis at L2-3. There is a right paracentral disc protrusion causing significant foraminal and lateral recess stenosis affecting the exiting L2 and traversing L3 nerve root. Moderate central canal stenosis. There is been significant progression of the spinal stenosis at L3-4. There is moderate to severe disc narrowing similar to L2-3. Significant facet arthropathy. Moderate to severe central canal stenosis and severe right lateral recess stenosis. There is redundancy of the nerve roots above the stenosis. No significant stenosis L4-5 or L5-S1. Fusion appears to be stable. No cysts scoliosis or spondylolisthesis noted.  A/P:  Steve Hunter is a very pleasant 79 year old gentleman who has had acute decompensation secondary to lumbar spinal stenosis with neurogenic claudication. The patient has primary complaints of neurogenic claudication that is affecting his ability to walk and function. His neuropathic pain has worsened since his last office visit with me. The MRI clearly shows adjacent segment spinal stenosis L2-3 and L3-4 which would produce the neurogenic claudication. I have gone over the imaging studies with the patient and his daughter and all their questions were addressed.  At this point I think the best course of action is a lumbar decompression  L2-3 and L3-4. This would address the neurogenic claudication and spinal stenosis. Due to the significant foraminal stenosis more than likely there is also a need to do a more extensive right-sided foraminal decompression at these 2 levels. Given this  and the fact that he has a prior two-level fusion I would recommend doing an in situ fusion L2-4 so that we do not create iatrogenic instability that would necessitate instrumentation in the future.  I have reviewed the risks and benefits as well as the alternatives to surgery with the patient and his daughter and all of their questions were addressed. The primary goal of surgery is to reduce his pain and improve his quality of life. The patient denies any significant back pain and I have reiterated to him that the goal here is to reduce the neuropathic leg pain especially on the right side. He is in agreement with the plan. Risks and benefits of lumbar decompression/discectomy: Infection, bleeding, death, stroke, paralysis, ongoing or worse pain, need for additional surgery, leak of spinal fluid, adjacent segment degeneration requiring additional surgery, post-operative hematoma formation that can result in neurological compromise and the need for urgent/emergent re-operation. Loss in bowel and bladder control. Injury to major vessels that could result in the need for urgent abdominal surgery to stop bleeding. Risk of deep venous thrombosis (DVT) and the need for additional treatment. Recurrent disc herniation resulting in the need for revision surgery, which could include fusion surgery (utilizing instrumentation such as pedicle screws and intervertebral cages).      [1]  Allergies Allergen Reactions   Lexiscan  [Regadenoson ] Itching and Rash    Patient developed mosquite bite type rash on chest, back and leg post lexiscan .   Vibegron     Other Reaction(s): Other (See Comments)  Dry mouth, urinary retention  [2]  No current facility-administered medications on file prior to encounter.   Current Outpatient Medications on File Prior to Encounter  Medication Sig Dispense Refill   aspirin EC 81 MG tablet Take 81 mg by mouth daily.     atorvastatin  (LIPITOR) 40 MG tablet Take 40 mg by mouth daily.      CELEBREX 200 MG capsule Take 200 mg by mouth daily as needed for moderate pain (pain score 4-6).     HYDROcodone-acetaminophen  (NORCO/VICODIN) 5-325 MG tablet Take 1 tablet by mouth 3 (three) times daily as needed for moderate pain (pain score 4-6).     LORazepam  (ATIVAN ) 1 MG tablet Take 1 mg by mouth at bedtime.     losartan  (COZAAR ) 25 MG tablet Take 25 mg by mouth daily.     lubiprostone (AMITIZA) 24 MCG capsule Take 24 mcg by mouth daily as needed for constipation.     metFORMIN  (GLUCOPHAGE -XR) 500 MG 24 hr tablet Take 500 mg by mouth 2 (two) times daily with a meal.     nortriptyline  (PAMELOR ) 25 MG capsule Take 25 mg by mouth at bedtime.     pantoprazole  (PROTONIX ) 40 MG tablet Take 40 mg by mouth 2 (two) times daily.     Polyethyl Glycol-Propyl Glycol 0.4-0.3 % SOLN Place 1 drop into both eyes 3 (three) times daily as needed (dry eyes).     polyethylene glycol (MIRALAX  / GLYCOLAX ) 17 g packet Take 17 g by mouth daily as needed for moderate constipation.     Accu-Chek Softclix Lancets lancets USE AS DIRECTED 2-3 TIMES DAILY     cephALEXin  (KEFLEX ) 500 MG capsule Take 1 capsule (500 mg total) by mouth 4 (four) times  daily. (Patient not taking: Reported on 10/23/2024) 28 capsule 0   glucose blood (ACCU-CHEK AVIVA PLUS) test strip USE AS DIRECTED 2-3 TIMES DAILY     linaclotide  (LINZESS ) 72 MCG capsule Take 72 mcg by mouth daily as needed (constipation). (Patient not taking: Reported on 10/23/2024)     ondansetron  (ZOFRAN ) 4 MG tablet Take 1 tablet (4 mg total) by mouth every 8 (eight) hours as needed for nausea or vomiting. (Patient not taking: Reported on 10/23/2024) 20 tablet 0   oxyCODONE -acetaminophen  (PERCOCET/ROXICET) 5-325 MG tablet Take 1-2 tablets by mouth every 6 (six) hours as needed for severe pain (pain score 7-10). (Patient not taking: Reported on 10/23/2024) 6 tablet 0   "

## 2024-10-29 NOTE — Progress Notes (Signed)
 PHARMACIST - PHYSICIAN COMMUNICATION DR:  [place]  CONCERNING:  METFORMIN  SAFE ADMINISTRATION POLICY  RECOMMENDATION: Metformin  has been placed on DISCONTINUE (rejected order) STATUS and should be reordered only after any of the conditions below are ruled out.    DESCRIPTION:  The Pharmacy Committee has adopted a policy that restricts the use of metformin  in hospitalized patients until all the contraindications to administration have been ruled out:  []  eGFR below 30 mL/min/1.55m2 []  Acute or chronic metabolic acidosis (including DKA) []  Shock, acute MI, sepsis, hypoxemia, dehydration []  Administration of intra-arterial iodinated contrast: impending, or completed within past 48 hours* [x]  Administration of intravenous iodinated contrast: impending, or completed within past 48 hours, IF any of the following risk factors are also present**: eGFR 30 - 60 mL/min/1.48m2  history of hepatic impairment, alcoholism, or heart failure  ** When metformin  is stopped due to administration of iodinated contrast, recommend re-evaluating eGFR 48 hours after the imaging procedure and restarting metformin  if renal function is stable and no other contraindications are present.  Note IV contrast given 1/7 at 21:57 PM.  eGFR 52 mL/min/1.73m2.  Harlene Boga, PharmD Please refer to [facility] for [facility] numbers 10/29/2024 6:41 PM

## 2024-10-29 NOTE — Transfer of Care (Signed)
 Immediate Anesthesia Transfer of Care Note  Patient: Steve Hunter  Procedure(s) Performed: LUMBAR TWO TO LUMBAR THREE AND LUMBAR THREE TO LUMBAR FOUR DECOMPRESSION AND DISCECTOMY WITH IN SITU FUSION  Patient Location: PACU  Anesthesia Type:General  Level of Consciousness: awake, alert , oriented, and patient cooperative  Airway & Oxygen Therapy: Patient Spontanous Breathing  Post-op Assessment: Report given to RN, Post -op Vital signs reviewed and stable, and Patient moving all extremities X 4  Post vital signs: Reviewed and stable  Last Vitals:  Vitals Value Taken Time  BP 182/97 10/29/24 15:30  Temp    Pulse 90 10/29/24 15:31  Resp 15 10/29/24 15:31  SpO2 95 % 10/29/24 15:31  Vitals shown include unfiled device data.  Last Pain:  Vitals:   10/29/24 0949  TempSrc:   PainSc: 8       Patients Stated Pain Goal: 2 (10/29/24 1014)  Complications: No notable events documented.

## 2024-10-29 NOTE — Op Note (Signed)
 OPERATIVE REPORT  DATE OF SURGERY: 10/29/2024  PATIENT NAME:  Steve Hunter MRN: [id number] DOB: [birth year]  PCP: Tanda Prentice DEL, MD  PRE-OPERATIVE DIAGNOSIS: Lumbar spinal stenosis with neurogenic claudication L2-4.  Prior L4-S1 instrumented fusion  POST-OPERATIVE DIAGNOSIS: Same  PROCEDURE:   Lumbar decompression L2-4 with medial facetectomy and foraminotomy. In situ fusion L2-4 with autograft bone  SURGEON:  Donaciano Sprang, MD  PHYSICIAN ASSISTANT: Jeoffrey Sages, PA  ANESTHESIA:   General  EBL: 100 ml   Complications: None  Graft: Autograft from decompression  BRIEF HISTORY: Steve Hunter is a 79 y.o. male who several years ago had a lumbar instrumented fusion L4-S1.  Patient has done well until recently when he had significant back buttock and radicular leg pain.  Imaging demonstrated adjacent segment spinal stenosis at L2-3 and L3-4.  Conservative treatment failed to provide any relief and as result we elected to move forward with surgery.  Risks/benefits/alternatives were discussed and consent was obtained.  PROCEDURE DETAILS: Patient was brought into the operating room and was properly positioned on the operating room table.  After induction with general anesthesia the patient was endotracheally intubated.  A timeout was taken to confirm all important data: including patient, procedure, and the level. Teds, SCD's were applied.   Patient was turned prone onto the Wilson frame.  The suprapubic catheter was padded and well protected.  All bony promises well-padded and the patient was placed into kyphosis on the Wilson frame.  The back was then prepped and draped in a standard fashion.  2 needles were then placed in the back and an x-ray was taken for localization of our incision.  I marked out his previous midline incision and extended cephalad.  I infiltrated it with quarter percent Marcaine  with epinephrine  and made a midline incision.  Sharp dissection was carried out down to  the deep fascia.  I incised the deep fascia and dissected along the spinous process of L2.  I then stripped the paraspinal muscles and expose the L2 spinous process and the L2-3 facet complex.  I then proceeded cephalad dissecting through the scar tissue until I could visualize the L3 spinous process.  Based on his preoperative imaging I needed the L3 spinous process and the superior portion of the L4 spinous process were still intact.  I ultimately dissected the soft tissue and scar tissue to expose the L3 spinous process as well as the L3-4 interspinous process space.  The L3-4 facet complex was exposed.  A Penfield 4 was placed underneath the L3 lamina and an intraoperative x-ray was taken and read by the radiologist.  This confirmed that we are at the appropriate level.  Double-action Leksell rongeur was used to remove the entire spinous process of L3.  I then used 3 mm Kerrison rongeur to perform a generous laminotomy bilaterally and central decompression.  I then dissected through the ligamentum flavum until I could create a plane between the thecal sac and the ligamentum flavum.  I then used my Kerrison rongeurs to remove the ligamentum flavum.  At this level it was quite significant.  It was marked stenosis consistent with what was seen on the preoperative MRI.  Careful dissection with the Penfield 4 allowed me to create my plane and protect the thecal sac and prevent a durotomy.  Once the ligamentum flavum was removed centrally I then worked into the lateral recess.  Ultimately I was able to identify the L3-4 disc space and the L4 nerve root.  I traced  the L4 nerve root into the foramen.  My medial facetectomy was performed as well as my medial foraminotomy.  I then could palpate the medial border of the L4 pedicle.  Once I could visualize and pack palpate the pedicle I do that my decompression was complete.  I then proceeded superiorly.  Once this side was completed I switched sides and perform a similar  decompression on the contralateral side.  Once the L3-4 decompression was complete I then proceeded to the L2-3 level.  I remove the inferior third of the spinous process of L2 and then used my Kerrison rongeur to perform a laminotomy of L2 bilaterally.  Again there was thickening of the ligamentum flavum causing stenosis consistent with what was seen on the MRI.  Ultimately I dissected through the ligamentum flavum and resected it.  I could now visualize the dorsal surface of the thecal sac at the L2-3 level.  Using a [facility] I dissected inferiorly underneath the remaining portion of the L3 lamina.  Once I was able to pass the neural padding underneath the L3 lamina I completed my laminectomy of L3 with a Kerrison rongeur.  Once this was completed I then could then work into the lateral recess.  I again performed a medial facetectomy at L2 similar to what was done at L2-3.  Ultimately I took my dissection out to the medial border of the pedicle of L2.  Foraminotomies at L5 2 was then performed with the Kerrison rongeur's.  I could now easily pass my nerve hook inferiorly out the L4 foramen, along the lateral recess at L3-4 and out the L3 foramen.  I could also palpate into the lateral recess at L2-3 and ultimately palpate the inferior aspect of the L2 pedicle.  I placed my probes and took a final x-ray confirming that my decompression spanned the maximum area of stenosis is seen on the preoperative MRI.  Once I had a bilateral decompression at L2-3 complete I then palpated both sides to ensure an adequate decompression.  Once the central and lateral recess decompression proved satisfactory as were the foraminal decompressions I then proceeded with the in situ fusion.  The lateral aspect of the L2-3 and L3-4 facet complexes were burred and I burred the L2 and L3 transverse processes.  I then dissected inferiorly until I could see the L4 pedicle screw.  I then decorticated and around the lateral aspect of  the L4 pedicle screw.  I then placed my bone graft at L3-4 and L2-3 in the lateral recess to facilitate the fusion.  This was done bilaterally.  After the autograft bone was placed I irrigated the wound copiously normal saline and then using bipolar cautery I confirmed hemostasis.  Thrombin -soaked Gelfoam patty was placed over the laminotomy defect.  I then irrigated again copiously with normal saline and then placed vancomycin  powder in the deep fascia.  The deep fascia was then closed with interrupted #1 Vicryl sutures.  Additional vancomycin  powder was placed superficially and then superficial was closed with interrupted 2-0 Vicryl's.  The skin was then reapproximated with a 3-0 Monocryl.  Steri-Strips dry dressing were applied and the patient was ultimately extubated and transferred to the PACU without incident.  The end of the case all needle sponge counts were correct.  There were no adverse intraoperative events.  Donaciano Sprang, MD 10/29/2024 2:42 PM

## 2024-10-30 ENCOUNTER — Encounter (HOSPITAL_COMMUNITY): Payer: Self-pay | Admitting: Orthopedic Surgery

## 2024-10-30 ENCOUNTER — Other Ambulatory Visit: Payer: Self-pay

## 2024-10-30 LAB — GLUCOSE, CAPILLARY
Glucose-Capillary: 160 mg/dL — ABNORMAL HIGH (ref 70–99)
Glucose-Capillary: 141 mg/dL — ABNORMAL HIGH (ref 70–99)
Glucose-Capillary: 143 mg/dL — ABNORMAL HIGH (ref 70–99)

## 2024-10-30 NOTE — Evaluation (Signed)
 Occupational Therapy Evaluation Patient Details Name: Steve Hunter MRN: [id number] DOB: Dec 20, 1945 Today's Date: 10/30/2024   History of Present Illness   79 y.o. male admitted 1/8 for elective lumbar surgery. S/p L2-3, L3-4 lumbar decompression and discectomy fusion on 1/8. PMH:  former smoker (quit 07/02/80), HTN, HLD, CKD 3, GERD on PPI, non-insulin -dependent DM2, OSA (s/p uvulectomy; does not use CPAP), bladder cancer (s/p TURBT 2001; s/p Urolift and bladder biopsy/fulguration 07/18/22), anxiety, chronic back pain, spinal surgery (L4-S1 PLIF 04/10/16; C3-4 ACDF 03/21/23).     Clinical Impressions At baseline, pt requires Set up to Contact guard assist with ADLs, receives assist for IADLs, and performs functional mobility Mod I with a RW. Pt with a history of falls. Pt now presents with decreased activity tolerance, decreased balance, decreased knowledge of back precautions, decreased knowledge of AE/DME, impaired cognition (at or near baseline per daughter), and decreased safety and independence with functional tasks. Pt currently demonstrating ability to complete ADLs largely with Set up to Mod assist, bed mobility with Contact guard to Min assist, and functional transfers/mobility with a RW with Contact guard assist, all while adhering to back precautions. Pt currently requiring cues to adhere to precautions during ADLs and requiring cues for safety, compensatory strategies, and technique with RW. Pt participated well in session. VSS on RA. Pt will benefit from acute OT services to address deficits and increase safety and independence with functional tasks. Post acute discharge, pt will benefit from [facility] OT, including a home safety assessment, to reinforce education in performing ADLs while adhering to back precautions, decrease risk of falls, and decrease risk of rehospitalization.    If plan is discharge home, recommend the following:   A little help with walking and/or transfers;A lot of help  with bathing/dressing/bathroom;Assistance with cooking/housework;Direct supervision/assist for medications management;Direct supervision/assist for financial management;Assist for transportation;Help with stairs or ramp for entrance     Functional Status Assessment   Patient has had a recent decline in their functional status and demonstrates the ability to make significant improvements in function in a reasonable and predictable amount of time.     Equipment Recommendations   BSC/3in1     Recommendations for Other Services         Precautions/Restrictions   Precautions Precautions: Back;Fall Precaution Booklet Issued: Yes (comment) Recall of Precautions/Restrictions: Impaired Precaution/Restrictions Comments: Needs cues to maintain Required Braces or Orthoses: Spinal Brace Spinal Brace: Lumbar corset;Applied in sitting position Restrictions Other Position/Activity Restrictions: back     Mobility Bed Mobility Overal bed mobility: Needs Assistance Bed Mobility: Rolling, Sidelying to Sit, Sit to Sidelying Rolling: Contact guard assist Sidelying to sit: Contact guard assist     Sit to sidelying: Min assist General bed mobility comments: CGA for safety to roll; pt with overall good carryover of training from prior PT session in log roll technique for back precautions. Contact guard assist for trunk support to rise. Min assist to elevate B LE into bed with use of log roll method.    Transfers Overall transfer level: Needs assistance Equipment used: Rolling walker (2 wheels) Transfers: Sit to/from Stand, Bed to chair/wheelchair/BSC Sit to Stand: Contact guard assist, Min assist     Step pivot transfers: Contact guard assist     General transfer comment: CGA for safety performed from bed x1 and Min assist to boost up from chair placed in front of sink for grooming tasks. CGA for safety once in standing. Most stable with technique keeping Lt hand on bed to push up  and  Rt hand on RW to steady during transitions.      Balance Overall balance assessment: Needs assistance Sitting-balance support: No upper extremity supported, Feet supported Sitting balance-Leahy Scale: Fair Sitting balance - Comments: sitting EOB   Standing balance support: Single extremity supported, Bilateral upper extremity supported, During functional activity, Reliant on assistive device for balance Standing balance-Leahy Scale: Poor                             ADL either performed or assessed with clinical judgement   ADL Overall ADL's : Needs assistance/impaired Eating/Feeding: Set up;Sitting   Grooming: Oral care;Wash/dry hands;Set up;Sitting;Contact guard assist;Standing (adhering to back precautions) Grooming Details (indicate cue type and reason): Initially standing at sink for grooming tasks, but with pt fatiguing quickly and requesting to sit down. Grooming tasks completed at sink in sitting with Set up. Upper Body Bathing: Contact guard assist;Minimal assistance;Sitting;Cueing for compensatory techniques (cues for back precautions; adhering to back precautions)   Lower Body Bathing: Moderate assistance;Cueing for compensatory techniques;Sit to/from stand;Adhering to back precautions;Cueing for safety;Set up;Supervison/ safety (cues for back precautions; adhering to back precautions) Lower Body Bathing Details (indicate cue type and reason): able to simulate washing /drying feet with Set-up/Supervision bringing feet up to opposite knee in sitting; Mod assist needed in standing for peri care Upper Body Dressing : Contact guard assist;Minimal assistance;Sitting;Cueing for compensatory techniques (cues for back precautions; adhering to back precautions)   Lower Body Dressing: Supervision/safety;Set up;Sitting/lateral leans;Moderate assistance;Sit to/from stand;Cueing for back precautions;Adhering to back precautions;Cueing for compensatory techniques;Cueing for  safety Lower Body Dressing Details (indicate cue type and reason): able to don/doff socks in sitting with Set up-Supervision by bringing feet up to opposite knee in sitting; Mod assist needed in standing for peri care Toilet Transfer: Contact guard assist;Cueing for safety;Ambulation;BSC/3in1;Rolling walker (2 wheels) Toilet Transfer Details (indicate cue type and reason): simulated bed <-> chair; adhering to back precautions; cues for technique with RW Toileting- Clothing Manipulation and Hygiene: Moderate assistance;Sit to/from stand;Cueing for compensatory techniques;Cueing for back precautions;Adhering to back precautions Toileting - Clothing Manipulation Details (indicate cue type and reason): simulated     Functional mobility during ADLs: Contact guard assist;Rolling walker (2 wheels);Cueing for safety (cues for technique with RW) General ADL Comments: Pt with decreased activity tolerance, fatiguing quickly during tasks     Vision Baseline Vision/History: 1 Wears glasses Ability to See in Adequate Light: 0 Adequate (with glasses) Patient Visual Report: No change from baseline Additional Comments: Vision [facility] for tasks assessed; not formally screened or evaluated     Perception         Praxis         Pertinent Vitals/Pain Pain Assessment Pain Assessment: Faces Faces Pain Scale: Hurts little more Pain Location: lower back Pain Descriptors / Indicators: Operative site guarding, Sore, Discomfort Pain Intervention(s): Limited activity within patient's tolerance, Monitored during session, Repositioned     Extremity/Trunk Assessment Upper Extremity Assessment Upper Extremity Assessment: Right hand dominant;Overall [facility] for tasks assessed (tested within precautions; gross B UE strength 4/5)   Lower Extremity Assessment Lower Extremity Assessment: Defer to PT evaluation   Cervical / Trunk Assessment Cervical / Trunk Assessment: Back Surgery   Communication  Communication Communication: No apparent difficulties   Cognition Arousal: Alert Behavior During Therapy: WFL for tasks assessed/performed Cognition: Cognition impaired     Awareness: Intellectual awareness intact, Online awareness impaired Memory impairment (select all impairments): Short-term memory, Working memory Attention  impairment (select first level of impairment): Alternating attention Executive functioning impairment (select all impairments): Organization, Problem solving, Reasoning OT - Cognition Comments: Pt AAOx4 and pleasant throughout session with cognitive deficits noted above. Per daughter, pt at or very near baseline cognition.                 Following commands: Intact       Cueing  General Comments   Cueing Techniques: Verbal cues;Gestural cues  Daughter present and supportive throughout session.   Exercises     Shoulder Instructions      Home Living Family/patient expects to be discharged to:: Private residence Living Arrangements: Spouse/significant other Available Help at Discharge: Family Type of Home: House Home Access: Stairs to enter [employer] of Steps: 3 Entrance Stairs-Rails: Right;Left;Can reach both Home Layout: One level     Bathroom Shower/Tub: Tub/shower unit;Walk-in shower (Pt reports he typically uses walk-in shower)   Bathroom Toilet: Standard     Home Equipment: [employer] (2 wheels);Shower seat;Cane - single point;Wheelchair - manual;Hand held shower head   Additional Comments: Per daughter, pt usually sleeps in recliner      Prior Functioning/Environment Prior Level of Function : History of Falls (last six months);Needs assist             Mobility Comments: Reports using RW but + falls. ADLs Comments: Reports Supervision to Contact guard assist for showering, Set-up for dressing and grooming, and Set up to Contact guard assist for peri care after a BM. Pt reports he manages his own medicaiton  with daughter reporting she plans to start setting up a pill planner for pt and monitoring pt's medications more closely. Pt reports assistance for meal prep, home management tasks, and driving.    OT Problem List: Decreased strength;Decreased activity tolerance;Impaired balance (sitting and/or standing);Decreased safety awareness;Decreased knowledge of use of DME or AE;Decreased knowledge of precautions   OT Treatment/Interventions: Self-care/ADL training;Therapeutic exercise;Energy conservation;DME and/or AE instruction;Therapeutic activities;Patient/family education;Balance training;Cognitive remediation/compensation      OT Goals(Current goals can be found in the care plan section)   Acute Rehab OT Goals Patient Stated Goal: to recover well and return home OT Goal Formulation: With patient/family Time For Goal Achievement: 11/13/24 Potential to Achieve Goals: Good ADL Goals Pt Will Perform Grooming: standing;with set-up (adhering to back precautions) Pt Will Perform Upper Body Bathing: with supervision;with contact guard assist;sitting (adhering to back precautions) Pt Will Perform Lower Body Bathing: with contact guard assist;sit to/from stand (adhering to back precautions) Pt Will Perform Upper Body Dressing: with supervision;with set-up;sitting (adhering to back precautions) Pt Will Transfer to Toilet: with supervision;ambulating;bedside commode (with least restrictive AD; adhering to back precautions) Pt Will Perform Toileting - Clothing Manipulation and hygiene: with contact guard assist;sit to/from stand (adhering to back precautions)   OT Frequency:  Min 2X/week    Co-evaluation              AM-PAC OT 6 Clicks Daily Activity     Outcome Measure Help from another person eating meals?: A Little Help from another person taking care of personal grooming?: A Little Help from another person toileting, which includes using toliet, bedpan, or urinal?: A Lot Help from  another person bathing (including washing, rinsing, drying)?: A Lot Help from another person to put on and taking off regular upper body clothing?: A Little Help from another person to put on and taking off regular lower body clothing?: A Lot 6 Click Score: 15   End of Session Equipment Utilized  During Treatment: Gait belt;Rolling walker (2 wheels);Back brace Nurse Communication: Mobility status;Precautions  Activity Tolerance: Patient tolerated treatment well;No increased pain Patient left: in bed;with call bell/phone within reach;with bed alarm set;with family/visitor present  OT Visit Diagnosis: Other abnormalities of gait and mobility (R26.89);Unsteadiness on feet (R26.81);History of falling (Z91.81);Other symptoms and signs involving cognitive function                Time: 1419-1451 OT Time Calculation (min): 32 min Charges:  OT General Charges $OT Visit: 1 Visit OT Evaluation $OT Eval Moderate Complexity: 1 Mod OT Treatments $Self Care/Home Management : 8-22 mins  Margarie Rockey HERO., OTR/L, MA Acute Rehab [phone]   Margarie FORBES Horns 10/30/2024, 4:01 PM

## 2024-10-30 NOTE — Evaluation (Signed)
 Physical Therapy Evaluation Patient Details Name: Steve Hunter MRN: [id number] DOB: 13-Dec-1945 Today's Date: 10/30/2024  History of Present Illness  79 y.o. male admitted 1/8 for elective lumbar surgery. S/p L2-3, L3-4 lumbar decompression and discectomy fusion on 1/8. PMH:  former smoker (quit 07/02/80), HTN, HLD, CKD 3, GERD on PPI, non-insulin -dependent DM2, OSA (s/p uvulectomy; does not use CPAP), bladder cancer (s/p TURBT 2001; s/p Urolift and bladder biopsy/fulguration 07/18/22), anxiety, chronic back pain, spinal surgery (L4-S1 PLIF 04/10/16; C3-4 ACDF 03/21/23).    Clinical Impression  Patient is s/p above surgery presenting with functional limitations due to the deficits listed below (see PT Problem List). Previously ambulatory with a walker but reports several falls PTA. His wife provides set-up for ADLs and IADLs. Currently requires Min assist for bed mobility. CGA for transfers with RW for support, and supervision to ambulate with RW. Reviewed precautions, handout provided, educated on exercises, safety, and precautions. Patient will benefit from acute skilled PT to increase their independence and safety with mobility to facilitate discharge.        If plan is discharge home, recommend the following: A little help with walking and/or transfers;A little help with bathing/dressing/bathroom;Assistance with cooking/housework;Assist for transportation;Help with stairs or ramp for entrance   Can travel by private vehicle        Equipment Recommendations None recommended by PT  Recommendations for Other Services  OT consult    Functional Status Assessment Patient has had a recent decline in their functional status and demonstrates the ability to make significant improvements in function in a reasonable and predictable amount of time.     Precautions / Restrictions Precautions Precautions: Back;Fall Precaution Booklet Issued: Yes (comment) Recall of Precautions/Restrictions:  Impaired Precaution/Restrictions Comments: Needs cues to maintain Required Braces or Orthoses: Spinal Brace Spinal Brace: Lumbar corset;Applied in sitting position Restrictions Weight Bearing Restrictions Per Provider Order: No      Mobility  Bed Mobility Overal bed mobility: Needs Assistance Bed Mobility: Rolling, Sidelying to Sit Rolling: Contact guard assist Sidelying to sit: Min assist       General bed mobility comments: CGA for safety to roll, reviewing log roll technique for back precautions. Min assist for trunk support to rise. Dtr informs therapist that pt actually sleeps in recliner.    Transfers Overall transfer level: Needs assistance Equipment used: Rolling walker (2 wheels) Transfers: Sit to/from Stand Sit to Stand: Contact guard assist           General transfer comment: CGA for safety performed from bed x3, and recliner x1. Most stable with technique keeping Lt hand on bed to push up and Rt hand on RW to steady during transitions.    Ambulation/Gait Ambulation/Gait assistance: Supervision Gait Distance (Feet): 110 Feet Assistive device: Rolling walker (2 wheels) Gait Pattern/deviations: Step-through pattern, Decreased stride length, Trunk flexed, Narrow base of support, Knee flexed in stance - right, Knee flexed in stance - left Gait velocity: dec Gait velocity interpretation: <1.31 ft/sec, indicative of household ambulator Pre-gait activities: Weight shift, static march General Gait Details: Cues for upright posture, appropriate placement of RW to maximize support. No overt buckling noted, but knees fairly flexed throughout distance. Cues for safety awareness.  Stairs            Wheelchair Mobility     Tilt Bed    Modified Rankin (Stroke Patients Only)       Balance Overall balance assessment: Needs assistance Sitting-balance support: No upper extremity supported, Feet supported Sitting balance-Leahy Scale: Fair  Standing balance  support: During functional activity, Bilateral upper extremity supported Standing balance-Leahy Scale: Poor                               Pertinent Vitals/Pain Pain Assessment Pain Assessment: Faces Faces Pain Scale: Hurts little more Pain Location: back Pain Descriptors / Indicators: Operative site guarding Pain Intervention(s): Limited activity within patient's tolerance, Monitored during session, Repositioned    Home Living Family/patient expects to be discharged to:: Private residence Living Arrangements: Spouse/significant other Available Help at Discharge: Family Type of Home: House Home Access: Stairs to enter Entrance Stairs-Rails: Right;Left;Can reach both Entrance Stairs-Number of Steps: 3   Home Layout: One level Home Equipment: [employer] (2 wheels);Shower seat;Cane - single point;Wheelchair - manual      Prior Function Prior Level of Function : History of Falls (last six months);Needs assist             Mobility Comments: Reports using RW but + falls. ADLs Comments: Reports wife sets-up clothes/shower but he can perform himself.     Extremity/Trunk Assessment   Upper Extremity Assessment Upper Extremity Assessment: Defer to OT evaluation    Lower Extremity Assessment Lower Extremity Assessment: Generalized weakness       Communication   Communication Communication: No apparent difficulties    Cognition Arousal: Alert Behavior During Therapy: WFL for tasks assessed/performed   PT - Cognitive impairments: Memory, Safety/Judgement                         Following commands: Intact       Cueing Cueing Techniques: Verbal cues, Gestural cues     General Comments General comments (skin integrity, edema, etc.): Daughter present and supportive. Pt reports no further pain in LEs, only at surgical site.    Exercises General Exercises - Lower Extremity Ankle Circles/Pumps: AROM, Both, 10 reps, Seated Long Arc Quad:  Strengthening, Both, 10 reps, Seated   Assessment/Plan    PT Assessment Patient needs continued PT services  PT Problem List Decreased strength;Decreased range of motion;Decreased activity tolerance;Decreased balance;Decreased mobility;Decreased knowledge of use of DME;Decreased safety awareness;Decreased knowledge of precautions;Pain;Decreased coordination       PT Treatment Interventions DME instruction;Gait training;Stair training;Functional mobility training;Therapeutic activities;Therapeutic exercise;Balance training;Neuromuscular re-education;Cognitive remediation;Patient/family education;Modalities    PT Goals (Current goals can be found in the Care Plan section)  Acute Rehab PT Goals Patient Stated Goal: go home PT Goal Formulation: With patient/family Time For Goal Achievement: 11/13/24 Potential to Achieve Goals: Good    Frequency Min 3X/week     Co-evaluation               AM-PAC PT 6 Clicks Mobility  Outcome Measure Help needed turning from your back to your side while in a flat bed without using bedrails?: A Little Help needed moving from lying on your back to sitting on the side of a flat bed without using bedrails?: A Little Help needed moving to and from a bed to a chair (including a wheelchair)?: A Little Help needed standing up from a chair using your arms (e.g., wheelchair or bedside chair)?: A Little Help needed to walk in hospital room?: A Little Help needed climbing 3-5 steps with a railing? : A Little 6 Click Score: 18    End of Session Equipment Utilized During Treatment: Gait belt;Back brace Activity Tolerance: Patient tolerated treatment well Patient left: in chair;with call bell/phone within reach;with  chair alarm set;with family/visitor present Nurse Communication: Mobility status PT Visit Diagnosis: Unsteadiness on feet (R26.81);Other abnormalities of gait and mobility (R26.89);Muscle weakness (generalized) (M62.81);Repeated falls  (R29.6);History of falling (Z91.81);Difficulty in walking, not elsewhere classified (R26.2);Pain Pain - part of body:  (back)    Time: 8781-8750 PT Time Calculation (min) (ACUTE ONLY): 31 min   Charges:   PT Evaluation $PT Eval Low Complexity: 1 Low PT Treatments $Therapeutic Activity: 8-22 mins PT General Charges $$ ACUTE PT VISIT: 1 Visit         Steve Hunter, PT, DPT [facility]  Rehabilitation Services Physical Therapist Office: [phone] Website: [email or web address]   Steve Hunter 10/30/2024, 2:49 PM

## 2024-10-30 NOTE — Progress Notes (Addendum)
" ° ° °  Subjective: Procedures (LRB): LUMBAR TWO TO LUMBAR THREE AND LUMBAR THREE TO LUMBAR FOUR DECOMPRESSION AND DISCECTOMY WITH IN SITU FUSION (N/A) 1 Day Post-Op  Patient reports pain as mild.  Reports decreased leg pain reports incisional back pain   Positive void Negative bowel movement Positive flatus Negative chest pain or shortness of breath  Objective: Vital signs in last 24 hours: Temp:  [97.6 F (36.4 C)-98.2 F (36.8 C)] 97.6 F (36.4 C) (01/09 0339) Pulse Rate:  [78-105] 105 (01/09 0339) Resp:  [10-20] 18 (01/09 0609) BP: (141-182)/(74-97) 150/86 (01/09 0339) SpO2:  [94 %-96 %] 96 % (01/09 0339) Weight:  [79.4 kg] 79.4 kg (01/08 0927)  Intake/Output from previous day: 01/08 0701 - 01/09 0700 In: 1120 [P.O.:120; I.V.:800; IV Piggyback:200] Out: 1300 [Urine:1200; Blood:100]  Labs: Recent Labs    10/27/24 0918  WBC 9.5  RBC 4.05*  HCT 38.1*  PLT 363   Recent Labs    10/27/24 0918  NA 134*  K 4.5  CL 96*  CO2 28  BUN 16  CREATININE 1.39*  GLUCOSE 118*  CALCIUM  9.5   No results for input(s): LABPT, INR in the last 72 hours.  Physical Exam: Sensation intact distally Intact pulses distally Dorsiflexion/Plantar flexion intact Incision: dressing C/D/I No cellulitis present Compartment soft Body mass index is 23.09 kg/m.   Assessment/Plan: Aryan is a very pleasant 79 year old male who is POD1 from L2-L4 decompression with in situ fusion. Surgical intervention was successful and without complications. His hospital course has been uncomplicated. He did have some AFIB in PACU and was put on Tele but has maintained NSR. He states that his pre-operative leg pain is much improved since surgery. He is ambulating on his own. He is tolerating oral intake well. He is compliant with the LSO brace. He is complaint with the incentive spirometer. He reports that his pain is well controled with oral pain medications. Dressing is c/d/I.     Patient stable   xrays n/a Continue mobilization with physical therapy Continue care   Plan on d/c to home today or tomorrow pending PT clearance   Jeoffrey Sages PA-C for Dr. Donaciano Sprang, MD [facility] [phone]  "

## 2024-10-30 NOTE — Discharge Summary (Signed)
 "  Patient ID: Steve Hunter MRN: [id number] DOB/AGE: 80/21/1947 80 y.o.  Admit date: 10/29/2024 Discharge date: 10/30/2024  Admission Diagnoses:  Principal Problem:   Lumbar spinal stenosis   Discharge Diagnoses:  Principal Problem:   Lumbar spinal stenosis  status post Procedures: LUMBAR TWO TO LUMBAR THREE AND LUMBAR THREE TO LUMBAR FOUR DECOMPRESSION AND DISCECTOMY WITH IN SITU FUSION  Past Medical History:  Diagnosis Date   Anxiety disorder    Aortic stenosis    Mild   Cancer (HCC)    Bladder   Chronic back pain    Diabetes mellitus    Type II   GERD (gastroesophageal reflux disease)    History of hiatal hernia    Hypercholesterolemia    Hypertension    Melanoma (HCC)    Multiple thyroid  nodules    Osteoarthritis    Sleep apnea    pt states he no longer has this- he had surgery (tonsillectomy) and it resolved    Surgeries: Procedures: LUMBAR TWO TO LUMBAR THREE AND LUMBAR THREE TO LUMBAR FOUR DECOMPRESSION AND DISCECTOMY WITH IN SITU FUSION on 10/29/2024   Consultants:   Discharged Condition: Improved  Hospital Course: Steve Hunter is an 79 y.o. male who was admitted 10/29/2024 for operative treatment of Lumbar spinal stenosis. Patient failed conservative treatments (please see the history and physical for the specifics) and had severe unremitting pain that affects sleep, daily activities and work/hobbies. After pre-op clearance, the patient was taken to the operating room on 10/29/2024 and underwent  Procedures: LUMBAR TWO TO LUMBAR THREE AND LUMBAR THREE TO LUMBAR FOUR DECOMPRESSION AND DISCECTOMY WITH IN SITU FUSION.    Patient was given perioperative antibiotics:  Anti-infectives (From admission, onward)    Start     Dose/Rate Route Frequency Ordered Stop   10/29/24 2000  ceFAZolin  (ANCEF ) IVPB 1 g/50 mL premix        1 g 100 mL/hr over 30 Minutes Intravenous Every 8 hours 10/29/24 1832 10/30/24 1113   10/29/24 1421  vancomycin  (VANCOCIN ) powder  Status:   Discontinued          As needed 10/29/24 1421 10/29/24 1539   10/29/24 0933  ceFAZolin  (ANCEF ) IVPB 2g/100 mL premix        2 g 200 mL/hr over 30 Minutes Intravenous 30 min pre-op 10/29/24 0933 10/29/24 1242        Patient was given sequential compression devices and early ambulation to prevent DVT.   Patient benefited maximally from hospital stay and there were no complications. At the time of discharge, the patient was urinating/moving their bowels without difficulty, tolerating a regular diet, pain is controlled with oral pain medications and they have been cleared by PT/OT.   Recent vital signs: Patient Vitals for the past 24 hrs:  BP Temp Pulse Resp SpO2  10/30/24 0904 (!) 152/90 97.8 F (36.6 C) (!) 103 -- 99 %  10/30/24 0811 135/77 97.8 F (36.6 C) 96 -- 97 %  10/30/24 0609 -- -- -- 18 --  10/30/24 0339 (!) 150/86 97.6 F (36.4 C) (!) 105 20 96 %  10/30/24 0124 (!) 161/94 97.7 F (36.5 C) 93 20 96 %  10/29/24 2316 -- -- -- 19 --  10/29/24 2000 (!) 158/82 97.7 F (36.5 C) 87 20 96 %  10/29/24 1801 -- -- 78 17 94 %  10/29/24 1800 (!) 166/80 97.7 F (36.5 C) 79 11 95 %  10/29/24 1745 (!) 151/74 -- 83 14 95 %  10/29/24 1730 (!)  163/86 -- 85 18 95 %  10/29/24 1715 (!) 141/95 -- 78 14 94 %  10/29/24 1700 (!) 156/82 -- 84 17 95 %  10/29/24 1645 (!) 155/91 -- 80 13 95 %  10/29/24 1630 (!) 158/95 -- 78 13 94 %  10/29/24 1615 (!) 158/86 -- 79 10 94 %  10/29/24 1600 (!) 160/95 -- 86 13 95 %  10/29/24 1545 (!) 169/90 -- 99 16 96 %  10/29/24 1530 (!) 182/97 97.7 F (36.5 C) 92 20 94 %     Recent laboratory studies: No results for input(s): WBC, HGB, HCT, PLT, NA, K, CL, CO2, BUN, CREATININE, GLUCOSE, INR, CALCIUM  in the last 72 hours.  Invalid input(s): PT, 2   Discharge Medications:   Allergies as of 10/30/2024       Reactions   Lexiscan  [regadenoson ] Itching, Rash   Patient developed mosquite bite type rash on chest, back and leg post  lexiscan .   Vibegron    Other Reaction(s): Other (See Comments) Dry mouth, urinary retention     Med Rec must be completed prior to using this [facility]***       Diagnostic Studies: DG Lumbar Spine 1 View Result Date: 10/29/2024 EXAM: 1 LATERAL VIEW XRAY OF THE LUMBAR SPINE 10/29/2024 02:48:49 PM COMPARISON: Lumbar spine x-ray dated 10/29/2024. CLINICAL HISTORY: Surgery, elective. FINDINGS: LUMBAR SPINE: BONES: Vertebral body heights are maintained. Alignment is stable. There is 4 mm of retrolisthesis at L3-L4 and 4 mm of anterolisthesis at L4-L5. L4-L5 and L5-S1 posterior fusion hardware and disc spacers appear unchanged. Instrument projects posterior to the L3 vertebral body. No acute fracture identified. DISCS AND DEGENERATIVE CHANGES: There is moderate disc space narrowing at L2-L3 compatible with degenerative change. SOFT TISSUES: No acute abnormality. IMPRESSION: 1. Stable alignment with 4 mm retrolisthesis at L3-L4 and 4 mm anterolisthesis at L4-L5. 2. Moderate disc space narrowing at L2-L3, compatible with degenerative change. 3. Unchanged L4-S1 posterior fusion hardware and disc spacers. Electronically signed by: Greig Pique MD MD 10/29/2024 07:36 PM EST RP Workstation: [id number]   DG Lumbar Spine 2-3 Views Result Date: 10/29/2024 CLINICAL DATA:  Intraoperative localization EXAM: LUMBAR SPINE - 2 VIEW COMPARISON:  CT abdomen and pelvis dated 11/29/2023 FINDINGS: Two cross-table lateral views of the lumbar spine are provided for intraoperative localization. Prior L4-S1 spinal fusion hardware in-situ. Unchanged grade 1 retrolisthesis at L2-3, L3-4 and anterolisthesis at L4-5. Initial radiograph demonstrates posterior approach metallic needle localizers projecting over the posterior spinous elements at the level of inferior L1 and L2-3. Subsequent radiograph demonstrates retractor at the level of L3 with probe directed at L3-4. IMPRESSION: Two cross-table lateral views of the lumbar spine are  provided for intraoperative localization. These results were discussed by telephone on 10/29/2024 at 1:12 pm with provider [facility] BROOKS . Electronically Signed   By: Limin  Xu M.D.   On: 10/29/2024 13:12       Follow-up Information     Burnetta Aures, MD. Schedule an appointment as soon as possible for a visit in 2 week(s).   Specialty: Orthopedic Surgery Why: If symptoms worsen, For suture removal, For wound re-check Contact information: [address] [place] [address] [phone]                 Discharge Plan:  discharge to home today   Disposition: Steve Hunter is a very pleasant 79 year old male who is POD*** from L2-L4 decompression with in situ fusion. Surgical intervention was successful and without complications. His hospital course  has been uncomplicated. He did have some AFIB in PACU and was put on Tele but has maintained NSR. He also fell on 10/31/2023 while leaning forward on bed and he slid down the side of the bed without injury. He states that his pre-operative leg pain is much improved since surgery. He is ambulating on his own. He is tolerating oral intake well. He is compliant with the LSO brace. He is complaint with the incentive spirometer. He reports that his pain is well controled with oral pain medications. Dressing is c/d/I.  Positive void in suprapubic cath, positive flatus, positive BM. Patient will follow up with us  in clinic in 2 weeks. Patient understands that he cannot shower for the first 5 days post-op. All questions were welcomed and addressed.     Signed: Jeoffrey LOISE Sages for Dr. Donaciano Sprang [facility] [phone] 10/30/2024, 11:58 AM      "

## 2024-10-30 NOTE — Plan of Care (Signed)
 " Problem: Education: Goal: Knowledge of General Education information will improve Description: Including pain rating scale, medication(s)/side effects and non-pharmacologic comfort measures Outcome: Progressing   Problem: Health Behavior/Discharge Planning: Goal: Ability to manage health-related needs will improve Outcome: Progressing   Problem: Clinical Measurements: Goal: Ability to maintain clinical measurements within normal limits will improve Outcome: Progressing Goal: Will remain free from infection Outcome: Progressing Goal: Diagnostic test results will improve Outcome: Progressing Goal: Respiratory complications will improve Outcome: Progressing Goal: Cardiovascular complication will be avoided Outcome: Progressing   Problem: Activity: Goal: Risk for activity intolerance will decrease Outcome: Progressing   Problem: Nutrition: Goal: Adequate nutrition will be maintained Outcome: Progressing   Problem: Coping: Goal: Level of anxiety will decrease Outcome: Progressing   Problem: Elimination: Goal: Will not experience complications related to bowel motility Outcome: Progressing Goal: Will not experience complications related to urinary retention Outcome: Progressing   Problem: Pain Managment: Goal: General experience of comfort will improve and/or be controlled Outcome: Progressing   Problem: Safety: Goal: Ability to remain free from injury will improve Outcome: Progressing   Problem: Skin Integrity: Goal: Risk for impaired skin integrity will decrease Outcome: Progressing   Problem: Education: Goal: Knowledge of the prescribed therapeutic regimen will improve Outcome: Progressing   Problem: Bowel/Gastric: Goal: Gastrointestinal status for postoperative course will improve Outcome: Progressing   Problem: Cardiac: Goal: Ability to maintain an adequate cardiac output Outcome: Progressing Goal: Will show no evidence of cardiac arrhythmias Outcome:  Progressing   Problem: Nutritional: Goal: Will attain and maintain optimal nutritional status Outcome: Progressing   Problem: Neurological: Goal: Will regain or maintain usual level of consciousness Outcome: Progressing   Problem: Clinical Measurements: Goal: Ability to maintain clinical measurements within normal limits Outcome: Progressing Goal: Postoperative complications will be avoided or minimized Outcome: Progressing   Problem: Respiratory: Goal: Will regain and/or maintain adequate ventilation Outcome: Progressing Goal: Respiratory status will improve Outcome: Progressing   Problem: Skin Integrity: Goal: Demonstrates signs of wound healing without infection Outcome: Progressing   Problem: Urinary Elimination: Goal: Will remain free from infection Outcome: Progressing Goal: Ability to achieve and maintain adequate urine output Outcome: Progressing   Problem: Education: Goal: Ability to describe self-care measures that may prevent or decrease complications (Diabetes Survival Skills Education) will improve Outcome: Progressing Goal: Individualized Educational Video(s) Outcome: Progressing   Problem: Coping: Goal: Ability to adjust to condition or change in health will improve Outcome: Progressing   Problem: Fluid Volume: Goal: Ability to maintain a balanced intake and output will improve Outcome: Progressing   Problem: Health Behavior/Discharge Planning: Goal: Ability to identify and utilize available resources and services will improve Outcome: Progressing Goal: Ability to manage health-related needs will improve Outcome: Progressing   Problem: Metabolic: Goal: Ability to maintain appropriate glucose levels will improve Outcome: Progressing   Problem: Nutritional: Goal: Maintenance of adequate nutrition will improve Outcome: Progressing Goal: Progress toward achieving an optimal weight will improve Outcome: Progressing   Problem: Skin  Integrity: Goal: Risk for impaired skin integrity will decrease Outcome: Progressing   Problem: Tissue Perfusion: Goal: Adequacy of tissue perfusion will improve Outcome: Progressing   Problem: Education: Goal: Ability to verbalize activity precautions or restrictions will improve Outcome: Progressing Goal: Knowledge of the prescribed therapeutic regimen will improve Outcome: Progressing Goal: Understanding of discharge needs will improve Outcome: Progressing   Problem: Activity: Goal: Ability to avoid complications of mobility impairment will improve Outcome: Progressing Goal: Ability to tolerate increased activity will improve Outcome: Progressing Goal: Will remain free from falls  Outcome: Progressing   Problem: Bowel/Gastric: Goal: Gastrointestinal status for postoperative course will improve Outcome: Progressing   "

## 2024-10-30 NOTE — Progress Notes (Signed)
 PT Cancellation Note  Patient Details Name: Steve Hunter MRN: [id number] DOB: 1946-05-24   Cancelled Treatment:    Reason Eval/Treat Not Completed: Other (comment)  Entered room to find patient on the floor. States he was trying to get out of bed and fell forward. RN promptly arrived to assume care. Pt assisted back onto bed for RN to assess vitals. Will hold formal PT evaluation until pt checked over by team.  Leontine Roads, PT, DPT [facility]  Rehabilitation Services Physical Therapist Office: [phone] Website: [email or web address]  Leontine GORMAN Roads 10/30/2024, 9:05 AM

## 2024-10-30 NOTE — Progress Notes (Signed)
" °   10/30/24 0904  Vitals  Temp 97.8 F (36.6 C)  BP (!) 152/90  MAP (mmHg) 107  BP Location Right Arm  BP Method Automatic  Patient Position (if appropriate) Sitting  Pulse Rate (!) 103  Pulse Rate Source Dinamap    "

## 2024-10-30 NOTE — Progress Notes (Signed)
" °   10/30/24 0904  What Happened  Was fall witnessed? No  Was patient injured? No  Patient found on floor  Found by No one-pt stated they fell  Stated prior activity other (comment) (leaned forward on edge of bed, lost balance and fell to floor, no injury)    "

## 2024-10-30 NOTE — Progress Notes (Signed)
" °   10/30/24 9095  Provider Notification  Notification Reason Fall  Provider response Other (Comment) (office called, waiting on response)  Date of Provider Response 10/30/24  Follow Up  Family notified Yes - comment  Time family notified 0915  Additional tests No  Simple treatment Other (comment) (no injury)  Progress note created (see row info) Yes  Adult Fall Risk Assessment  Risk Factor Category (scoring not indicated) History of more than one fall within 6 months before admission (document High fall risk)  Patient Fall Risk Level High fall risk  Adult Fall Risk Interventions  Required Bundle Interventions *See Row Information* High fall risk - low, moderate, and high requirements implemented  Additional Interventions Use of appropriate toileting equipment (bedpan, BSC, etc.)  Fall intervention(s) refused/Patient educated regarding refusal Bed alarm;Nonskid socks;Open door if unsupervised;Supervision while toileting/edge of bed sitting;Yellow bracelet  Screening for Fall Injury Risk (To be completed on HIGH fall risk patients) - Assessing Need for Floor Mats  Risk For Fall Injury- Criteria for Floor Mats None identified - No additional interventions needed  Vitals  Temp 97.8 F (36.6 C)  BP (!) 152/90  MAP (mmHg) 107  BP Location Right Arm  BP Method Automatic  Patient Position (if appropriate) Sitting  Pulse Rate (!) 103  Pulse Rate Source Dinamap  Oxygen Therapy  SpO2 99 %  O2 Device Room Air  Pain Assessment  Pain Scale 0-10  Pain Score 8  Pain Type Surgical pain  Pain Location Back  Pain Orientation Posterior;Lower  Pain Descriptors / Indicators Sharp;Shooting;Stabbing  Pain Onset On-going  Patients Stated Pain Goal 3  Pain Intervention(s) Medication (See eMAR)  Neurological  Neuro (WDL) WDL  Level of Consciousness Alert  Orientation Level Oriented X4  Neuro Additional Assessments Glasgow Coma Scale  Glasgow Coma Scale  Eye Opening 4  Best Verbal Response  (NON-intubated) 5  Best Motor Response 6  Glasgow Coma Scale Score 15  Musculoskeletal  Musculoskeletal (WDL) X  Assistive Device Front wheel walker  Generalized Weakness Yes  Weight Bearing Restrictions Per Provider Order No  Musculoskeletal Details  Lower Back Surgery  Integumentary  Integumentary (WDL) X  Skin Color Appropriate for ethnicity  Skin Condition Dry  Skin Integrity Other (Comment)  Skin Turgor Non-tenting    "

## 2024-10-31 LAB — GLUCOSE, CAPILLARY
Glucose-Capillary: 130 mg/dL — ABNORMAL HIGH (ref 70–99)
Glucose-Capillary: 145 mg/dL — ABNORMAL HIGH (ref 70–99)
Glucose-Capillary: 164 mg/dL — ABNORMAL HIGH (ref 70–99)

## 2024-10-31 MED ORDER — PANTOPRAZOLE SODIUM 40 MG PO TBEC
40.0000 mg | DELAYED_RELEASE_TABLET | Freq: Every day | ORAL | Status: DC
  Administered 2024-10-31 – 2024-11-05 (×6): 40 mg via ORAL
  Filled 2024-10-31 (×6): qty 1

## 2024-10-31 MED ORDER — CALCIUM CARBONATE ANTACID 500 MG PO CHEW
1.0000 | CHEWABLE_TABLET | Freq: Two times a day (BID) | ORAL | Status: AC | PRN
  Administered 2024-10-31: 200 mg via ORAL
  Filled 2024-10-31: qty 1

## 2024-10-31 NOTE — Progress Notes (Signed)
 Subjective: 2 Days Post-Op Procedures (LRB): LUMBAR TWO TO LUMBAR THREE AND LUMBAR THREE TO LUMBAR FOUR DECOMPRESSION AND DISCECTOMY WITH IN SITU FUSION (N/A) Patient reports pain as moderate.    Objective: Vital signs in last 24 hours: Temp:  [97.9 F (36.6 C)-98.3 F (36.8 C)] 97.9 F (36.6 C) (01/10 0600) Pulse Rate:  [93-117] 104 (01/10 0817) Resp:  [18-20] 18 (01/10 0817) BP: (114-156)/(67-98) 135/75 (01/10 0817) SpO2:  [91 %-98 %] 91 % (01/10 0817)  Intake/Output from previous day: 01/09 0701 - 01/10 0700 In: 240 [P.O.:240] Out: 1450 [Urine:1450] Intake/Output this shift: Total I/O In: 340 [P.O.:340] Out: 425 [Urine:425]  No results for input(s): HGB in the last 72 hours. No results for input(s): WBC, RBC, HCT, PLT in the last 72 hours. No results for input(s): NA, K, CL, CO2, BUN, CREATININE, GLUCOSE, CALCIUM  in the last 72 hours. No results for input(s): LABPT, INR in the last 72 hours.  Neurologically intact ABD soft Neurovascular intact Sensation intact distally Intact pulses distally Dorsiflexion/Plantar flexion intact Incision: dressing C/D/I and no drainage No cellulitis present Compartment soft No sign of DVT   Assessment/Plan: 2 Days Post-Op Procedures (LRB): LUMBAR TWO TO LUMBAR THREE AND LUMBAR THREE TO LUMBAR FOUR DECOMPRESSION AND DISCECTOMY WITH IN SITU FUSION (N/A) Advance diet Up with therapy D/C IV fluids D C to home Discussed brace and precautions   Stephan Nelis M Audriella Blakeley 10/31/2024, 10:33 AM

## 2024-10-31 NOTE — Progress Notes (Signed)
 Physical Therapy Treatment Patient Details Name: Steve Hunter MRN: [id number] DOB: June 16, 1946 Today's Date: 10/31/2024   History of Present Illness 79 y.o. male admitted 1/8 for elective lumbar surgery. S/p L2-3, L3-4 lumbar decompression and discectomy fusion on 1/8. PMH:  former smoker (quit 07/02/80), HTN, HLD, CKD 3, GERD on PPI, non-insulin -dependent DM2, OSA (s/p uvulectomy; does not use CPAP), bladder cancer (s/p TURBT 2001; s/p Urolift and bladder biopsy/fulguration 07/18/22), anxiety, chronic back pain, spinal surgery (L4-S1 PLIF 04/10/16; C3-4 ACDF 03/21/23).    PT Comments  Pt required min assist bed mobility, min assist transfers, and CGA amb 165' with RW. Reviewed back precautions and brace wear schedule. Educated on ascend/descend 3 steps with bilat rails. Pt reports wife and daughter able to assist upon d/c. Pt in recliner at end of session.     If plan is discharge home, recommend the following: A little help with walking and/or transfers;A little help with bathing/dressing/bathroom;Assistance with cooking/housework;Assist for transportation;Help with stairs or ramp for entrance   Can travel by private vehicle        Equipment Recommendations  None recommended by PT    Recommendations for Other Services       Precautions / Restrictions Precautions Precautions: Back;Fall Recall of Precautions/Restrictions: Impaired Precaution/Restrictions Comments: reviewed 3/3 back precautions and brace wear schedule Required Braces or Orthoses: Spinal Brace Spinal Brace: Lumbar corset;Applied in sitting position     Mobility  Bed Mobility Overal bed mobility: Needs Assistance Bed Mobility: Rolling, Sidelying to Sit Rolling: Contact guard assist, Used rails Sidelying to sit: Min assist       General bed mobility comments: cues for sequencing, increased time    Transfers Overall transfer level: Needs assistance Equipment used: Rolling walker (2 wheels) Transfers: Sit  to/from Stand Sit to Stand: Min assist           General transfer comment: Pt pulling up on RW. Increased time.    Ambulation/Gait Ambulation/Gait assistance: Contact guard assist Gait Distance (Feet): 165 Feet Assistive device: Rolling walker (2 wheels) Gait Pattern/deviations: Step-through pattern, Decreased stride length, Trunk flexed, Narrow base of support, Knee flexed in stance - right, Knee flexed in stance - left Gait velocity: decreased Gait velocity interpretation: <1.31 ft/sec, indicative of household ambulator   General Gait Details: continual cues to stay close to [employer]:  (simulated steps in room. Pt reports no concerns managing 3 STE with bilat rails.)           Wheelchair Mobility     Tilt Bed    Modified Rankin (Stroke Patients Only)       Balance Overall balance assessment: Needs assistance Sitting-balance support: No upper extremity supported, Feet supported Sitting balance-Leahy Scale: Fair     Standing balance support: Bilateral upper extremity supported, During functional activity, Reliant on assistive device for balance Standing balance-Leahy Scale: Poor                              Communication Communication Communication: No apparent difficulties  Cognition Arousal: Alert Behavior During Therapy: WFL for tasks assessed/performed   PT - Cognitive impairments: Memory, Safety/Judgement                         Following commands: Intact      Cueing Cueing Techniques: Verbal cues, Gestural cues, Tactile cues  Exercises      General Comments  Pertinent Vitals/Pain Pain Assessment Pain Assessment: Faces Faces Pain Scale: Hurts a little bit Pain Location: R thigh Pain Descriptors / Indicators: Sore Pain Intervention(s): Monitored during session, Repositioned    Home Living                          Prior Function            PT Goals (current goals can now be found in  the care plan section) Acute Rehab PT Goals Patient Stated Goal: home Progress towards PT goals: Progressing toward goals    Frequency    Min 5X/week      PT Plan      Co-evaluation              AM-PAC PT 6 Clicks Mobility   Outcome Measure  Help needed turning from your back to your side while in a flat bed without using bedrails?: A Little Help needed moving from lying on your back to sitting on the side of a flat bed without using bedrails?: A Little Help needed moving to and from a bed to a chair (including a wheelchair)?: A Little Help needed standing up from a chair using your arms (e.g., wheelchair or bedside chair)?: A Little Help needed to walk in hospital room?: A Little Help needed climbing 3-5 steps with a railing? : A Little 6 Click Score: 18    End of Session Equipment Utilized During Treatment: Gait belt;Back brace Activity Tolerance: Patient tolerated treatment well Patient left: in chair;with call bell/phone within reach;with chair alarm set Nurse Communication: Mobility status PT Visit Diagnosis: Unsteadiness on feet (R26.81);Other abnormalities of gait and mobility (R26.89);Muscle weakness (generalized) (M62.81);Repeated falls (R29.6);History of falling (Z91.81);Difficulty in walking, not elsewhere classified (R26.2);Pain Pain - Right/Left: Right Pain - part of body: Leg     Time: 9192-9168 PT Time Calculation (min) (ACUTE ONLY): 24 min  Charges:    $Gait Training: 8-22 mins $Therapeutic Activity: 8-22 mins PT General Charges $$ ACUTE PT VISIT: 1 Visit                     Sari MATSU., PT  Office # [phone]    Steve Hunter 10/31/2024, 9:01 AM

## 2024-10-31 NOTE — Progress Notes (Signed)
 Occupational Therapy Treatment Patient Details Name: Steve Hunter MRN: [id number] DOB: 01-Aug-1946 Today's Date: 10/31/2024   History of present illness 79 y.o. male admitted 1/8 for elective lumbar surgery. S/p L2-3, L3-4 lumbar decompression and discectomy fusion on 1/8. PMH:  former smoker (quit 07/02/80), HTN, HLD, CKD 3, GERD on PPI, non-insulin -dependent DM2, OSA (s/p uvulectomy; does not use CPAP), bladder cancer (s/p TURBT 2001; s/p Urolift and bladder biopsy/fulguration 07/18/22), anxiety, chronic back pain, spinal surgery (L4-S1 PLIF 04/10/16; C3-4 ACDF 03/21/23).   OT comments  Pt. Seen for skilled OT treatment session with dtr and son-in-law present.  UB dressing of brace and shirt with MAX A for maintaining back precautions and balance support. Pt. Cont. Lean/fall to R in un supported sitting and was not initiating a stop.  Therapist asst. Had to provide trunk support to prevent cont. Lob to the R.  Unable to don/doff brace despite step by step instructions.  Sit/stand post. Lob x3 with therapist asst. Intervention required to stop the LOB while pt. Attempting to pull up underwear.  Dtr. Reports she is not available for 24/7 as she works and lives 30 min. Away.  She states the pts. Wife can not provide 24/7 physical assistance.  She states her father is not cognitively at baseline and is wondering if it is related to pain meds.  Pt. Presents as a high fall risk and dtr. Reports pt. will cont. To get up and walk without assistance despite telling him not to.  Pt. Will benefit from <3hrs a day cont. Therapies prior to home for fall prevention, strengthening, and reducing risk of re-admission.  Will alert OTR/L of need to update d/c recommendations.  RN has been notified of family concerns also.         If plan is discharge home, recommend the following:  A little help with walking and/or transfers;A lot of help with bathing/dressing/bathroom;Assistance with cooking/housework;Direct  supervision/assist for medications management;Direct supervision/assist for financial management;Assist for transportation;Help with stairs or ramp for entrance   Equipment Recommendations  BSC/3in1    Recommendations for Other Services      Precautions / Restrictions Precautions Precautions: Back;Fall Recall of Precautions/Restrictions: Impaired Precaution/Restrictions Comments: reviewed 3/3 back precautions and brace wear schedule Required Braces or Orthoses: Spinal Brace Spinal Brace: Lumbar corset;Applied in sitting position Restrictions Other Position/Activity Restrictions: back       Mobility Bed Mobility               General bed mobility comments: in recliner at beg. and end of session    Transfers Overall transfer level: Needs assistance Equipment used: Rolling walker (2 wheels) Transfers: Sit to/from Stand, Bed to chair/wheelchair/BSC Sit to Stand: Min assist     Step pivot transfers: Min assist     General transfer comment: Pt pulling up on RW. Increased time., multiple post. LOB with therapist asst. having to intervene for fall prevention. attempting to hold front of RW with right hand     Balance Overall balance assessment: Needs assistance Sitting-balance support: Single extremity supported, Feet supported Sitting balance-Leahy Scale: Poor Sitting balance - Comments: in recliner unable to sit un supported without falling to the R, not initiating use of UE to stop the fall, therapist asst. having to support trunk and provide balance support   Standing balance support: Bilateral upper extremity supported, During functional activity, Reliant on assistive device for balance Standing balance-Leahy Scale: Poor  ADL either performed or assessed with clinical judgement   ADL Overall ADL's : Needs assistance/impaired           Upper Body Bathing Details (indicate cue type and reason): don/doff brace and don  shirt. pt. unable to complete either task w/o physica assistance and cont. reminders of back precautions     Upper Body Dressing : Set up;Maximal assistance;Cueing for sequencing;Cueing for compensatory techniques Upper Body Dressing Details (indicate cue type and reason): don/doff brace and don shirt. pt. unable to complete either task w/o physica assistance and cont. reminders of back precautions Lower Body Dressing: Set up;Sitting/lateral leans;Sit to/from stand;Cueing for back precautions;Adhering to back precautions;Cueing for compensatory techniques;Cueing for safety;Maximal assistance Lower Body Dressing Details (indicate cue type and reason): multiple lob while pulling up underwear. max cues for not breaking precuations while donning the first portion in sitting, all lob occured in standing             Functional mobility during ADLs: Rolling walker (2 wheels);Cueing for safety;Minimal assistance General ADL Comments: Pt with decreased activity tolerance, fatiguing quickly during tasks, self distracting. max cues for task completion, max cues for maintaining back precautions    Extremity/Trunk Assessment              Vision       Perception     Praxis     Communication Communication Communication: No apparent difficulties   Cognition Arousal: Alert Behavior During Therapy: WFL for tasks assessed/performed Cognition: Cognition impaired     Awareness: Intellectual awareness intact, Online awareness impaired Memory impairment (select all impairments): Short-term memory, Working memory Attention impairment (select first level of impairment): Alternating attention Executive functioning impairment (select all impairments): Organization, Problem solving, Reasoning OT - Cognition Comments: pts. dtr. reports pt. not himself questions if lingering affects from anesthesia and pain medication                 Following commands: Impaired, Intact Following commands  impaired: Follows one step commands with increased time, Follows one step commands inconsistently      Cueing   Cueing Techniques: Verbal cues, Gestural cues, Tactile cues  Exercises      Shoulder Instructions       General Comments      Pertinent Vitals/ Pain       Pain Assessment Pain Assessment: No/denies pain  Home Living                                          Prior Functioning/Environment              Frequency  Min 2X/week        Progress Toward Goals  OT Goals(current goals can now be found in the care plan section)  Progress towards OT goals: Progressing toward goals     Plan      Co-evaluation                 AM-PAC OT 6 Clicks Daily Activity     Outcome Measure   Help from another person eating meals?: A Little Help from another person taking care of personal grooming?: A Little Help from another person toileting, which includes using toliet, bedpan, or urinal?: A Lot Help from another person bathing (including washing, rinsing, drying)?: A Lot Help from another person to put on and taking off regular upper body clothing?: A Little  Help from another person to put on and taking off regular lower body clothing?: A Lot 6 Click Score: 15    End of Session Equipment Utilized During Treatment: Rolling walker (2 wheels);Back brace  OT Visit Diagnosis: Other abnormalities of gait and mobility (R26.89);Unsteadiness on feet (R26.81);History of falling (Z91.81);Other symptoms and signs involving cognitive function   Activity Tolerance Other (comment);Patient limited by fatigue (multiple partial lob in standing and sitting)   Patient Left in chair;with chair alarm set;with family/visitor present   Nurse Communication Mobility status;Precautions;Other (comment) (alerted rn family concerns regarding pt not being safe for home and does not have family support avail for 24/7)        Time: 1228-1300 OT Time Calculation  (min): 32 min  Charges: OT General Charges $OT Visit: 1 Visit OT Treatments $Self Care/Home Management : 23-37 mins  Randall, COTA/L Acute Rehabilitation [phone]   CHRISTELLA Nest Lorraine-COTA/L  10/31/2024, 1:29 PM

## 2024-11-01 LAB — GLUCOSE, CAPILLARY
Glucose-Capillary: 142 mg/dL — ABNORMAL HIGH (ref 70–99)
Glucose-Capillary: 152 mg/dL — ABNORMAL HIGH (ref 70–99)
Glucose-Capillary: 136 mg/dL — ABNORMAL HIGH (ref 70–99)
Glucose-Capillary: 159 mg/dL — ABNORMAL HIGH (ref 70–99)

## 2024-11-01 MED ORDER — ACETAMINOPHEN 325 MG PO TABS
650.0000 mg | ORAL_TABLET | Freq: Four times a day (QID) | ORAL | Status: DC
  Administered 2024-11-01 – 2024-11-05 (×17): 650 mg via ORAL
  Filled 2024-11-01 (×16): qty 2

## 2024-11-01 MED ORDER — OXYCODONE HCL 5 MG PO TABS: 2.5000 mg | ORAL_TABLET | ORAL | Status: DC | PRN

## 2024-11-01 MED ORDER — TRAMADOL HCL 50 MG PO TABS
50.0000 mg | ORAL_TABLET | Freq: Four times a day (QID) | ORAL | Status: DC | PRN
  Administered 2024-11-01: 50 mg via ORAL
  Filled 2024-11-01: qty 1

## 2024-11-01 NOTE — Plan of Care (Signed)
 " Problem: Education: Goal: Knowledge of General Education information will improve Description: Including pain rating scale, medication(s)/side effects and non-pharmacologic comfort measures Outcome: Progressing   Problem: Health Behavior/Discharge Planning: Goal: Ability to manage health-related needs will improve Outcome: Progressing   Problem: Clinical Measurements: Goal: Ability to maintain clinical measurements within normal limits will improve Outcome: Progressing Goal: Will remain free from infection Outcome: Progressing Goal: Diagnostic test results will improve Outcome: Progressing Goal: Respiratory complications will improve Outcome: Progressing Goal: Cardiovascular complication will be avoided Outcome: Progressing   Problem: Activity: Goal: Risk for activity intolerance will decrease Outcome: Progressing   Problem: Nutrition: Goal: Adequate nutrition will be maintained Outcome: Progressing   Problem: Coping: Goal: Level of anxiety will decrease Outcome: Progressing   Problem: Elimination: Goal: Will not experience complications related to bowel motility Outcome: Progressing Goal: Will not experience complications related to urinary retention Outcome: Progressing   Problem: Pain Managment: Goal: General experience of comfort will improve and/or be controlled Outcome: Progressing   Problem: Safety: Goal: Ability to remain free from injury will improve Outcome: Progressing   Problem: Skin Integrity: Goal: Risk for impaired skin integrity will decrease Outcome: Progressing   Problem: Education: Goal: Knowledge of the prescribed therapeutic regimen will improve Outcome: Progressing   Problem: Bowel/Gastric: Goal: Gastrointestinal status for postoperative course will improve Outcome: Progressing   Problem: Cardiac: Goal: Ability to maintain an adequate cardiac output Outcome: Progressing Goal: Will show no evidence of cardiac arrhythmias Outcome:  Progressing   Problem: Nutritional: Goal: Will attain and maintain optimal nutritional status Outcome: Progressing   Problem: Neurological: Goal: Will regain or maintain usual level of consciousness Outcome: Progressing   Problem: Clinical Measurements: Goal: Ability to maintain clinical measurements within normal limits Outcome: Progressing Goal: Postoperative complications will be avoided or minimized Outcome: Progressing   Problem: Respiratory: Goal: Will regain and/or maintain adequate ventilation Outcome: Progressing Goal: Respiratory status will improve Outcome: Progressing   Problem: Skin Integrity: Goal: Demonstrates signs of wound healing without infection Outcome: Progressing   Problem: Urinary Elimination: Goal: Will remain free from infection Outcome: Progressing Goal: Ability to achieve and maintain adequate urine output Outcome: Progressing   Problem: Education: Goal: Ability to describe self-care measures that may prevent or decrease complications (Diabetes Survival Skills Education) will improve Outcome: Progressing Goal: Individualized Educational Video(s) Outcome: Progressing   Problem: Coping: Goal: Ability to adjust to condition or change in health will improve Outcome: Progressing   Problem: Fluid Volume: Goal: Ability to maintain a balanced intake and output will improve Outcome: Progressing   Problem: Health Behavior/Discharge Planning: Goal: Ability to identify and utilize available resources and services will improve Outcome: Progressing Goal: Ability to manage health-related needs will improve Outcome: Progressing   Problem: Metabolic: Goal: Ability to maintain appropriate glucose levels will improve Outcome: Progressing   Problem: Nutritional: Goal: Maintenance of adequate nutrition will improve Outcome: Progressing Goal: Progress toward achieving an optimal weight will improve Outcome: Progressing   Problem: Skin  Integrity: Goal: Risk for impaired skin integrity will decrease Outcome: Progressing   Problem: Tissue Perfusion: Goal: Adequacy of tissue perfusion will improve Outcome: Progressing   Problem: Education: Goal: Ability to verbalize activity precautions or restrictions will improve Outcome: Progressing Goal: Knowledge of the prescribed therapeutic regimen will improve Outcome: Progressing Goal: Understanding of discharge needs will improve Outcome: Progressing   Problem: Activity: Goal: Ability to avoid complications of mobility impairment will improve Outcome: Progressing Goal: Ability to tolerate increased activity will improve Outcome: Progressing Goal: Will remain free from falls  Outcome: Progressing   Problem: Bowel/Gastric: Goal: Gastrointestinal status for postoperative course will improve Outcome: Progressing   "

## 2024-11-01 NOTE — Progress Notes (Signed)
 Physical Therapy Treatment Patient Details Name: Steve Hunter MRN: [id number] DOB: 1946-09-08 Today's Date: 11/01/2024   History of Present Illness 79 y.o. male admitted 1/8 for elective lumbar surgery. S/p L2-3, L3-4 lumbar decompression and discectomy fusion on 1/8. PMH:  former smoker (quit 07/02/80), HTN, HLD, CKD 3, GERD on PPI, non-insulin -dependent DM2, OSA (s/p uvulectomy; does not use CPAP), bladder cancer (s/p TURBT 2001; s/p Urolift and bladder biopsy/fulguration 07/18/22), anxiety, chronic back pain, spinal surgery (L4-S1 PLIF 04/10/16; C3-4 ACDF 03/21/23).    PT Comments  Pt's daughter present during session. She relays concerns with pt's mentation, and feels pain meds are causing confusion. Pt more lethargic today and slow to respond. Falls asleep without continual stimuli. He required min assist bed mobility, CGA transfers, and CGA amb 200' with RW. Daughter reports pt is always on the go and will be up continually if he discharges home (despite recs for slow return to activity). He does sleep in a recliner and will not need to manage bed mobility. Per daughter, MD to adjust pain meds today. PT to see early tomorrow for further determination of d/c plan.     If plan is discharge home, recommend the following: A little help with walking and/or transfers;A little help with bathing/dressing/bathroom;Assistance with cooking/housework;Assist for transportation;Help with stairs or ramp for entrance   Can travel by private vehicle        Equipment Recommendations  None recommended by PT    Recommendations for Other Services       Precautions / Restrictions Precautions Precautions: Back;Fall Recall of Precautions/Restrictions: Impaired Precaution/Restrictions Comments: reviewed 3/3 back precautions and brace wear schedule Required Braces or Orthoses: Spinal Brace Spinal Brace: Lumbar corset;Applied in sitting position     Mobility  Bed Mobility Overal bed mobility: Needs  Assistance Bed Mobility: Rolling, Sidelying to Sit Rolling: Contact guard assist, Used rails Sidelying to sit: Min assist, Used rails, HOB elevated     Sit to sidelying: Min assist, Used rails, HOB elevated General bed mobility comments: increased time, cues for sequencing. Pt sleeps in recliner at baseline.    Transfers Overall transfer level: Needs assistance Equipment used: Rolling walker (2 wheels) Transfers: Sit to/from Stand Sit to Stand: Contact guard assist, From elevated surface           General transfer comment: Pt pulling up on RW. Increased time to power up.    Ambulation/Gait Ambulation/Gait assistance: Contact guard assist Gait Distance (Feet): 200 Feet Assistive device: Rolling walker (2 wheels) Gait Pattern/deviations: Step-through pattern, Decreased stride length, Trunk flexed, Narrow base of support, Knee flexed in stance - right, Knee flexed in stance - left Gait velocity: decreased Gait velocity interpretation: <1.31 ft/sec, indicative of household ambulator   General Gait Details: cues to stay close to [employer] Mobility     Tilt Bed    Modified Rankin (Stroke Patients Only)       Balance Overall balance assessment: Needs assistance Sitting-balance support: Feet supported, No upper extremity supported Sitting balance-Leahy Scale: Fair     Standing balance support: Bilateral upper extremity supported, During functional activity, Reliant on assistive device for balance Standing balance-Leahy Scale: Poor                              Communication Communication Communication: No apparent difficulties  Cognition Arousal: Lethargic Behavior During Therapy: Flat affect  PT - Cognitive impairments: Memory, Safety/Judgement                         Following commands: Impaired Following commands impaired: Follows one step commands with increased time    Cueing Cueing Techniques:  Verbal cues, Gestural cues, Tactile cues  Exercises      General Comments General comments (skin integrity, edema, etc.): Daughter present and supportive.      Pertinent Vitals/Pain Pain Assessment Pain Assessment: 0-10 Pain Score: 7  Pain Location: back Pain Descriptors / Indicators: Sore Pain Intervention(s): Monitored during session    Home Living                          Prior Function            PT Goals (current goals can now be found in the care plan section) Acute Rehab PT Goals Patient Stated Goal: home Progress towards PT goals: Progressing toward goals    Frequency    Min 5X/week      PT Plan      Co-evaluation              AM-PAC PT 6 Clicks Mobility   Outcome Measure  Help needed turning from your back to your side while in a flat bed without using bedrails?: A Little Help needed moving from lying on your back to sitting on the side of a flat bed without using bedrails?: A Little Help needed moving to and from a bed to a chair (including a wheelchair)?: A Little Help needed standing up from a chair using your arms (e.g., wheelchair or bedside chair)?: A Little Help needed to walk in hospital room?: A Little Help needed climbing 3-5 steps with a railing? : A Lot 6 Click Score: 17    End of Session Equipment Utilized During Treatment: Gait belt;Back brace Activity Tolerance: Patient tolerated treatment well Patient left: in bed;with call bell/phone within reach;with bed alarm set;with family/visitor present Nurse Communication: Mobility status PT Visit Diagnosis: Unsteadiness on feet (R26.81);Other abnormalities of gait and mobility (R26.89);Muscle weakness (generalized) (M62.81);Repeated falls (R29.6);History of falling (Z91.81);Difficulty in walking, not elsewhere classified (R26.2);Pain     Time: 1045-1110 PT Time Calculation (min) (ACUTE ONLY): 25 min  Charges:    $Gait Training: 23-37 mins PT General Charges $$ ACUTE  PT VISIT: 1 Visit                     Sari MATSU., PT  Office # [phone]    Erven Sari Shaker 11/01/2024, 11:52 AM

## 2024-11-01 NOTE — Progress Notes (Signed)
" ° °  Subjective: 3 Days Post-Op Procedures (LRB): LUMBAR TWO TO LUMBAR THREE AND LUMBAR THREE TO LUMBAR FOUR DECOMPRESSION AND DISCECTOMY WITH IN SITU FUSION (N/A) Patient reports pain as mild.   Seen for Dr. Burnetta. Patient is resting in bed on exam this morning. His family at the bedside. They report he slid out of bed this morning. Daughter is concerned that his pain medications have been too high and he seems a bit confused.  We will continued therapy today.   Objective: Vital signs in last 24 hours: Temp:  [97.6 F (36.4 C)-99 F (37.2 C)] 98.3 F (36.8 C) (01/11 0800) Pulse Rate:  [89-109] 109 (01/11 0800) Resp:  [18-20] 18 (01/11 0800) BP: (128-151)/(66-76) 129/76 (01/11 0800) SpO2:  [94 %-97 %] 97 % (01/11 0800)  Intake/Output from previous day:  Intake/Output Summary (Last 24 hours) at 11/01/2024 1028 Last data filed at 11/01/2024 0507 Gross per 24 hour  Intake --  Output 800 ml  Net -800 ml     Intake/Output this shift: No intake/output data recorded.  Labs: No results for input(s): HGB in the last 72 hours. No results for input(s): WBC, RBC, HCT, PLT in the last 72 hours. No results for input(s): NA, K, CL, CO2, BUN, CREATININE, GLUCOSE, CALCIUM  in the last 72 hours. No results for input(s): LABPT, INR in the last 72 hours.  Exam: General - Patient is Alert and Oriented Extremity - Neurologically intact Sensation intact distally Intact pulses distally Dorsiflexion/Plantar flexion intact Dressing - dressing C/D/I Motor Function - intact, moving feet and toes well on exam.   Past Medical History:  Diagnosis Date   Anxiety disorder    Aortic stenosis    Mild   Cancer (HCC)    Bladder   Chronic back pain    Diabetes mellitus    Type II   GERD (gastroesophageal reflux disease)    History of hiatal hernia    Hypercholesterolemia    Hypertension    Melanoma (HCC)    Multiple thyroid  nodules    Osteoarthritis    Sleep apnea     pt states he no longer has this- he had surgery (tonsillectomy) and it resolved    Assessment/Plan: 3 Days Post-Op Procedures (LRB): LUMBAR TWO TO LUMBAR THREE AND LUMBAR THREE TO LUMBAR FOUR DECOMPRESSION AND DISCECTOMY WITH IN SITU FUSION (N/A) Principal Problem:   Lumbar spinal stenosis  Estimated body mass index is 23.09 kg/m as calculated from the following:   Height as of this encounter: 6' 1 (1.854 m).   Weight as of this encounter: 79.4 kg. Advance diet Up with therapy  DVT Prophylaxis - None  Up with PT today Family and PT have now suggested SNF, which we discussed is typically not a good post-op plan They will have to discuss with Dr. Burnetta team tomorrow  We adjusted pain meds down to tramadol /oxy 2.5 for now  I contacted urology who states that the suprapubic catheter exchange can be done by nursing on the floor tomorrow  Rosina Calin, PA-C Orthopedic Surgery [phone] 11/01/2024, 10:28 AM  "

## 2024-11-02 LAB — GLUCOSE, CAPILLARY
Glucose-Capillary: 119 mg/dL — ABNORMAL HIGH (ref 70–99)
Glucose-Capillary: 140 mg/dL — ABNORMAL HIGH (ref 70–99)
Glucose-Capillary: 137 mg/dL — ABNORMAL HIGH (ref 70–99)
Glucose-Capillary: 119 mg/dL — ABNORMAL HIGH (ref 70–99)

## 2024-11-02 MED ORDER — METFORMIN HCL ER 500 MG PO TB24
500.0000 mg | ORAL_TABLET | Freq: Two times a day (BID) | ORAL | Status: DC
  Administered 2024-11-02 – 2024-11-05 (×7): 500 mg via ORAL
  Filled 2024-11-02 (×7): qty 1

## 2024-11-02 MED ORDER — LORAZEPAM 1 MG PO TABS
1.0000 mg | ORAL_TABLET | Freq: Every evening | ORAL | Status: DC | PRN
  Administered 2024-11-03: 1 mg via ORAL
  Filled 2024-11-02: qty 1

## 2024-11-02 NOTE — Progress Notes (Signed)
 Occupational Therapy Treatment Patient Details Name: Steve Hunter MRN: [id number] DOB: 21-Jan-1946 Today's Date: 11/02/2024   History of present illness 79 y.o. male admitted 1/8 for elective lumbar surgery. S/p L2-3, L3-4 lumbar decompression and discectomy fusion on 1/8. PMH:  former smoker (quit 07/02/80), HTN, HLD, CKD 3, GERD on PPI, non-insulin -dependent DM2, OSA (s/p uvulectomy; does not use CPAP), bladder cancer (s/p TURBT 2001; s/p Urolift and bladder biopsy/fulguration 07/18/22), anxiety, chronic back pain, spinal surgery (L4-S1 PLIF 04/10/16; C3-4 ACDF 03/21/23).   OT comments  Patient more alert this visit and able to recall 2/3 back precautions. Patient was CGA for bed mobility, transfers, and walker use with cues for posture and back precautions. Patient was educated on AE use for LB dressing with patient able to manage socks with figure 4 but benefits from reacher use for donning pants.  Patient will benefit from continued inpatient follow up therapy, <3 hours/day due to patient would benefit from further OT to increase safety and independence with self care tasks.  Acute OT to continue to follow to address established goals to facilitate DC to next venue of care.        If plan is discharge home, recommend the following:  A little help with walking and/or transfers;A lot of help with bathing/dressing/bathroom;Assistance with cooking/housework;Direct supervision/assist for medications management;Direct supervision/assist for financial management;Assist for transportation;Help with stairs or ramp for entrance   Equipment Recommendations  BSC/3in1    Recommendations for Other Services      Precautions / Restrictions Precautions Precautions: Back;Fall Precaution Booklet Issued: Yes (comment) Recall of Precautions/Restrictions: Impaired Precaution/Restrictions Comments: reviewed precautions Required Braces or Orthoses: Spinal Brace Spinal Brace: Lumbar corset;Applied in sitting  position Restrictions Weight Bearing Restrictions Per Provider Order: No       Mobility Bed Mobility Overal bed mobility: Needs Assistance Bed Mobility: Rolling, Sidelying to Sit Rolling: Contact guard assist, Used rails Sidelying to sit: Contact guard assist, Used rails       General bed mobility comments: education on log rolling with CGA to maintain log roll    Transfers Overall transfer level: Needs assistance Equipment used: Rolling walker (2 wheels) Transfers: Sit to/from Stand Sit to Stand: Contact guard assist, From elevated surface           General transfer comment: cues for hand placement     Balance Overall balance assessment: Needs assistance Sitting-balance support: Feet supported, No upper extremity supported Sitting balance-Leahy Scale: Fair Sitting balance - Comments: EOB   Standing balance support: Single extremity supported, Bilateral upper extremity supported, During functional activity Standing balance-Leahy Scale: Poor Standing balance comment: stood at sink for self care with cues for back precautions                           ADL either performed or assessed with clinical judgement   ADL Overall ADL's : Needs assistance/impaired     Grooming: Wash/dry hands;Wash/dry face;Oral care;Brushing hair;Contact guard assist;Standing Grooming Details (indicate cue type and reason): cues for compensation techniques         Upper Body Dressing : Moderate assistance;Sitting Upper Body Dressing Details (indicate cue type and reason): mod assist to donn brace seated on EOB Lower Body Dressing: Maximal assistance;Cueing for compensatory techniques;With adaptive equipment;Sit to/from stand Lower Body Dressing Details (indicate cue type and reason): education on AE use for LB dressign with reacher and sock aide. patient able to donn and doff socks without AE using figure 4 Toilet  Transfer: Contact guard assist;Rolling walker (2 wheels) Toilet  Transfer Details (indicate cue type and reason): simulated           General ADL Comments: frequent cues to adhere to back precautions    Extremity/Trunk Assessment              Vision       Perception     Praxis     Communication Communication Communication: No apparent difficulties   Cognition Arousal: Alert Behavior During Therapy: WFL for tasks assessed/performed Cognition: Cognition impaired     Awareness: Intellectual awareness intact, Online awareness impaired Memory impairment (select all impairments): Short-term memory, Working Biochemist, Clinical functioning impairment (select all impairments): Problem solving OT - Cognition Comments: cognition improving, able to recall 2/3 back precautions                 Following commands: Intact        Cueing   Cueing Techniques: Verbal cues, Visual cues  Exercises      Shoulder Instructions       General Comments cues for back precautions during self care tasks    Pertinent Vitals/ Pain       Pain Assessment Pain Assessment: Faces Faces Pain Scale: Hurts a little bit Pain Location: back Pain Descriptors / Indicators: Sore, Grimacing Pain Intervention(s): Limited activity within patient's tolerance, Monitored during session, Repositioned  Home Living                                          Prior Functioning/Environment              Frequency  Min 2X/week        Progress Toward Goals  OT Goals(current goals can now be found in the care plan section)  Progress towards OT goals: Progressing toward goals  Acute Rehab OT Goals Patient Stated Goal: to get better then go home OT Goal Formulation: With patient/family Time For Goal Achievement: 11/13/24 Potential to Achieve Goals: Good ADL Goals Pt Will Perform Grooming: standing;with set-up (adhering to back precautions) Pt Will Perform Upper Body Bathing: with supervision;with contact guard assist;sitting (adhering  to back precautions) Pt Will Perform Lower Body Bathing: with contact guard assist;sit to/from stand (adhering to back precautions) Pt Will Perform Upper Body Dressing: with supervision;with set-up;sitting (adhering to back precautions) Pt Will Transfer to Toilet: with supervision;ambulating;bedside commode (with least restrictive AD; adhering to back precautions) Pt Will Perform Toileting - Clothing Manipulation and hygiene: with contact guard assist;sit to/from stand (adhering to back precautions)  Plan      Co-evaluation                 AM-PAC OT 6 Clicks Daily Activity     Outcome Measure   Help from another person eating meals?: A Little Help from another person taking care of personal grooming?: A Little Help from another person toileting, which includes using toliet, bedpan, or urinal?: A Little Help from another person bathing (including washing, rinsing, drying)?: A Lot Help from another person to put on and taking off regular upper body clothing?: A Little Help from another person to put on and taking off regular lower body clothing?: A Lot 6 Click Score: 16    End of Session Equipment Utilized During Treatment: Gait belt;Rolling walker (2 wheels);Back brace  OT Visit Diagnosis: Other abnormalities of gait and mobility (R26.89);Unsteadiness on  feet (R26.81);History of falling (Z91.81);Other symptoms and signs involving cognitive function   Activity Tolerance Patient tolerated treatment well   Patient Left in chair;with chair alarm set;with family/visitor present   Nurse Communication Mobility status;Precautions        Time: 9061-8987 OT Time Calculation (min): 34 min  Charges: OT General Charges $OT Visit: 1 Visit OT Treatments $Self Care/Home Management : 23-37 mins  Dick Laine, OTA Acute Rehabilitation Services  Office [phone]   Jeb LITTIE Laine 11/02/2024, 11:15 AM

## 2024-11-02 NOTE — Plan of Care (Signed)
 " Problem: Education: Goal: Knowledge of General Education information will improve Description: Including pain rating scale, medication(s)/side effects and non-pharmacologic comfort measures Outcome: Progressing   Problem: Health Behavior/Discharge Planning: Goal: Ability to manage health-related needs will improve Outcome: Progressing   Problem: Clinical Measurements: Goal: Ability to maintain clinical measurements within normal limits will improve Outcome: Progressing Goal: Will remain free from infection Outcome: Progressing Goal: Diagnostic test results will improve Outcome: Progressing Goal: Respiratory complications will improve Outcome: Progressing Goal: Cardiovascular complication will be avoided Outcome: Progressing   Problem: Activity: Goal: Risk for activity intolerance will decrease Outcome: Progressing   Problem: Nutrition: Goal: Adequate nutrition will be maintained Outcome: Progressing   Problem: Coping: Goal: Level of anxiety will decrease Outcome: Progressing   Problem: Elimination: Goal: Will not experience complications related to bowel motility Outcome: Progressing Goal: Will not experience complications related to urinary retention Outcome: Progressing   Problem: Pain Managment: Goal: General experience of comfort will improve and/or be controlled Outcome: Progressing   Problem: Safety: Goal: Ability to remain free from injury will improve Outcome: Progressing   Problem: Skin Integrity: Goal: Risk for impaired skin integrity will decrease Outcome: Progressing   Problem: Education: Goal: Knowledge of the prescribed therapeutic regimen will improve Outcome: Progressing   Problem: Bowel/Gastric: Goal: Gastrointestinal status for postoperative course will improve Outcome: Progressing   Problem: Cardiac: Goal: Ability to maintain an adequate cardiac output Outcome: Progressing Goal: Will show no evidence of cardiac arrhythmias Outcome:  Progressing   Problem: Nutritional: Goal: Will attain and maintain optimal nutritional status Outcome: Progressing   Problem: Neurological: Goal: Will regain or maintain usual level of consciousness Outcome: Progressing   Problem: Clinical Measurements: Goal: Ability to maintain clinical measurements within normal limits Outcome: Progressing Goal: Postoperative complications will be avoided or minimized Outcome: Progressing   Problem: Respiratory: Goal: Will regain and/or maintain adequate ventilation Outcome: Progressing Goal: Respiratory status will improve Outcome: Progressing   Problem: Skin Integrity: Goal: Demonstrates signs of wound healing without infection Outcome: Progressing   Problem: Urinary Elimination: Goal: Will remain free from infection Outcome: Progressing Goal: Ability to achieve and maintain adequate urine output Outcome: Progressing   Problem: Education: Goal: Ability to describe self-care measures that may prevent or decrease complications (Diabetes Survival Skills Education) will improve Outcome: Progressing Goal: Individualized Educational Video(s) Outcome: Progressing   Problem: Coping: Goal: Ability to adjust to condition or change in health will improve Outcome: Progressing   Problem: Fluid Volume: Goal: Ability to maintain a balanced intake and output will improve Outcome: Progressing   Problem: Health Behavior/Discharge Planning: Goal: Ability to identify and utilize available resources and services will improve Outcome: Progressing Goal: Ability to manage health-related needs will improve Outcome: Progressing   Problem: Metabolic: Goal: Ability to maintain appropriate glucose levels will improve Outcome: Progressing   Problem: Nutritional: Goal: Maintenance of adequate nutrition will improve Outcome: Progressing Goal: Progress toward achieving an optimal weight will improve Outcome: Progressing   Problem: Skin  Integrity: Goal: Risk for impaired skin integrity will decrease Outcome: Progressing   Problem: Tissue Perfusion: Goal: Adequacy of tissue perfusion will improve Outcome: Progressing   Problem: Education: Goal: Ability to verbalize activity precautions or restrictions will improve Outcome: Progressing Goal: Knowledge of the prescribed therapeutic regimen will improve Outcome: Progressing Goal: Understanding of discharge needs will improve Outcome: Progressing   Problem: Activity: Goal: Ability to avoid complications of mobility impairment will improve Outcome: Progressing Goal: Ability to tolerate increased activity will improve Outcome: Progressing Goal: Will remain free from falls  Outcome: Progressing   Problem: Bowel/Gastric: Goal: Gastrointestinal status for postoperative course will improve Outcome: Progressing   "

## 2024-11-02 NOTE — [place] FL2 (Signed)
 " [place]  MEDICAID FL2 LEVEL OF CARE FORM     IDENTIFICATION  Patient Name: Steve Hunter Birthdate: 03-16-1946 Sex: male Admission Date (Current Location): 10/29/2024  [facility] [place] Number:  Producer, Television/film/video and Address:  [facility], [address], [place], [place] [address]      Provider Number: [id number]  Attending Physician Name and Address:  Burnetta Aures, MD  Relative Name and Phone Number:  Luke Rung - daughter - [phone] Rick Rung, daughter - [phone])    Current Level of Care: Hospital Recommended Level of Care: Skilled Nursing Facility Prior Approval Number:    Date Approved/Denied:   PASRR Number:    Discharge Plan: SNF    Current Diagnoses: Patient Active Problem List   Diagnosis Date Noted   Lumbar spinal stenosis 10/29/2024   Myelopathy (HCC) 03/21/2023   Melanoma (HCC)    Cancer (HCC)    Encounter for Medicare annual wellness exam 02/20/2017   Status post lumbar spinal fusion 04/18/2016   Lumbar stenosis 03/26/2016   DM (diabetes mellitus) (HCC) 02/23/2016   Hypertension 02/23/2016   Hyperlipidemia 02/23/2016   Osteoarthrosis involving multiple sites 02/23/2016   Acid reflux 02/23/2016   BPH with urinary obstruction 02/23/2016   Chronic back pain 02/23/2016   Insomnia 02/23/2016   Overweight 02/23/2016   Bladder cancer (HCC) 08/20/2013   Atypical chest pain 07/10/2012   Aortic stenosis     Orientation RESPIRATION BLADDER Height & Weight     Self, Time, Situation, Place  Normal Indwelling catheter (Suprapubic catheter) Weight: 79.4 kg Height:  6' 1 (185.4 cm)  BEHAVIORAL SYMPTOMS/MOOD NEUROLOGICAL BOWEL NUTRITION STATUS      Continent Diet (See discharge summary)  AMBULATORY STATUS COMMUNICATION OF NEEDS Skin   Limited Assist Verbally Surgical wounds                       Personal Care Assistance Level of Assistance  Bathing, Feeding, Dressing Bathing Assistance: Limited  assistance Feeding assistance: Independent Dressing Assistance: Limited assistance     Functional Limitations Info  Sight, Hearing, Speech Sight Info: Adequate Hearing Info: Adequate Speech Info: Adequate    SPECIAL CARE FACTORS FREQUENCY  PT (By licensed PT), OT (By licensed OT)     PT Frequency: 5 x per week OT Frequency: 5 x per week            Contractures Contractures Info: Not present    Additional Factors Info  Code Status, Allergies Code Status Info: Full Code Allergies Info: Lexiscan , vibegron           Current Medications (11/02/2024):  This is the current hospital active medication list Current Facility-Administered Medications  Medication Dose Route Frequency Provider Last Rate Last Admin   acetaminophen  (TYLENOL ) tablet 650 mg  650 mg Oral Q6H Patti Rosina SAUNDERS, PA-C   650 mg at 11/02/24 0600   atorvastatin  (LIPITOR) tablet 40 mg  40 mg Oral Daily Burnetta Aures, MD   40 mg at 11/02/24 9144   insulin  aspart (novoLOG ) injection 0-15 Units  0-15 Units Subcutaneous TID WC Burnetta Aures, MD       insulin  aspart (novoLOG ) injection 0-5 Units  0-5 Units Subcutaneous QHS Burnetta Aures, MD       lactated ringers  infusion   Intravenous Continuous Burnetta Aures, MD       LORazepam  (ATIVAN ) tablet 1 mg  1 mg Oral QHS Burnetta Aures, MD   1 mg at 11/01/24 2223  losartan  (COZAAR ) tablet 25 mg  25 mg Oral Daily Burnetta Aures, MD   25 mg at 11/02/24 9144   magnesium  citrate solution 0.5 Bottle  0.5 Bottle Oral Once PRN Brooks, Dahari, MD       menthol  (CEPACOL) lozenge 3 mg  1 lozenge Oral PRN Burnetta Aures, MD       Or   phenol (CHLORASEPTIC) mouth spray 1 spray  1 spray Mouth/Throat PRN Burnetta Aures, MD       metFORMIN  (GLUCOPHAGE -XR) 24 hr tablet 500 mg  500 mg Oral BID WC Burnetta Aures, MD   500 mg at 11/02/24 1043   methocarbamol  (ROBAXIN ) tablet 500 mg  500 mg Oral Q6H PRN Burnetta Aures, MD   500 mg at 10/29/24 2114   Or   methocarbamol  (ROBAXIN )  injection 500 mg  500 mg Intravenous Q6H PRN Burnetta Aures, MD   500 mg at 10/30/24 9386   nortriptyline  (PAMELOR ) capsule 25 mg  25 mg Oral QHS Burnetta Aures, MD   25 mg at 11/01/24 2224   ondansetron  (ZOFRAN ) tablet 4 mg  4 mg Oral Q6H PRN Burnetta Aures, MD       Or   ondansetron  (ZOFRAN ) injection 4 mg  4 mg Intravenous Q6H PRN Burnetta Aures, MD   4 mg at 10/29/24 1858   oxyCODONE  (Oxy IR/ROXICODONE ) immediate release tablet 2.5 mg  2.5 mg Oral Q4H PRN Patti Rosina SAUNDERS, PA-C       pantoprazole  (PROTONIX ) EC tablet 40 mg  40 mg Oral Daily Burnetta Aures, MD   40 mg at 11/02/24 0855   polyethylene glycol (MIRALAX  / GLYCOLAX ) packet 17 g  17 g Oral Daily PRN Burnetta Aures, MD   17 g at 10/31/24 1032   sodium chloride  flush (NS) 0.9 % injection 3 mL  3 mL Intravenous Q12H Burnetta Aures, MD   3 mL at 11/02/24 0856   sodium chloride  flush (NS) 0.9 % injection 3 mL  3 mL Intravenous PRN Burnetta Aures, MD       traMADol  (ULTRAM ) tablet 50 mg  50 mg Oral Q6H PRN Patti Rosina SAUNDERS, PA-C   50 mg at 11/01/24 1130     Discharge Medications: Please see discharge summary for a list of discharge medications.  Relevant Imaging Results:  Relevant Lab Results:   Additional Information SS# [id number]  Rosaline SAUNDERS Joe, RN     "

## 2024-11-02 NOTE — Plan of Care (Signed)

## 2024-11-02 NOTE — Progress Notes (Signed)
 Transition of Care [facility]) - Inpatient Brief Assessment   Patient Details  Name: Steve Hunter MRN: [id number] Date of Birth: 1946-06-20  Transition of Care [facility]) CM/SW Contact:    Rosaline JONELLE Joe, RN Phone Number: 11/02/2024, 11:05 AM   Clinical Narrative: CM met with the patient and daughter at the bedside and the patient states that his wife is unable to assist in the home.  Patient is asking for SNF placement.  The patient's daughter is present at the bedside.  PT/OT are in agreeable for [facility] placement at this time and will change recommendations.  DME at the home includes suprapubic catheter, RW, shower seat, and WC.  SNF work up will be started and patient will be faxed out in the hub for bed offers.  Patient's daughter prefers [employer].   Transition of Care Asessment: Insurance and Status: (P) Insurance coverage has been reviewed Patient has primary care physician: (P) Yes Home environment has been reviewed: (P) from home with wife Prior level of function:: (P) RW Prior/Current Home Services: (P) No current home services Social Drivers of Health Review: (P) SDOH reviewed needs interventions Readmission risk has been reviewed: (P) Yes Transition of care needs: (P) transition of care needs identified, TOC will continue to follow

## 2024-11-02 NOTE — Care Management Important Message (Signed)
 Important Message  Patient Details  Name: Steve Hunter MRN: [id number] Date of Birth: May 30, 1946   Important Message Given:  Yes - Medicare IM     Claretta Deed 11/02/2024, 3:30 PM

## 2024-11-02 NOTE — Progress Notes (Signed)
 Physical Therapy Treatment Patient Details Name: Steve Hunter MRN: [id number] DOB: 1946/04/16 Today's Date: 11/02/2024   History of Present Illness 79 y.o. male admitted 1/8 for elective lumbar surgery. S/p L2-3, L3-4 lumbar decompression and discectomy fusion on 1/8. PMH:  former smoker (quit 07/02/80), HTN, HLD, CKD 3, GERD on PPI, non-insulin -dependent DM2, OSA (s/p uvulectomy; does not use CPAP), bladder cancer (s/p TURBT 2001; s/p Urolift and bladder biopsy/fulguration 07/18/22), anxiety, chronic back pain, spinal surgery (L4-S1 PLIF 04/10/16; C3-4 ACDF 03/21/23).    PT Comments  Pt received the recliner and agreeable to session. Pt reports increased fatigue after working with OT earlier, however is able to tolerate gait trial and BLE exercise. Pt able to recall back precautions with min cues, but requires reminders to maintain functionally. Pt requires increased assist to elevate BLE back to EOB. Pt continues to benefit from PT services to progress toward functional mobility goals.     If plan is discharge home, recommend the following: A little help with walking and/or transfers;A little help with bathing/dressing/bathroom;Assistance with cooking/housework;Assist for transportation;Help with stairs or ramp for entrance   Can travel by private vehicle        Equipment Recommendations  None recommended by PT    Recommendations for Other Services       Precautions / Restrictions Precautions Precautions: Back;Fall Recall of Precautions/Restrictions: Impaired Precaution/Restrictions Comments: reviewed precautions Required Braces or Orthoses: Spinal Brace Spinal Brace: Lumbar corset;Applied in sitting position Restrictions Weight Bearing Restrictions Per Provider Order: No     Mobility  Bed Mobility Overal bed mobility: Needs Assistance Bed Mobility: Rolling, Sit to Sidelying Rolling: Contact guard assist, Used rails       Sit to sidelying: Mod assist General bed mobility  comments: cues for sequencing and assist for BLE elevation to EOB. Pt verbalizing logroll technique, but requires cues to maintain precautions    Transfers Overall transfer level: Needs assistance Equipment used: Rolling walker (2 wheels) Transfers: Sit to/from Stand Sit to Stand: Min assist           General transfer comment: STS from recliner with cues for hand placement and light assist for rise    Ambulation/Gait Ambulation/Gait assistance: Contact guard assist Gait Distance (Feet): 60 Feet Assistive device: Rolling walker (2 wheels) Gait Pattern/deviations: Step-through pattern, Decreased stride length, Trunk flexed, Narrow base of support       General Gait Details: slow gait with low foot clearance. cues for RW proximity and upright posture.   Stairs             Wheelchair Mobility     Tilt Bed    Modified Rankin (Stroke Patients Only)       Balance Overall balance assessment: Needs assistance Sitting-balance support: Feet supported, No upper extremity supported Sitting balance-Leahy Scale: Fair Sitting balance - Comments: EOB with slight posterior lean during exercises, but no LOB   Standing balance support: Bilateral upper extremity supported, During functional activity, Reliant on assistive device for balance Standing balance-Leahy Scale: Poor Standing balance comment: reliant on RW                            Communication Communication Communication: No apparent difficulties  Cognition Arousal: Alert Behavior During Therapy: WFL for tasks assessed/performed   PT - Cognitive impairments: Memory, Safety/Judgement                         Following commands:  Intact Following commands impaired: Follows one step commands with increased time    Cueing Cueing Techniques: Verbal cues, Visual cues  Exercises General Exercises - Lower Extremity Long Arc Quad: Both, 10 reps, Seated, Strengthening, AROM    General Comments  General comments (skin integrity, edema, etc.): cues for back precautions during self care tasks      Pertinent Vitals/Pain Pain Assessment Pain Assessment: Faces Faces Pain Scale: Hurts little more Pain Location: back Pain Descriptors / Indicators: Sore, Grimacing, Guarding Pain Intervention(s): Limited activity within patient's tolerance, Monitored during session, Repositioned     PT Goals (current goals can now be found in the care plan section) Acute Rehab PT Goals Patient Stated Goal: home PT Goal Formulation: With patient/family Time For Goal Achievement: 11/13/24 Progress towards PT goals: Progressing toward goals    Frequency    Min 5X/week       AM-PAC PT 6 Clicks Mobility   Outcome Measure  Help needed turning from your back to your side while in a flat bed without using bedrails?: A Little Help needed moving from lying on your back to sitting on the side of a flat bed without using bedrails?: A Little Help needed moving to and from a bed to a chair (including a wheelchair)?: A Little Help needed standing up from a chair using your arms (e.g., wheelchair or bedside chair)?: A Little Help needed to walk in hospital room?: A Little Help needed climbing 3-5 steps with a railing? : A Lot 6 Click Score: 17    End of Session Equipment Utilized During Treatment: Gait belt;Back brace Activity Tolerance: Patient tolerated treatment well;Patient limited by fatigue Patient left: in bed;with call bell/phone within reach;with bed alarm set;with family/visitor present Nurse Communication: Mobility status PT Visit Diagnosis: Unsteadiness on feet (R26.81);Other abnormalities of gait and mobility (R26.89);Muscle weakness (generalized) (M62.81);Repeated falls (R29.6);History of falling (Z91.81);Difficulty in walking, not elsewhere classified (R26.2);Pain Pain - Right/Left: Right Pain - part of body: Leg     Time: 8942-8883 PT Time Calculation (min) (ACUTE ONLY): 19  min  Charges:    $Gait Training: 8-22 mins PT General Charges $$ ACUTE PT VISIT: 1 Visit                    Darryle George, PTA Acute Rehabilitation Services Secure Chat Preferred  [phone]    Darryle George 11/02/2024, 1:16 PM

## 2024-11-02 NOTE — Progress Notes (Signed)
" ° ° °  Subjective: Procedures (LRB): LUMBAR TWO TO LUMBAR THREE AND LUMBAR THREE TO LUMBAR FOUR DECOMPRESSION AND DISCECTOMY WITH IN SITU FUSION (N/A) 4 Days Post-Op  Patient reports pain as 3 on 0-10 scale.  Reports decreased leg pain Patient has suprapubic Foley in place.   Positive incisional back pain    Positive bowel movement Positive flatus Negative chest pain or shortness of breath  Objective: Vital signs in last 24 hours: Temp:  [97.4 F (36.3 C)-97.8 F (36.6 C)] 97.4 F (36.3 C) (01/12 0516) Pulse Rate:  [84-108] 84 (01/12 0728) Resp:  [18-20] 20 (01/12 0516) BP: (104-158)/(63-86) 119/63 (01/12 0728) SpO2:  [96 %-99 %] 97 % (01/12 0728)  Intake/Output from previous day: 01/11 0701 - 01/12 0700 In: 236 [P.O.:236] Out: 750 [Urine:750]  Labs: No results for input(s): WBC, RBC, HCT, PLT in the last 72 hours. No results for input(s): NA, K, CL, CO2, BUN, CREATININE, GLUCOSE, CALCIUM  in the last 72 hours. No results for input(s): LABPT, INR in the last 72 hours.  Physical Exam: Neurologically intact ABD soft Sensation intact distally Dorsiflexion/Plantar flexion intact Incision: dressing C/D/I and no drainage Compartment soft Body mass index is 23.09 kg/m.   Assessment/Plan: Patient stable Continue mobilization with physical therapy Continue care  Patient has had multiple falls while on the floor.  Essentially he wants to get out of bed and ambulate but does not wait for nursing assistance.  Patient then slides out of bed onto the floor.  Will transfer patient to [facility] where he can have more assistance to facilitate ambulation. Patient quite sensitive to narcotic medications.  Will DC oxycodone  and tramadol .  Will use Tylenol  for pain control.  Will also stop the muscle relaxers as this too can be contributing to some mild confusion resulting in him getting out of bed without assistance and falling. Will set up nursing home discharge.   Patient lives at home with his wife and she is not able to care for him at the level that he currently requires.  Therefore we will set up an SNF discharge. Patient will have his catheter changed today by the nursing staff.  We will contact his urologist concerning his planned follow-up tomorrow.  Perhaps they can see him in the hospital and either exchange or remove the suprapubic catheter tube.  Donaciano Sprang, MD [facility] [phone]  "

## 2024-11-03 LAB — GLUCOSE, CAPILLARY
Glucose-Capillary: 117 mg/dL — ABNORMAL HIGH (ref 70–99)
Glucose-Capillary: 120 mg/dL — ABNORMAL HIGH (ref 70–99)
Glucose-Capillary: 126 mg/dL — ABNORMAL HIGH (ref 70–99)
Glucose-Capillary: 126 mg/dL — ABNORMAL HIGH (ref 70–99)
Glucose-Capillary: 127 mg/dL — ABNORMAL HIGH (ref 70–99)

## 2024-11-03 NOTE — TOC Progression Note (Signed)
 Transition of Care [facility]) - Progression Note    Patient Details  Name: Steve Hunter MRN: [id number] Date of Birth: [birth year]  Transition of Care [facility]) CM/SW Contact  Sherline Clack, [place] Phone Number: 11/03/2024, 3:26 PM  Clinical Narrative:     Patient received call from patient's daughter asking about placement at [facility] due to the proximity to patient's home. CSW reached out to [facility] admissions to ask about bed availability. At this time, [facility] is able to offer a bed for patient. CSW will start insurance auth and update patient's discharge plan.   Expected Discharge Plan: Skilled Nursing Facility Barriers to Discharge: Insurance Authorization               Expected Discharge Plan and Services       Living arrangements for the past 2 months: Single Family Home Expected Discharge Date: 10/31/24                                     Social Drivers of Health (SDOH) Interventions SDOH Screenings   Food Insecurity: No Food Insecurity (10/29/2024)  Housing: Low Risk (10/29/2024)  Transportation Needs: No Transportation Needs (10/29/2024)  Utilities: Not At Risk (10/29/2024)  Financial Resource Strain: Low Risk (11/06/2022)   Received from [facility]  Social Connections: Unknown (10/29/2024)  Stress: No Stress Concern Present (03/27/2024)   Received from [facility]  Tobacco Use: Medium Risk (10/30/2024)    Readmission Risk Interventions    11/02/2024   11:05 AM  Readmission Risk Prevention Plan  Post Dischage Appt Complete  Medication Screening Complete  Transportation Screening Complete

## 2024-11-03 NOTE — Progress Notes (Signed)
" ° ° °  Subjective: Procedures (LRB): LUMBAR TWO TO LUMBAR THREE AND LUMBAR THREE TO LUMBAR FOUR DECOMPRESSION AND DISCECTOMY WITH IN SITU FUSION (N/A) 5 Days Post-Op  Patient reports pain as 2 on 0-10 scale.  Reports decreased leg pain reports incisional back pain   Positive void into suprapubic foley, changed yesterday without complications Positive bowel movement Positive flatus Negative chest pain or shortness of breath  Objective: Vital signs in last 24 hours: Temp:  [97.7 F (36.5 C)-98.2 F (36.8 C)] 97.7 F (36.5 C) (01/13 0353) Pulse Rate:  [91-100] 92 (01/13 0353) Resp:  [16-17] 17 (01/13 0353) BP: (115-152)/(68-82) 133/78 (01/13 0353) SpO2:  [96 %-98 %] 96 % (01/13 0353) FiO2 (%):  [17 %] 17 % (01/13 0353)  Intake/Output from previous day: 01/12 0701 - 01/13 0700 In: -  Out: 1050 [Urine:1050]  Labs: No results for input(s): WBC, RBC, HCT, PLT in the last 72 hours. No results for input(s): NA, K, CL, CO2, BUN, CREATININE, GLUCOSE, CALCIUM  in the last 72 hours. No results for input(s): LABPT, INR in the last 72 hours.  Physical Exam: Neurologically intact ABD soft Sensation intact distally Dorsiflexion/Plantar flexion intact Incision: dressing C/D/I No cellulitis present Compartment soft Body mass index is 23.09 kg/m.   Assessment/Plan: Patient stable  Continue mobilization with physical therapy Continue care  Patient A+O x3, do believe that d/c pain medication and muscle relaxer are improving mentation  Plan to d/c to SNF  Jeoffrey Sages PA-C for Dr. Donaciano Sprang, MD [facility] [phone]  "

## 2024-11-03 NOTE — Progress Notes (Signed)
 Physical Therapy Treatment Patient Details Name: Steve Hunter MRN: [id number] DOB: [birth year] Today's Date: 11/03/2024   History of Present Illness 79 y.o. male admitted 1/8 for elective lumbar surgery. S/p L2-3, L3-4 lumbar decompression and discectomy fusion on 1/8. PMH:  former smoker (quit 07/02/80), HTN, HLD, CKD 3, GERD on PPI, non-insulin -dependent DM2, OSA (s/p uvulectomy; does not use CPAP), bladder cancer (s/p TURBT 2001; s/p Urolift and bladder biopsy/fulguration 07/18/22), anxiety, chronic back pain, spinal surgery (L4-S1 PLIF 04/10/16; C3-4 ACDF 03/21/23).    PT Comments  Pt received in the recliner and agreeable to session. Pt reports improved pain this session and demonstrates improved activity tolerance. Pt continues to require cues for back precautions and min A for donning/doffing brace. Pt able to increase gait distance with frequent cues for posture. Pt able to tolerate additional BLE exercises post gait trial. Pt continues to benefit from PT services to progress toward functional mobility goals.     If plan is discharge home, recommend the following: A little help with walking and/or transfers;A little help with bathing/dressing/bathroom;Assistance with cooking/housework;Assist for transportation;Help with stairs or ramp for entrance   Can travel by private vehicle        Equipment Recommendations  None recommended by PT    Recommendations for Other Services       Precautions / Restrictions Precautions Precautions: Back;Fall Precaution Booklet Issued: Yes (comment) Recall of Precautions/Restrictions: Impaired Precaution/Restrictions Comments: reviewed precautions Required Braces or Orthoses: Spinal Brace Spinal Brace: Lumbar corset;Applied in sitting position Restrictions Weight Bearing Restrictions Per Provider Order: No     Mobility  Bed Mobility Overal bed mobility: Needs Assistance Bed Mobility: Rolling, Sit to Sidelying Rolling: Contact guard assist        Sit to sidelying: Min assist, Used rails General bed mobility comments: cues for logroll technique and assist for LLE elevation to EOB    Transfers Overall transfer level: Needs assistance Equipment used: Rolling walker (2 wheels) Transfers: Sit to/from Stand Sit to Stand: Contact guard assist           General transfer comment: From recliner and EOB with CGA for safety. Pt reliant on BUE support for power up    Ambulation/Gait Ambulation/Gait assistance: Contact guard assist Gait Distance (Feet): 115 Feet Assistive device: Rolling walker (2 wheels) Gait Pattern/deviations: Step-through pattern, Decreased stride length, Trunk flexed Gait velocity: decreased     General Gait Details: slow gait with low foot clearance. cues for RW proximity and upright posture with pt improving self-correction with increased distance   Stairs             Wheelchair Mobility     Tilt Bed    Modified Rankin (Stroke Patients Only)       Balance Overall balance assessment: Needs assistance Sitting-balance support: Feet supported, No upper extremity supported Sitting balance-Leahy Scale: Fair Sitting balance - Comments: EOB with slight posterior lean during exercises without UE support, but no LOB   Standing balance support: Bilateral upper extremity supported, During functional activity, Reliant on assistive device for balance Standing balance-Leahy Scale: Poor Standing balance comment: reliant on RW                            Communication Communication Communication: No apparent difficulties  Cognition Arousal: Alert Behavior During Therapy: WFL for tasks assessed/performed   PT - Cognitive impairments: Memory, Safety/Judgement  Following commands: Intact Following commands impaired: Follows one step commands with increased time    Cueing Cueing Techniques: Verbal cues, Visual cues  Exercises General Exercises - Lower  Extremity Long Arc Quad: Both, 10 reps, Seated, Strengthening, AROM Heel Slides: AROM, Supine, Both, 5 reps    General Comments        Pertinent Vitals/Pain Pain Assessment Pain Assessment: Faces Faces Pain Scale: Hurts a little bit Pain Location: back Pain Descriptors / Indicators: Sore, Grimacing, Guarding Pain Intervention(s): Limited activity within patient's tolerance, Monitored during session, Repositioned     PT Goals (current goals can now be found in the care plan section) Acute Rehab PT Goals Patient Stated Goal: home PT Goal Formulation: With patient/family Time For Goal Achievement: 11/13/24 Progress towards PT goals: Progressing toward goals    Frequency    Min 5X/week       AM-PAC PT 6 Clicks Mobility   Outcome Measure  Help needed turning from your back to your side while in a flat bed without using bedrails?: A Little Help needed moving from lying on your back to sitting on the side of a flat bed without using bedrails?: A Little Help needed moving to and from a bed to a chair (including a wheelchair)?: A Little Help needed standing up from a chair using your arms (e.g., wheelchair or bedside chair)?: A Little Help needed to walk in hospital room?: A Little Help needed climbing 3-5 steps with a railing? : A Lot 6 Click Score: 17    End of Session Equipment Utilized During Treatment: Gait belt;Back brace Activity Tolerance: Patient tolerated treatment well Patient left: in bed;with call bell/phone within reach;with bed alarm set Nurse Communication: Mobility status PT Visit Diagnosis: Unsteadiness on feet (R26.81);Other abnormalities of gait and mobility (R26.89);Muscle weakness (generalized) (M62.81);Repeated falls (R29.6);History of falling (Z91.81);Difficulty in walking, not elsewhere classified (R26.2);Pain     Time: 1342-1401 PT Time Calculation (min) (ACUTE ONLY): 19 min  Charges:    $Gait Training: 8-22 mins PT General Charges $$  ACUTE PT VISIT: 1 Visit                    Darryle George, PTA Acute Rehabilitation Services Secure Chat Preferred  [phone]    Darryle George 11/03/2024, 2:35 PM

## 2024-11-04 LAB — GLUCOSE, CAPILLARY
Glucose-Capillary: 114 mg/dL — ABNORMAL HIGH (ref 70–99)
Glucose-Capillary: 110 mg/dL — ABNORMAL HIGH (ref 70–99)
Glucose-Capillary: 116 mg/dL — ABNORMAL HIGH (ref 70–99)
Glucose-Capillary: 141 mg/dL — ABNORMAL HIGH (ref 70–99)

## 2024-11-04 NOTE — Progress Notes (Signed)
 Occupational Therapy Treatment Patient Details Name: Steve Hunter MRN: [id number] DOB: 02/27/1946 Today's Date: 11/04/2024   History of present illness 79 y.o. male admitted 1/8 for elective lumbar surgery. S/p L2-3, L3-4 lumbar decompression and discectomy fusion on 1/8. PMH:  former smoker (quit 07/02/80), HTN, HLD, CKD 3, GERD on PPI, non-insulin -dependent DM2, OSA (s/p uvulectomy; does not use CPAP), bladder cancer (s/p TURBT 2001; s/p Urolift and bladder biopsy/fulguration 07/18/22), anxiety, chronic back pain, spinal surgery (L4-S1 PLIF 04/10/16; C3-4 ACDF 03/21/23).   OT comments  Patient up in recliner and agreeable to OT session. Patient able to donn t-shirt with setup and min assist for back brace. Patient donned pants without AE and adhering to back precautions with mod assist. Patient able to perform grooming at sink with cues for back precautions. Patient will benefit from continued inpatient follow up therapy, <3 hours/day.  Acute OT to continue to follow to address established goals to facilitate DC to next venue of care.        If plan is discharge home, recommend the following:  A little help with walking and/or transfers;A lot of help with bathing/dressing/bathroom;Assistance with cooking/housework;Direct supervision/assist for medications management;Direct supervision/assist for financial management;Assist for transportation;Help with stairs or ramp for entrance   Equipment Recommendations  BSC/3in1    Recommendations for Other Services      Precautions / Restrictions Precautions Precautions: Back;Fall Precaution Booklet Issued: Yes (comment) Recall of Precautions/Restrictions: Impaired Precaution/Restrictions Comments: reviewed precautions Required Braces or Orthoses: Spinal Brace Spinal Brace: Lumbar corset;Applied in sitting position Restrictions Weight Bearing Restrictions Per Provider Order: No       Mobility Bed Mobility Overal bed mobility: Needs  Assistance             General bed mobility comments: OOB in recliner    Transfers Overall transfer level: Needs assistance Equipment used: Rolling walker (2 wheels) Transfers: Sit to/from Stand Sit to Stand: Contact guard assist           General transfer comment: cues for hand placement     Balance Overall balance assessment: Needs assistance Sitting-balance support: Feet supported, No upper extremity supported Sitting balance-Leahy Scale: Fair     Standing balance support: Single extremity supported, Bilateral upper extremity supported, During functional activity Standing balance-Leahy Scale: Poor Standing balance comment: able to stand with one extremity support during grooming and pulling up clothing                           ADL either performed or assessed with clinical judgement   ADL Overall ADL's : Needs assistance/impaired     Grooming: Wash/dry hands;Wash/dry face;Oral care;Brushing hair;Supervision/safety;Standing;Cueing for compensatory techniques Grooming Details (indicate cue type and reason): cues for compensation techniques and back precautions         Upper Body Dressing : Minimal assistance;Sitting Upper Body Dressing Details (indicate cue type and reason): able to donn t-shirt with setup and min assist with back brace Lower Body Dressing: Moderate assistance;Cueing for compensatory techniques;Sit to/from stand Lower Body Dressing Details (indicate cue type and reason): donned pants without AE and figure 4 with mod assist to thread legs into clothing and patient able to pull up while standing               General ADL Comments: cues for back precautions during self care tasks    Extremity/Trunk Assessment              Vision  Perception     Praxis     Communication Communication Communication: No apparent difficulties   Cognition Arousal: Alert Behavior During Therapy: WFL for tasks  assessed/performed Cognition: No apparent impairments             OT - Cognition Comments: cognition improving, able to recall back precautions and aware of discharge plans                 Following commands: Intact Following commands impaired: Follows one step commands with increased time      Cueing   Cueing Techniques: Verbal cues, Visual cues  Exercises      Shoulder Instructions       General Comments      Pertinent Vitals/ Pain       Pain Assessment Pain Assessment: Faces Faces Pain Scale: Hurts a little bit Pain Location: back Pain Descriptors / Indicators: Sore, Grimacing, Guarding Pain Intervention(s): Limited activity within patient's tolerance, Monitored during session, Repositioned  Home Living                                          Prior Functioning/Environment              Frequency  Min 2X/week        Progress Toward Goals  OT Goals(current goals can now be found in the care plan section)  Progress towards OT goals: Progressing toward goals  Acute Rehab OT Goals Patient Stated Goal: to go home OT Goal Formulation: With patient Time For Goal Achievement: 11/13/24 Potential to Achieve Goals: Good ADL Goals Pt Will Perform Grooming: standing;with set-up (adhering to back precautions) Pt Will Perform Upper Body Bathing: with supervision;with contact guard assist;sitting (adhering to back precautions) Pt Will Perform Lower Body Bathing: with contact guard assist;sit to/from stand (adhering to back precautions) Pt Will Perform Upper Body Dressing: with supervision;with set-up;sitting (adhering to back precautions) Pt Will Transfer to Toilet: with supervision;ambulating;bedside commode (with least restrictive AD; adhering to back precautions) Pt Will Perform Toileting - Clothing Manipulation and hygiene: with contact guard assist;sit to/from stand (adhering to back precautions)  Plan      Co-evaluation                  AM-PAC OT 6 Clicks Daily Activity     Outcome Measure   Help from another person eating meals?: A Little Help from another person taking care of personal grooming?: A Little Help from another person toileting, which includes using toliet, bedpan, or urinal?: A Little Help from another person bathing (including washing, rinsing, drying)?: A Lot Help from another person to put on and taking off regular upper body clothing?: A Little Help from another person to put on and taking off regular lower body clothing?: A Lot 6 Click Score: 16    End of Session Equipment Utilized During Treatment: Gait belt;Rolling walker (2 wheels);Back brace  OT Visit Diagnosis: Other abnormalities of gait and mobility (R26.89);Unsteadiness on feet (R26.81);History of falling (Z91.81);Other symptoms and signs involving cognitive function   Activity Tolerance Patient tolerated treatment well   Patient Left in chair;with call bell/phone within reach   Nurse Communication Mobility status;Precautions        Time: 9078-9051 OT Time Calculation (min): 27 min  Charges: OT General Charges $OT Visit: 1 Visit OT Treatments $Self Care/Home Management : 23-37 mins  Dick Laine, OTA Acute Rehabilitation Services  Office [phone]   Jeb LITTIE Laine 11/04/2024, 10:55 AM

## 2024-11-04 NOTE — TOC Progression Note (Signed)
 Transition of Care [facility]) - Progression Note    Patient Details  Name: Steve Hunter MRN: [id number] Date of Birth: 09/27/1946  [facility] of [facility]) CM/SW Contact  [name], [place] Phone Number: 11/04/2024, 2:38 PM  Clinical Narrative:   CSW contacted by [facility] for update. Patient's insurance remains pending at this time. Update provided to [facility]. CSW to follow.    Expected Discharge Plan: Skilled Nursing Facility Barriers to Discharge: Insurance Authorization               Expected Discharge Plan and Services       Living arrangements for the past 2 months: Single Family Home Expected Discharge Date: 10/31/24                                     Social Drivers of Health (SDOH) Interventions SDOH Screenings   Food Insecurity: No Food Insecurity (10/29/2024)  Housing: Low Risk (10/29/2024)  Transportation Needs: No Transportation Needs (10/29/2024)  Utilities: Not At Risk (10/29/2024)  Financial Resource Strain: Low Risk (11/06/2022)   Received from [facility]  Social Connections: Unknown (10/29/2024)  Stress: No Stress Concern Present (03/27/2024)   Received from [facility]  Tobacco Use: Medium Risk (10/30/2024)    Readmission Risk Interventions    11/02/2024   11:05 AM  Readmission Risk Prevention Plan  Post Dischage Appt Complete  Medication Screening Complete  Transportation Screening Complete

## 2024-11-04 NOTE — TOC Progression Note (Signed)
 Transition of Care [facility]) - Progression Note    Patient Details  Name: Steve Hunter MRN: [id number] Date of Birth: 1946-06-25  [facility]) CM/SW Contact  Sherline Clack, [place] Phone Number: 11/04/2024, 9:49 AM  Clinical Narrative:     Patient's insurance authorization for [facility] is pending at this time. Auth ID: [id number]. CSW will continue to monitor authorization status and update DC plan.   Expected Discharge Plan: Skilled Nursing Facility Barriers to Discharge: Insurance Authorization               Expected Discharge Plan and Services       Living arrangements for the past 2 months: Single Family Home Expected Discharge Date: 10/31/24                                     Social Drivers of Health (SDOH) Interventions SDOH Screenings   Food Insecurity: No Food Insecurity (10/29/2024)  Housing: Low Risk (10/29/2024)  Transportation Needs: No Transportation Needs (10/29/2024)  Utilities: Not At Risk (10/29/2024)  Financial Resource Strain: Low Risk (11/06/2022)   Received from [facility]  Social Connections: Unknown (10/29/2024)  Stress: No Stress Concern Present (03/27/2024)   Received from [facility]  Tobacco Use: Medium Risk (10/30/2024)    Readmission Risk Interventions    11/02/2024   11:05 AM  Readmission Risk Prevention Plan  Post Dischage Appt Complete  Medication Screening Complete  Transportation Screening Complete

## 2024-11-04 NOTE — Plan of Care (Signed)
  Problem: Health Behavior/Discharge Planning: Goal: Ability to manage health-related needs will improve Outcome: Progressing   Problem: Coping: Goal: Level of anxiety will decrease Outcome: Progressing   Problem: Elimination: Goal: Will not experience complications related to bowel motility Outcome: Progressing   

## 2024-11-04 NOTE — Progress Notes (Signed)
 Physical Therapy Treatment Patient Details Name: Steve Hunter MRN: [id number] DOB: 04-Sep-1946 Today's Date: 11/04/2024   History of Present Illness 79 y.o. male admitted 1/8 for elective lumbar surgery. S/p L2-3, L3-4 lumbar decompression and discectomy fusion on 1/8. PMH:  former smoker (quit 07/02/80), HTN, HLD, CKD 3, GERD on PPI, non-insulin -dependent DM2, OSA (s/p uvulectomy; does not use CPAP), bladder cancer (s/p TURBT 2001; s/p Urolift and bladder biopsy/fulguration 07/18/22), anxiety, chronic back pain, spinal surgery (L4-S1 PLIF 04/10/16; C3-4 ACDF 03/21/23).    PT Comments  Pt received in the recliner and agreeable to session. Pt able to increase gait distance and perform 2 stair trials with up to CGA for safety. Pt demonstrates fair stability with RW support and instructed pt on increased foot clearance to decrease risk of falls. Discussed home set up, safety, and activity progression. Pt verbalizes back precautions with min cues for lifting and is able to don brace with cues. Pt continues to benefit from PT services to progress toward functional mobility goals.    If plan is discharge home, recommend the following: A little help with walking and/or transfers;A little help with bathing/dressing/bathroom;Assistance with cooking/housework;Assist for transportation;Help with stairs or ramp for entrance   Can travel by private vehicle        Equipment Recommendations  None recommended by PT    Recommendations for Other Services       Precautions / Restrictions Precautions Precautions: Back;Fall Recall of Precautions/Restrictions: Impaired Precaution/Restrictions Comments: reviewed precautions Required Braces or Orthoses: Spinal Brace Spinal Brace: Lumbar corset;Applied in sitting position Restrictions Weight Bearing Restrictions Per Provider Order: No     Mobility  Bed Mobility               General bed mobility comments: OOB in recliner    Transfers Overall  transfer level: Needs assistance Equipment used: Rolling walker (2 wheels) Transfers: Sit to/from Stand Sit to Stand: Supervision           General transfer comment: From recliner with supervision for safety    Ambulation/Gait Ambulation/Gait assistance: Supervision Gait Distance (Feet): 150 Feet Assistive device: Rolling walker (2 wheels) Gait Pattern/deviations: Step-through pattern, Decreased stride length, Trunk flexed Gait velocity: decreased     General Gait Details: slow gait with low foot clearance. cues for upright posture and RW proximity   Stairs Stairs: Yes Stairs assistance: Contact guard assist Stair Management: Alternating pattern, Two rails Number of Stairs: 3 (x2) General stair comments: Pt ascended forward and descended backwards with CGA for safety, but no LOB   Wheelchair Mobility     Tilt Bed    Modified Rankin (Stroke Patients Only)       Balance Overall balance assessment: Needs assistance Sitting-balance support: Feet supported, No upper extremity supported Sitting balance-Leahy Scale: Fair     Standing balance support: Bilateral upper extremity supported, During functional activity Standing balance-Leahy Scale: Poor Standing balance comment: with RW support                            Communication Communication Communication: No apparent difficulties  Cognition Arousal: Alert Behavior During Therapy: WFL for tasks assessed/performed   PT - Cognitive impairments: Memory, Safety/Judgement                         Following commands: Intact Following commands impaired: Follows one step commands with increased time, Only follows one step commands consistently    Cueing  Cueing Techniques: Verbal cues, Visual cues  Exercises      General Comments        Pertinent Vitals/Pain Pain Assessment Pain Assessment: Faces Faces Pain Scale: Hurts little more Pain Location: back Pain Descriptors / Indicators:  Sore, Guarding Pain Intervention(s): Monitored during session, Repositioned     PT Goals (current goals can now be found in the care plan section) Acute Rehab PT Goals Patient Stated Goal: home PT Goal Formulation: With patient/family Time For Goal Achievement: 11/13/24 Progress towards PT goals: Progressing toward goals    Frequency    Min 5X/week       AM-PAC PT 6 Clicks Mobility   Outcome Measure  Help needed turning from your back to your side while in a flat bed without using bedrails?: A Little Help needed moving from lying on your back to sitting on the side of a flat bed without using bedrails?: A Little Help needed moving to and from a bed to a chair (including a wheelchair)?: A Little Help needed standing up from a chair using your arms (e.g., wheelchair or bedside chair)?: A Little Help needed to walk in hospital room?: A Little Help needed climbing 3-5 steps with a railing? : A Little 6 Click Score: 18    End of Session Equipment Utilized During Treatment: Gait belt;Back brace Activity Tolerance: Patient tolerated treatment well Patient left: with call bell/phone within reach;in chair Nurse Communication: Mobility status PT Visit Diagnosis: Unsteadiness on feet (R26.81);Other abnormalities of gait and mobility (R26.89);Muscle weakness (generalized) (M62.81);Repeated falls (R29.6);History of falling (Z91.81);Difficulty in walking, not elsewhere classified (R26.2);Pain     Time: 1521-1540 PT Time Calculation (min) (ACUTE ONLY): 19 min  Charges:    $Gait Training: 8-22 mins PT General Charges $$ ACUTE PT VISIT: 1 Visit                    Darryle George, PTA Acute Rehabilitation Services Secure Chat Preferred  [phone]    Darryle George 11/04/2024, 3:57 PM

## 2024-11-04 NOTE — Progress Notes (Signed)
" ° ° °  Subjective: Procedures (LRB): LUMBAR TWO TO LUMBAR THREE AND LUMBAR THREE TO LUMBAR FOUR DECOMPRESSION AND DISCECTOMY WITH IN SITU FUSION (N/A) 6 Days Post-Op  Patient reports pain as 2 on 0-10 scale.  Reports decreased leg pain reports incisional back pain  but patient states that it is improving Positive void into suprapubic cath  Positive bowel movement Positive flatus Negative chest pain or shortness of breath  Objective: Vital signs in last 24 hours: Temp:  [97.7 F (36.5 C)-98.6 F (37 C)] 98.6 F (37 C) (01/14 0458) Pulse Rate:  [85-101] 90 (01/14 0458) Resp:  [17-18] 18 (01/14 0458) BP: (131-148)/(72-96) 143/72 (01/14 0458) SpO2:  [96 %-100 %] 98 % (01/14 0458)  Intake/Output from previous day: 01/13 0701 - 01/14 0700 In: 240 [P.O.:240] Out: 2700 [Urine:2700]  Labs: No results for input(s): WBC, RBC, HCT, PLT in the last 72 hours. No results for input(s): NA, K, CL, CO2, BUN, CREATININE, GLUCOSE, CALCIUM  in the last 72 hours. No results for input(s): LABPT, INR in the last 72 hours.  Physical Exam: Neurologically intact ABD soft Sensation intact distally Dorsiflexion/Plantar flexion intact Incision: dressing C/D/I No cellulitis present Compartment soft Body mass index is 23.09 kg/m.   Assessment/Plan: Patient stable  Continue mobilization with physical therapy Continue care  Patient A+O x3 Plan to d/c to SNF   Khaleesi Gruel Candance PA-C for Dr. Donaciano Sprang, MD [facility] [phone]  "

## 2024-11-05 LAB — GLUCOSE, CAPILLARY: Glucose-Capillary: 123 mg/dL — ABNORMAL HIGH (ref 70–99)

## 2024-11-05 NOTE — TOC Progression Note (Signed)
 Transition of Care [facility]) - Progression Note    Patient Details  Name: Steve Hunter MRN: [id number] Date of Birth: 27-Jul-1946  Transition of Care [facility]) CM/SW Contact  [name], [place] Phone Number: 11/05/2024, 9:57 AM  Clinical Narrative:   Patient's insurance offering peer to peer. Information relayed to RN, but patient may discharge home as he has improved; awaiting MD and family discussion. Update provided to [facility]. CSW to follow.    Expected Discharge Plan: Skilled Nursing Facility Barriers to Discharge: Insurance Authorization               Expected Discharge Plan and Services       Living arrangements for the past 2 months: Single Family Home Expected Discharge Date: 10/31/24                                     Social Drivers of Health (SDOH) Interventions SDOH Screenings   Food Insecurity: No Food Insecurity (10/29/2024)  Housing: Low Risk (10/29/2024)  Transportation Needs: No Transportation Needs (10/29/2024)  Utilities: Not At Risk (10/29/2024)  Financial Resource Strain: Low Risk (11/06/2022)   Received from [facility]  Social Connections: Unknown (10/29/2024)  Stress: No Stress Concern Present (03/27/2024)   Received from [facility]  Tobacco Use: Medium Risk (10/30/2024)    Readmission Risk Interventions    11/02/2024   11:05 AM  Readmission Risk Prevention Plan  Post Dischage Appt Complete  Medication Screening Complete  Transportation Screening Complete

## 2024-11-05 NOTE — Progress Notes (Signed)
 Patient alert and oriented, ambulated, voided. Surgical site clean and dry no sign of infection, d/c instructions explain and given to the patient and family, all questions answered. Patient foley change to leg bag. Patient d/c home via car with daughter.

## 2024-11-05 NOTE — Progress Notes (Signed)
" ° ° °  Subjective: Procedures (LRB): LUMBAR TWO TO LUMBAR THREE AND LUMBAR THREE TO LUMBAR FOUR DECOMPRESSION AND DISCECTOMY WITH IN SITU FUSION (N/A) 7 Days Post-Op  Patient reports pain as 2 on 0-10 scale.  Reports decreased leg pain reports incisional back pain  but patient states that it is improving Positive void into suprapubic cath  Positive bowel movement Positive flatus Negative chest pain or shortness of breath  Objective: Vital signs in last 24 hours: Temp:  [97.7 F (36.5 C)-98.3 F (36.8 C)] 98.1 F (36.7 C) (01/15 0339) Pulse Rate:  [87-93] 87 (01/15 0339) Resp:  [18] 18 (01/15 0339) BP: (117-147)/(76-85) 147/76 (01/15 0339) SpO2:  [97 %-99 %] 99 % (01/15 0339)  Intake/Output from previous day: 01/14 0701 - 01/15 0700 In: 720 [P.O.:720] Out: 2200 [Urine:2200]  Labs: No results for input(s): WBC, RBC, HCT, PLT in the last 72 hours. No results for input(s): NA, K, CL, CO2, BUN, CREATININE, GLUCOSE, CALCIUM  in the last 72 hours. No results for input(s): LABPT, INR in the last 72 hours.  Physical Exam: Neurologically intact ABD soft Sensation intact distally Dorsiflexion/Plantar flexion intact Incision: dressing C/D/I No cellulitis present Compartment soft Body mass index is 23.09 kg/m.   Assessment/Plan: Patient stable  Continue mobilization with physical therapy Continue care  Patient A+O x3 Patient ambulating on his own with walker and doing very well  Plan to possibly d/c to home today instead of SNF; will discuss with family.   Jeoffrey Sages PA-C for Dr. Donaciano Sprang, MD [facility] [phone]  "
